# Patient Record
Sex: Female | Born: 1950 | Race: White | Hispanic: No | Marital: Married
Health system: Midwestern US, Community
[De-identification: ages and names within clinical notes are randomized; demographics above are authoritative.]

## PROBLEM LIST (undated history)

## (undated) ENCOUNTER — Ambulatory Visit: Admission: EM

## (undated) DIAGNOSIS — F419 Anxiety disorder, unspecified: Secondary | ICD-10-CM

## (undated) DIAGNOSIS — C801 Malignant (primary) neoplasm, unspecified: Secondary | ICD-10-CM

## (undated) DIAGNOSIS — F32A Depression, unspecified: Secondary | ICD-10-CM

## (undated) DIAGNOSIS — J82 Pulmonary eosinophilia, not elsewhere classified: Secondary | ICD-10-CM

## (undated) DIAGNOSIS — F329 Major depressive disorder, single episode, unspecified: Secondary | ICD-10-CM

## (undated) DIAGNOSIS — M1990 Unspecified osteoarthritis, unspecified site: Secondary | ICD-10-CM

## (undated) DIAGNOSIS — J45909 Unspecified asthma, uncomplicated: Secondary | ICD-10-CM

## (undated) DIAGNOSIS — I719 Aortic aneurysm of unspecified site, without rupture: Secondary | ICD-10-CM

## (undated) DIAGNOSIS — J8281 Chronic eosinophilic pneumonia: Secondary | ICD-10-CM

## (undated) DIAGNOSIS — F3342 Major depressive disorder, recurrent, in full remission: Secondary | ICD-10-CM

## (undated) DIAGNOSIS — E538 Deficiency of other specified B group vitamins: Secondary | ICD-10-CM

## (undated) DIAGNOSIS — F411 Generalized anxiety disorder: Secondary | ICD-10-CM

## (undated) DIAGNOSIS — M545 Low back pain, unspecified: Secondary | ICD-10-CM

## (undated) DIAGNOSIS — Z780 Asymptomatic menopausal state: Secondary | ICD-10-CM

## (undated) HISTORY — PX: ENDOMETRIAL ABLATION: SHX621

## (undated) HISTORY — PX: TONSILLECTOMY: SUR1361

---

## 2014-01-06 ENCOUNTER — Encounter

## 2015-05-08 ENCOUNTER — Encounter: Admit: 2015-05-09 | Discharge: 2015-05-09 | Payer: MEDICARE | Primary: Specialist

## 2015-05-08 ENCOUNTER — Encounter: Admit: 2015-05-08 | Discharge: 2015-05-09 | Payer: MEDICARE | Primary: Specialist

## 2015-05-09 ENCOUNTER — Encounter: Primary: Specialist

## 2015-05-10 ENCOUNTER — Encounter: Admit: 2015-05-10 | Discharge: 2015-05-10 | Payer: MEDICARE | Primary: Specialist

## 2015-05-10 ENCOUNTER — Encounter: Primary: Specialist

## 2015-05-11 ENCOUNTER — Encounter: Admit: 2015-05-11 | Discharge: 2015-05-11 | Payer: MEDICARE | Primary: Specialist

## 2015-05-19 ENCOUNTER — Encounter: Admit: 2015-05-19 | Discharge: 2015-05-19 | Payer: MEDICARE | Primary: Specialist

## 2015-05-26 ENCOUNTER — Encounter: Primary: Specialist

## 2015-05-27 ENCOUNTER — Encounter: Admit: 2015-05-27 | Discharge: 2015-05-27 | Payer: MEDICARE | Primary: Specialist

## 2015-05-31 ENCOUNTER — Encounter: Admit: 2015-05-31 | Discharge: 2015-05-31 | Payer: MEDICARE | Primary: Specialist

## 2015-06-04 ENCOUNTER — Encounter: Primary: Specialist

## 2015-06-07 ENCOUNTER — Encounter: Primary: Specialist

## 2015-06-08 ENCOUNTER — Encounter: Admit: 2015-06-08 | Discharge: 2015-06-08 | Payer: MEDICARE | Primary: Specialist

## 2015-06-12 ENCOUNTER — Encounter: Admit: 2015-06-12 | Discharge: 2015-06-13 | Payer: MEDICARE | Primary: Specialist

## 2015-06-12 DIAGNOSIS — I7102 Dissection of abdominal aorta: Secondary | ICD-10-CM

## 2015-06-13 ENCOUNTER — Inpatient Hospital Stay: Admit: 2015-06-13 | Payer: MEDICARE | Primary: Specialist

## 2015-06-13 LAB — URINALYSIS W/ RFLX MICROSCOPIC
Bilirubin: NEGATIVE
Blood: NEGATIVE
Glucose: NEGATIVE mg/dL
Ketone: NEGATIVE mg/dL
Leukocyte Esterase: NEGATIVE
Nitrites: NEGATIVE
Protein: NEGATIVE mg/dL
Specific gravity: 1.009 (ref 1.001–1.023)
Urobilinogen: 1 EU/dL (ref 0.2–1.0)
pH (UA): 7 (ref 5.0–9.0)

## 2015-06-14 ENCOUNTER — Encounter: Admit: 2015-06-14 | Discharge: 2015-06-14 | Payer: MEDICARE | Primary: Specialist

## 2015-06-14 ENCOUNTER — Encounter: Attending: Specialist | Primary: Specialist

## 2015-06-14 LAB — CULTURE, URINE
Culture result:: >100000
Culture: >100000

## 2015-06-22 ENCOUNTER — Encounter: Admit: 2015-06-22 | Discharge: 2015-06-22 | Payer: MEDICARE | Primary: Specialist

## 2015-06-29 ENCOUNTER — Encounter: Admit: 2015-06-29 | Discharge: 2015-06-29 | Payer: MEDICARE | Primary: Specialist

## 2015-07-06 ENCOUNTER — Encounter: Admit: 2015-07-06 | Discharge: 2015-07-06 | Payer: MEDICARE | Primary: Specialist

## 2015-07-15 ENCOUNTER — Ambulatory Visit
Admit: 2015-07-15 | Discharge: 2015-07-15 | Payer: PRIVATE HEALTH INSURANCE | Attending: Specialist | Primary: Specialist

## 2015-07-15 DIAGNOSIS — I71019 Dissection of thoracic aorta, unspecified: Secondary | ICD-10-CM

## 2015-07-15 LAB — AMB POC COMPLETE CBC,AUTOMATED ENTER
ABS. GRANS (POC): 4.4 10*3/uL (ref 1.4–6.5)
ABS. LYMPHS (POC): 1.4 10*3/uL (ref 1.2–3.4)
ABS. MONOS (POC): 0.3 10*3/uL (ref 0.1–0.6)
GRANULOCYTES (POC): 71.8 % (ref 42.2–75.2)
HCT (POC): 37.9 % (ref 35–60)
HGB (POC): 11 g/dL (ref 11–18)
LYMPHOCYTES (POC): 23.2 % (ref 20.5–51.1)
MCH (POC): 24.7 pg — AB (ref 27–31)
MCHC (POC): 29 g/dL — AB (ref 33–37)
MCV (POC): 85.3 fL (ref 80–99.9)
MONOCYTES (POC): 5 % (ref 1.7–9.3)
MPV (POC): 8 fL (ref 7.8–11)
PLATELET (POC): 305 10*3/uL (ref 150–450)
RBC (POC): 4.44 10*6/uL (ref 4–6)
RDW (POC): 16.4 % — AB (ref 11.6–13.7)
WBC (POC): 6.1 10*3/uL (ref 4.5–10.5)

## 2015-07-15 LAB — AMB POC URINALYSIS DIP STICK AUTO W/O MICRO
Blood (UA POC): NEGATIVE
Creatinine: 300 mg/dL
Glucose (UA POC): NEGATIVE mg/dL
Leukocyte esterase (UA POC): NEGATIVE
Nitrites (UA POC): NEGATIVE
Protein (UA POC): 30
Protein Total Urine: NEGATIVE
Protein-Creat Ratio: ABNORMAL mg/g
Specific gravity (UA POC): 1.02 (ref 1.001–1.035)
Urobilinogen (UA POC): 0.2 (ref 0.2–1)
pH (UA POC): 6 (ref 4.6–8.0)

## 2015-07-15 MED ORDER — FLUOXETINE 40 MG CAP
40 mg | ORAL_CAPSULE | Freq: Every day | ORAL | 3 refills | Status: DC
Start: 2015-07-15 — End: 2016-06-21

## 2015-07-15 MED ORDER — MONTELUKAST 10 MG TAB
10 mg | ORAL_TABLET | Freq: Every day | ORAL | 3 refills | Status: DC
Start: 2015-07-15 — End: 2016-07-06

## 2015-07-15 MED ORDER — CYANOCOBALAMIN (VIT B-12) 1,000 MCG/ML INJECTION KIT
1000 mcg/mL | INTRAMUSCULAR | 1 refills | Status: DC
Start: 2015-07-15 — End: 2016-01-18

## 2015-07-15 MED ORDER — METOPROLOL TARTRATE 25 MG TAB
25 mg | ORAL_TABLET | Freq: Two times a day (BID) | ORAL | 3 refills | Status: DC
Start: 2015-07-15 — End: 2015-09-20

## 2015-07-15 MED ORDER — DOXYCYCLINE 100 MG CAP
100 mg | ORAL_CAPSULE | Freq: Two times a day (BID) | ORAL | 0 refills | Status: DC
Start: 2015-07-15 — End: 2015-07-21

## 2015-07-15 MED ORDER — METHYLPREDNISOLONE 4 MG TABS IN A DOSE PACK
4 mg | ORAL | 0 refills | Status: DC
Start: 2015-07-15 — End: 2015-07-21

## 2015-07-15 NOTE — Progress Notes (Signed)
[facility]  Suzanne Pham, M.D.  Suzanne Pham, M.D.  Suzanne Pham, Suzanne Pham  Ph No:  [phone]  Fax:  [phone]      Chief Complaint   Patient presents with   ??? New Patient       History of Present Illness:  [name] is a 65 y.o. 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, renal involvement, stable hypertension, tolerating beta-blocker well, patient has constant pain, left anterolateral part of the ribs, previous history of rib fractures during chiropractor manipulation few years ago, patient on MS Contin for pain, reviewed the records from [facility], repeat CTA done twice showed stable completed dissection extending to the left common iliacs,, rib x-rays unremarkable, no evidence of previous fractures, patient would like to stop the pain medication,    Patient has no symptoms of chest pain, shortness of breath, strong family history of heart disease, requesting to get cardiac workup,    History of GAD/depression, stable, tolerating fluoxetine well, using Xanax on as needed basis,     history of asthma, patient has symptoms of cough, wheezing, shortness of breath, for the past few weeks, denies any fever, chills, productive sputum,    GERD/chronic constipation, patient on Colace, Zantac, denies any abdominal pain at this time.    Patient has documented B12 deficiency, on B12 supplementation,    Allergies   Allergen Reactions   ??? Aspirin Other (comments)     "Flu-like symptoms"     Past Medical History   Diagnosis Date   ??? Aorta disorder (HCC)      TYPE B   ??? Arthritis    ??? Asthma    ??? Chronic pain      Back   ??? Heart disease    ??? Osteoporosis    ??? Rib fractures      2009   ??? Vitamin B 12 deficiency    ??? Vitamin D deficiency      Past Surgical History   Procedure Laterality Date    ??? Hx bunionectomy     ??? Hx tonsil and adenoidectomy       Family History   Problem Relation Age of Onset   ??? Cancer Mother    ??? Cancer Father      Social History     Social History   ??? Marital status: MARRIED     Spouse name: N/A   ??? Number of children: N/A   ??? Years of education: N/A     Occupational History   ??? Not on file.     Social History Main Topics   ??? Smoking status: Never Smoker   ??? Smokeless tobacco: Not on file   ??? Alcohol use No   ??? Drug use: No   ??? Sexual activity: Not on file     Other Topics Concern   ??? Not on file     Social History Narrative   ??? No narrative on file     Current Outpatient Prescriptions   Medication Sig Dispense Refill   ??? methylPREDNISolone (MEDROL, PAK,) 4 mg tablet As directed 1 Dose Pack 0   ??? doxycycline (MONODOX) 100 mg capsule Take 1 Cap by mouth two (2) times a day for 10 days. 20 Cap 0   ??? metoprolol tartrate (LOPRESSOR) 25 mg tablet  Take 1 Tab by mouth two (2) times a day. Hold if BP < 100 or HR <55 180 Tab 3   ??? FLUoxetine (PROZAC) 40 mg capsule Take 1 Cap by mouth daily. 90 Cap 3   ??? montelukast (SINGULAIR) 10 mg tablet Take 1 Tab by mouth daily. 90 Tab 3   ??? cyanocobalamin, vitamin B-12, 1,000 mcg/mL kit 1 mL by Injection route every month. 30 mL 1   ??? raNITIdine (ZANTAC) 150 mg tablet Take 150 mg by mouth two (2) times a day.     ??? ALPRAZolam (XANAX) 0.5 mg tablet Take 1 mg by mouth every four (4) hours as needed for Anxiety or Sleep. pt may take 2 tabs = 1 mg at hs prn sleep or anxiety  Indications: ANXIETY     ??? morphine IR (MS IR) 15 mg tablet Take 15 mg by mouth two (2) times a day. 15 mg q 6 am   15 mg q 2 pm   and  30 mg q hs     ??? senna-docusate (PERICOLACE) 8.6-50 mg per tablet Take 2 Tabs by mouth two (2) times a day.     ??? fluticasone (FLONASE) 50 mcg/actuation nasal spray 2 Sprays by Both Nostrils route daily.     ??? budesonide-formoterol (SYMBICORT) 160-4.5 mcg/actuation HFA inhaler Take 2 Puffs by inhalation two (2) times a day.      ??? albuterol sulfate 90 mcg/actuation aepb Take 2 Puffs by inhalation every six (6) hours as needed (wheezing).           REVIEW OF SYSTEMS:  GENERAL: negative for - chills, fatigue, fever, , malaise, night sweats,  weight gain or weight loss.    EYES: negative for  - blurred vision, double vision, photophobia, pain, discharge and redness.    ENT AND MOUTH: negative for - epistaxis,   nasal discharge, nasal polyps, oral lesions, sinus pain,  sore throat, tinnitus, vertigo, visual changes or vocal changes.    CARDIOVASCULAR: negative for - chest pain, dyspnea on exertion, edema, irregular heartbeat, loss of consciousness    RESPIRATORY: Positive symptoms as above,    GASTROINTESTINAL: negative for - abdominal pain, appetite loss, blood in stools, change in bowel habits, , constipation, diarrhea, gas/bloating, heartburn, hematemesis,     GENITOURINARY: No change in urinary stream, dysuria,  genital discharge, genital ulcers, hematuria, incontinence, irregular/heavy menses, nocturia, pelvic pain, , urinary frequency/urgency or vulvar/vaginal symptoms. No flank pain.    NEUROLOGICAL: negative for - behavioral changes,   confusion, dizziness, gait disturbance, headaches, impaired coordination/balance, memory loss, numbness/tingling, seizures, speech problems, tremors, .  PSYCHIATRIC: negative for - anxiety, behavioral disorder, concentration difficulties, decreased libido, depression,  hallucinations, hostility, irritability, memory difficulties, mood swings, obsessive thoughts,     MUSCULOSKELETAL: Rib pain as above    INTEGUMENTARY (BREASTS): negative ,      ENDOCRINE: negative polydipsia, polyuria, polydypsia, cold-heat intolerance,weight loss,.    HEM/LYMPH:  bruising, bleeding, lymph node enlargement,     ALLERGY/IMMUNOLOGIC: negative,    SKIN: Negative .    PHYSICAL EXAM  General appearance - alert, well appearing, and in no distress    Mental status - alert, oriented to person, place, and time     Eyes - pupils equal and reactive, extraocular eye movements intact    Ears - bilateral TM's and external ear canals normal    Nose - normal and patent, no erythema, discharge or polyps    Neck - supple, no significant adenopathy, carotids upstroke normal bilaterally,  no bruits, thyroid exam: thyroid is normal in size without nodules or tenderness    Throat / Mouth - no erythema, no tonsillar exudate, normal dental hygiene, no ulcers    Chest -bilateral scattered wheezing, mildly tachypneic, tenderness anterolateral left ribs,    Heart - normal rate and regular rhythm, S1 and S2 normal, no gallops noted, no murmur    Abdomen - soft, nontender, nondistended, no masses or organomegaly    Back exam - full range of motion, no tenderness, palpable spasm or pain.    Neurological - alert, oriented, normal speech, no focal findings or movement disorder noted    Musculoskeletal -swelling and tenderness/first metacarpal carpal joint bilateral hand,    Extremities - peripheral pulses normal, no pedal edema, no clubbing or cyanosis    Skin - normal coloration and turgor, no rashes, no suspicious skin lesions noted      There is no immunization history on file for this patient.       LABS:   Results for orders placed or performed in visit on 37/10/62   METABOLIC PANEL, COMPREHENSIVE   Result Value Ref Range    Glucose 101 (H) 65 - 99 mg/dL    BUN 9 8 - 27 mg/dL    Creatinine 0.64 0.57 - 1.00 mg/dL    GFR est non-AA 96 >59 mL/min/1.73    GFR est AA 111 >59 mL/min/1.73    BUN/Creatinine ratio 14 11 - 26    Sodium 138 134 - 144 mmol/L    Potassium 4.0 3.5 - 5.2 mmol/L    Chloride 100 96 - 106 mmol/L    CO2 23 18 - 29 mmol/L    Calcium 9.3 8.7 - 10.3 mg/dL    Protein, total 6.3 6.0 - 8.5 g/dL    Albumin 3.8 3.6 - 4.8 g/dL    GLOBULIN, TOTAL 2.5 1.5 - 4.5 g/dL    A-G Ratio 1.5 1.1 - 2.5    Bilirubin, total 0.2 0.0 - 1.2 mg/dL    Alk. phosphatase 104 39 - 117 IU/L    AST 12 0 - 40 IU/L    ALT 11 0 - 32 IU/L   LIPID PANEL    Result Value Ref Range    Cholesterol, total 172 100 - 199 mg/dL    Triglyceride 166 (H) 0 - 149 mg/dL    HDL Cholesterol 32 (L) >39 mg/dL    VLDL, calculated 33 5 - 40 mg/dL    LDL, calculated 107 (H) 0 - 99 mg/dL   T4   Result Value Ref Range    T4, Total 8.9 4.5 - 12.0 ug/dL   TSH 3RD GENERATION   Result Value Ref Range    TSH 2.140 0.450 - 4.500 uIU/mL   AMB POC COMPLETE CBC,AUTOMATED ENTER   Result Value Ref Range    WBC (POC) 6.1 4.5 - 10.5 10^3/ul    LYMPHOCYTES (POC) 23.2 20.5 - 51.1 %    MONOCYTES (POC) 5.0 1.7 - 9.3 %    GRANULOCYTES (POC) 71.8 42.2 - 75.2 %    ABS. LYMPHS (POC) 1.4 1.2 - 3.4 10^3/ul    ABS. MONOS (POC) 0.3 0.1 - 0.6 10^3/ul    ABS. GRANS (POC) 4.4 1.4 - 6.5 10^3/ul    RBC (POC) 4.44 4 - 6 10^6/ul    HGB (POC) 11.0 11 - 18 g/dL    HCT (POC) 37.9 35 - 60 %    MCV (POC) 85.3 80 - 99.9 fL    MCH (  POC) 24.7 (A) 27 - 31 pg    MCHC (POC) 29.0 (A) 33 - 37 g/dL    RDW (POC) 16.4 (A) 11.6 - 13.7 %    PLATELET (POC) 305 150 - 450 10^3/ul    MPV (POC) 8.0 7.8 - 11 fL   AMB POC URINALYSIS DIP STICK AUTO W/O MICRO (CIM)   Result Value Ref Range    Color (UA POC) Yellow     Clarity (UA POC) Clear     Glucose (UA POC) Negative Negative mg/dL    Bilirubin (UA POC) Small Negative    Ketones (UA POC) Trace Negative    Specific gravity (UA POC) 1.020 1.001 - 1.035    Blood (UA POC) Negative Negative    pH (UA POC) 6.0 4.6 - 8.0    Protein (UA POC) 30  Negative    Urobilinogen (UA POC) 0.2 mg/dL 0.2 - 1    Nitrites (UA POC) Negative Negative    Leukocyte esterase (UA POC) Negative Negative    Protein Total Urine Negative     Creatinine 300 mg/dL    Protein-Creat Ratio abnormal mg/g         IMPRESSION    ICD-10-CM ICD-9-CM    1. Aortic dissection distal to left subclavian (HCC) I71.01 441.00 REFERRAL TO VASCULAR CENTER      AMB POC COMPLETE CBC,AUTOMATED ENTER      AMB POC URINALYSIS DIP STICK AUTO W/O MICRO (CIM)      COLLECTION VENOUS BLOOD,VENIPUNCTURE      METABOLIC PANEL, COMPREHENSIVE      LIPID PANEL       T4      TSH 3RD GENERATION   2. BMI 25.0-25.9,adult Z68.25 V85.21    3. Asthma, moderate J45.998 493.90    4. Major depressive disorder with single episode, in partial remission (HCC) F32.4 296.25 AMB POC COMPLETE CBC,AUTOMATED ENTER      AMB POC URINALYSIS DIP STICK AUTO W/O MICRO (CIM)      COLLECTION VENOUS BLOOD,VENIPUNCTURE      METABOLIC PANEL, COMPREHENSIVE      LIPID PANEL      T4      TSH 3RD GENERATION   5. Osteoporosis M81.0 733.00 AMB POC COMPLETE CBC,AUTOMATED ENTER      AMB POC URINALYSIS DIP STICK AUTO W/O MICRO (CIM)      COLLECTION VENOUS BLOOD,VENIPUNCTURE      METABOLIC PANEL, COMPREHENSIVE      LIPID PANEL      T4      TSH 3RD GENERATION   6. History of rib fracture Z87.81 V15.51    7. Vitamin B 12 deficiency E53.8 266.2    8. Chronic pain syndrome G89.4 338.4 AMB POC COMPLETE CBC,AUTOMATED ENTER      AMB POC URINALYSIS DIP STICK AUTO W/O MICRO (CIM)      COLLECTION VENOUS BLOOD,VENIPUNCTURE      METABOLIC PANEL, COMPREHENSIVE      LIPID PANEL      T4      TSH 3RD GENERATION   9. Arthralgia, unspecified joint M25.50 719.40 AMB POC COMPLETE CBC,AUTOMATED ENTER      AMB POC URINALYSIS DIP STICK AUTO W/O MICRO (CIM)      COLLECTION VENOUS BLOOD,VENIPUNCTURE      METABOLIC PANEL, COMPREHENSIVE      LIPID PANEL      T4      TSH 3RD GENERATION   10. Asthma exacerbation J45.901 493.92 methylPREDNISolone (MEDROL, PAK,) 4 mg tablet  doxycycline (MONODOX) 100 mg capsule      AMB POC COMPLETE CBC,AUTOMATED ENTER      AMB POC URINALYSIS DIP STICK AUTO W/O MICRO (CIM)      COLLECTION VENOUS BLOOD,VENIPUNCTURE      METABOLIC PANEL, COMPREHENSIVE      LIPID PANEL      T4      TSH 3RD GENERATION         PLAN  ?? 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  ??   ?? Continue fluoxetine/Xanax as directed.  ??   ?? Nonspecific rib pain, no evidence of fracture,  ??   ?? Osteopenia, no issue with compression fracture, will recheck bone density,  ??   ?? 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   ?? Continue Zantac/Colace as directed.     ??   ??       [id number] Theodoro Kos, MD

## 2015-07-16 LAB — LIPID PANEL
Cholesterol, total: 172 mg/dL (ref 100–199)
HDL Cholesterol: 32 mg/dL — ABNORMAL LOW (ref >39)
LDL, calculated: 107 mg/dL — ABNORMAL HIGH (ref 0–99)
Triglyceride: 166 mg/dL — ABNORMAL HIGH (ref 0–149)
VLDL, calculated: 33 mg/dL (ref 5–40)

## 2015-07-16 LAB — METABOLIC PANEL, COMPREHENSIVE
A-G Ratio: 1.5 (ref 1.1–2.5)
ALT (SGPT): 11 IU/L (ref 0–32)
AST (SGOT): 12 IU/L (ref 0–40)
Albumin: 3.8 g/dL (ref 3.6–4.8)
Alk. phosphatase: 104 IU/L (ref 39–117)
BUN/Creatinine ratio: 14 (ref 11–26)
BUN: 9 mg/dL (ref 8–27)
Bilirubin, total: 0.2 mg/dL (ref 0.0–1.2)
CO2: 23 mmol/L (ref 18–29)
Calcium: 9.3 mg/dL (ref 8.7–10.3)
Chloride: 100 mmol/L (ref 96–106)
Creatinine: 0.64 mg/dL (ref 0.57–1.00)
GFR est AA: 111 mL/min/{1.73_m2} (ref >59)
GFR est non-AA: 96 mL/min/{1.73_m2} (ref >59)
GLOBULIN, TOTAL: 2.5 g/dL (ref 1.5–4.5)
Glucose: 101 mg/dL — ABNORMAL HIGH (ref 65–99)
Potassium: 4 mmol/L (ref 3.5–5.2)
Protein, total: 6.3 g/dL (ref 6.0–8.5)
Sodium: 138 mmol/L (ref 134–144)

## 2015-07-16 LAB — T4 (THYROXINE): T4, Total: 8.9 ug/dL (ref 4.5–12.0)

## 2015-07-16 LAB — TSH 3RD GENERATION: TSH: 2.14 u[IU]/mL (ref 0.450–4.500)

## 2015-07-21 ENCOUNTER — Ambulatory Visit
Admit: 2015-07-21 | Discharge: 2015-07-21 | Payer: PRIVATE HEALTH INSURANCE | Attending: Specialist | Primary: Specialist

## 2015-07-21 DIAGNOSIS — J209 Acute bronchitis, unspecified: Secondary | ICD-10-CM

## 2015-07-21 MED ORDER — CEFTRIAXONE 1 GRAM SOLUTION FOR INJECTION
1 gram | Freq: Once | INTRAMUSCULAR | Status: AC
Start: 2015-07-21 — End: 2015-07-21
  Administered 2015-07-21: 17:00:00 via INTRAMUSCULAR

## 2015-07-21 MED ORDER — METHYLPREDNISOLONE 80 MG/ML SUSP FOR INJECTION
80 mg/mL | Freq: Once | INTRAMUSCULAR | Status: AC
Start: 2015-07-21 — End: 2015-07-21
  Administered 2015-07-21: 17:00:00 via INTRAMUSCULAR

## 2015-07-21 NOTE — Progress Notes (Signed)
[facility]  Campbell Riches, M.D.  Sudhirkumar C. Posey Pronto, M.D.  [place], [place]  Ph No:  [phone]  Fax:  [phone]      Chief Complaint   Patient presents with   ??? Cold Symptoms   ??? Croup       History of Present Illness:  Suzanne Pham is a 65 y.o. female that presents today for worsening of cough, wheezing, shortness of breath, patient on p.o. doxycycline, Symbicort, rescue inhalers, denies any fever, chills, positive for productive sputum, greenish in color,    History of Stanford B dissection, stable symptoms, patient has an appointment to see a physician at [facility], denies any abdominal pain, no chest pain, no shortness of breath, blood pressure is in the upper range of normal,    Stable GAD/depressive symptoms,    Allergies   Allergen Reactions   ??? Aspirin Other (comments)     "Flu-like symptoms"     Past Medical History   Diagnosis Date   ??? Aorta disorder (HCC)      TYPE B   ??? Arthritis    ??? Asthma    ??? Chronic pain      Back   ??? Heart disease    ??? Osteoporosis    ??? Rib fractures      2009   ??? Vitamin B 12 deficiency    ??? Vitamin D deficiency      Past Surgical History   Procedure Laterality Date   ??? Hx bunionectomy     ??? Hx tonsil and adenoidectomy       Family History   Problem Relation Age of Onset   ??? Cancer Mother    ??? Cancer Father      Social History     Social History   ??? Marital status: MARRIED     Spouse name: N/A   ??? Number of children: N/A   ??? Years of education: N/A     Occupational History   ??? Not on file.     Social History Main Topics   ??? Smoking status: Never Smoker   ??? Smokeless tobacco: Not on file   ??? Alcohol use No   ??? Drug use: No   ??? Sexual activity: Not on file     Other Topics Concern   ??? Not on file     Social History Narrative     Current Outpatient Prescriptions   Medication Sig Dispense Refill   ??? metoprolol tartrate (LOPRESSOR) 25 mg tablet Take 1 Tab by mouth two (2)  times a day. Hold if BP < 100 or HR <55 180 Tab 3   ??? FLUoxetine (PROZAC) 40 mg capsule Take 1 Cap by mouth daily. 90 Cap 3   ??? montelukast (SINGULAIR) 10 mg tablet Take 1 Tab by mouth daily. 90 Tab 3   ??? cyanocobalamin, vitamin B-12, 1,000 mcg/mL kit 1 mL by Injection route every month. 30 mL 1   ??? raNITIdine (ZANTAC) 150 mg tablet Take 150 mg by mouth two (2) times a day.     ??? ALPRAZolam (XANAX) 0.5 mg tablet Take 1 mg by mouth every four (4) hours as needed for Anxiety or Sleep. pt may take 2 tabs = 1 mg at hs prn sleep or anxiety  Indications: ANXIETY     ??? morphine IR (MS IR) 15 mg tablet Take 15 mg by mouth two (2) times a day. 15 mg q  6 am   15 mg q 2 pm   and  30 mg q hs     ??? senna-docusate (PERICOLACE) 8.6-50 mg per tablet Take 2 Tabs by mouth two (2) times a day.     ??? fluticasone (FLONASE) 50 mcg/actuation nasal spray 2 Sprays by Both Nostrils route daily.     ??? budesonide-formoterol (SYMBICORT) 160-4.5 mcg/actuation HFA inhaler Take 2 Puffs by inhalation two (2) times a day.     ??? albuterol sulfate 90 mcg/actuation aepb Take 2 Puffs by inhalation every six (6) hours as needed (wheezing).           REVIEW OF SYSTEMS:  GENERAL: negative for - chills, fatigue, fever, , malaise, night sweats,  weight gain or weight loss.    EYES: negative for  - blurred vision, double vision, photophobia, pain, discharge and redness.    ENT AND MOUTH: negative for - epistaxis,   nasal discharge, nasal polyps, oral lesions, sinus pain,  sore throat, tinnitus, vertigo, visual changes or vocal changes.    CARDIOVASCULAR: negative for - chest pain, dyspnea on exertion, edema, irregular heartbeat, loss of consciousness    RESPIRATORY: Positive symptoms as above  GASTROINTESTINAL: negative for - abdominal pain, appetite loss, blood in stools, change in bowel habits, , constipation, diarrhea, gas/bloating, heartburn, hematemesis,     GENITOURINARY: No change in urinary stream, dysuria,  genital discharge,  genital ulcers, hematuria, incontinence, irregular/heavy menses, nocturia, pelvic pain, , urinary frequency/urgency or vulvar/vaginal symptoms. No flank pain.    NEUROLOGICAL: negative for - behavioral changes,   confusion, dizziness, gait disturbance, headaches, impaired coordination/balance, memory loss, numbness/tingling, seizures, speech problems, tremors, .  PSYCHIATRIC: negative for - anxiety, behavioral disorder, concentration difficulties, decreased libido, depression,  hallucinations, hostility, irritability, memory difficulties, mood swings, obsessive thoughts,     MUSCULOSKELETAL: negative for - , joint pain, joint stiffness, joint swelling, muscle pain, muscle weakness.    INTEGUMENTARY (BREASTS): negative ,      ENDOCRINE: negative polydipsia, polyuria, polydypsia, cold-heat intolerance,weight loss,.    HEM/LYMPH:  bruising, bleeding, lymph node enlargement,     ALLERGY/IMMUNOLOGIC: negative,    SKIN: Negative .    PHYSICAL EXAM  General appearance - alert, well appearing, and in no distress    Mental status - alert, oriented to person, place, and time    Eyes - pupils equal and reactive, extraocular eye movements intact    Ears - bilateral TM's and external ear canals normal    Nose - normal and patent, no erythema, discharge or polyps    Neck - supple, no significant adenopathy, carotids upstroke normal bilaterally, no bruits, thyroid exam: thyroid is normal in size without nodules or tenderness    Throat / Mouth - no erythema, no tonsillar exudate, normal dental hygiene, no ulcers    Chest -bilateral scattered wheezing, patient appears tachypneic,    Heart - normal rate and regular rhythm, S1 and S2 normal, no gallops noted, no murmur    Abdomen - soft, nontender, nondistended, no masses or organomegaly    Back exam - full range of motion, no tenderness, palpable spasm or pain.    Neurological - alert, oriented, normal speech, no focal findings or movement disorder noted     Musculoskeletal - no joint tenderness, deformity or swelling    Extremities - peripheral pulses normal, no pedal edema, no clubbing or cyanosis    Skin - normal coloration and turgor, no rashes, no suspicious skin lesions noted      There  is no immunization history on file for this patient.       LABS:   Results for orders placed or performed in visit on 62/83/15   METABOLIC PANEL, COMPREHENSIVE   Result Value Ref Range    Glucose 101 (H) 65 - 99 mg/dL    BUN 9 8 - 27 mg/dL    Creatinine 0.64 0.57 - 1.00 mg/dL    GFR est non-AA 96 >59 mL/min/1.73    GFR est AA 111 >59 mL/min/1.73    BUN/Creatinine ratio 14 11 - 26    Sodium 138 134 - 144 mmol/L    Potassium 4.0 3.5 - 5.2 mmol/L    Chloride 100 96 - 106 mmol/L    CO2 23 18 - 29 mmol/L    Calcium 9.3 8.7 - 10.3 mg/dL    Protein, total 6.3 6.0 - 8.5 g/dL    Albumin 3.8 3.6 - 4.8 g/dL    GLOBULIN, TOTAL 2.5 1.5 - 4.5 g/dL    A-G Ratio 1.5 1.1 - 2.5    Bilirubin, total 0.2 0.0 - 1.2 mg/dL    Alk. phosphatase 104 39 - 117 IU/L    AST 12 0 - 40 IU/L    ALT 11 0 - 32 IU/L   LIPID PANEL   Result Value Ref Range    Cholesterol, total 172 100 - 199 mg/dL    Triglyceride 166 (H) 0 - 149 mg/dL    HDL Cholesterol 32 (L) >39 mg/dL    VLDL, calculated 33 5 - 40 mg/dL    LDL, calculated 107 (H) 0 - 99 mg/dL   T4   Result Value Ref Range    T4, Total 8.9 4.5 - 12.0 ug/dL   TSH 3RD GENERATION   Result Value Ref Range    TSH 2.140 0.450 - 4.500 uIU/mL   AMB POC COMPLETE CBC,AUTOMATED ENTER   Result Value Ref Range    WBC (POC) 6.1 4.5 - 10.5 10^3/ul    LYMPHOCYTES (POC) 23.2 20.5 - 51.1 %    MONOCYTES (POC) 5.0 1.7 - 9.3 %    GRANULOCYTES (POC) 71.8 42.2 - 75.2 %    ABS. LYMPHS (POC) 1.4 1.2 - 3.4 10^3/ul    ABS. MONOS (POC) 0.3 0.1 - 0.6 10^3/ul    ABS. GRANS (POC) 4.4 1.4 - 6.5 10^3/ul    RBC (POC) 4.44 4 - 6 10^6/ul    HGB (POC) 11.0 11 - 18 g/dL    HCT (POC) 37.9 35 - 60 %    MCV (POC) 85.3 80 - 99.9 fL    MCH (POC) 24.7 (A) 27 - 31 pg    MCHC (POC) 29.0 (A) 33 - 37 g/dL     RDW (POC) 16.4 (A) 11.6 - 13.7 %    PLATELET (POC) 305 150 - 450 10^3/ul    MPV (POC) 8.0 7.8 - 11 fL   AMB POC URINALYSIS DIP STICK AUTO W/O MICRO (CIM)   Result Value Ref Range    Color (UA POC) Yellow     Clarity (UA POC) Clear     Glucose (UA POC) Negative Negative mg/dL    Bilirubin (UA POC) Small Negative    Ketones (UA POC) Trace Negative    Specific gravity (UA POC) 1.020 1.001 - 1.035    Blood (UA POC) Negative Negative    pH (UA POC) 6.0 4.6 - 8.0    Protein (UA POC) 30  Negative    Urobilinogen (UA POC) 0.2 mg/dL 0.2 -  1    Nitrites (UA POC) Negative Negative    Leukocyte esterase (UA POC) Negative Negative    Protein Total Urine Negative     Creatinine 300 mg/dL    Protein-Creat Ratio abnormal mg/g         IMPRESSION    ICD-10-CM ICD-9-CM    1. Acute bronchitis, unspecified organism J20.9 466.0 XR CHEST PA LAT      cefTRIAXone (ROCEPHIN) injection 1 g      methylPREDNISolone acetate (DEPO-MEDROL) 80 mg/mL injection 80 mg      INHAL RX, AIRWAY OBST/DX SPUTUM INDUCT   2. Body mass index (BMI) 24.0-24.9, adult Z68.24 V85.1    3. SOB (shortness of breath) R06.02 786.05 XR CHEST PA LAT      cefTRIAXone (ROCEPHIN) injection 1 g      methylPREDNISolone acetate (DEPO-MEDROL) 80 mg/mL injection 80 mg      INHAL RX, AIRWAY OBST/DX SPUTUM INDUCT   4. Asthma exacerbation J45.901 493.92 XR CHEST PA LAT      cefTRIAXone (ROCEPHIN) injection 1 g      methylPREDNISolone acetate (DEPO-MEDROL) 80 mg/mL injection 80 mg      INHAL RX, AIRWAY OBST/DX SPUTUM INDUCT         PLAN  ?? Received pulmonary inhalation treatment, negative chest x-ray, given 1 g Rocephin IM 1 dose, Depo-Medrol 80 mg IM 1 dose, continue Symbicort, rescue nebulizer as directed.  ??   ?? 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   ?? Continue fluoxetine as directed.      [email or web address] Theodoro Kos, MD

## 2015-07-22 ENCOUNTER — Ambulatory Visit
Admit: 2015-07-22 | Discharge: 2015-07-22 | Payer: PRIVATE HEALTH INSURANCE | Attending: Specialist | Primary: Specialist

## 2015-07-22 DIAGNOSIS — J45901 Unspecified asthma with (acute) exacerbation: Secondary | ICD-10-CM

## 2015-07-22 MED ORDER — CEFIXIME 400 MG TAB
400 mg | ORAL_TABLET | Freq: Every day | ORAL | 0 refills | Status: DC
Start: 2015-07-22 — End: 2015-07-23

## 2015-07-22 MED ORDER — CEFTRIAXONE 1 GRAM SOLUTION FOR INJECTION
1 gram | Freq: Once | INTRAMUSCULAR | Status: AC
Start: 2015-07-22 — End: 2015-07-22
  Administered 2015-07-22: 16:00:00 via INTRAMUSCULAR

## 2015-07-22 NOTE — Progress Notes (Signed)
[facility]  Campbell Riches, M.D.  Sudhirkumar C. Posey Pronto, M.D.  [address]  [place], [place]  Ph No:  [phone]  Fax:  [phone]      Chief Complaint   Patient presents with   ??? Follow-up       History of Present Illness:  [name] is a 65 y.o. female that presents today for  follow-up, continued wheezing, cough, improved symptoms, good response to IM Rocephin, Depo-Medrol, tolerating Symbicort well, no fever chills, denies any nasal discharge, postnasal drip,    Allergies   Allergen Reactions   ??? Aspirin Other (comments)     "Flu-like symptoms"     Past Medical History   Diagnosis Date   ??? Aorta disorder (HCC)      TYPE B   ??? Arthritis    ??? Asthma    ??? Chronic pain      Back   ??? Heart disease    ??? Osteoporosis    ??? Rib fractures      2009   ??? Vitamin B 12 deficiency    ??? Vitamin D deficiency      Past Surgical History   Procedure Laterality Date   ??? Hx bunionectomy     ??? Hx tonsil and adenoidectomy       Family History   Problem Relation Age of Onset   ??? Cancer Mother    ??? Cancer Father      Social History     Social History   ??? Marital status: MARRIED     Spouse name: N/A   ??? Number of children: N/A   ??? Years of education: N/A     Occupational History   ??? Not on file.     Social History Main Topics   ??? Smoking status: Never Smoker   ??? Smokeless tobacco: Not on file   ??? Alcohol use No   ??? Drug use: No   ??? Sexual activity: Not on file     Other Topics Concern   ??? Not on file     Social History Narrative     Current Outpatient Prescriptions   Medication Sig Dispense Refill   ??? cefixime (SUPRAX) 400 mg tablet Take 1 Tab by mouth daily. 10 Tab 0   ??? metoprolol tartrate (LOPRESSOR) 25 mg tablet Take 1 Tab by mouth two (2) times a day. Hold if BP < 100 or HR <55 180 Tab 3   ??? FLUoxetine (PROZAC) 40 mg capsule Take 1 Cap by mouth daily. 90 Cap 3   ??? montelukast (SINGULAIR) 10 mg tablet Take 1 Tab by mouth daily. 90 Tab 3    ??? cyanocobalamin, vitamin B-12, 1,000 mcg/mL kit 1 mL by Injection route every month. 30 mL 1   ??? raNITIdine (ZANTAC) 150 mg tablet Take 150 mg by mouth two (2) times a day.     ??? ALPRAZolam (XANAX) 0.5 mg tablet Take 1 mg by mouth every four (4) hours as needed for Anxiety or Sleep. pt may take 2 tabs = 1 mg at hs prn sleep or anxiety  Indications: ANXIETY     ??? morphine IR (MS IR) 15 mg tablet Take 15 mg by mouth two (2) times a day. 15 mg q 6 am   15 mg q 2 pm   and  30 mg q hs     ??? senna-docusate (PERICOLACE) 8.6-50 mg per tablet Take 2 Tabs by mouth two (  2) times a day.     ??? fluticasone (FLONASE) 50 mcg/actuation nasal spray 2 Sprays by Both Nostrils route daily.     ??? budesonide-formoterol (SYMBICORT) 160-4.5 mcg/actuation HFA inhaler Take 2 Puffs by inhalation two (2) times a day.     ??? albuterol sulfate 90 mcg/actuation aepb Take 2 Puffs by inhalation every six (6) hours as needed (wheezing).           REVIEW OF SYSTEMS:  GENERAL: negative for - chills, fatigue, fever, , malaise, night sweats,  weight gain or weight loss.    EYES: negative for  - blurred vision, double vision, photophobia, pain, discharge and redness.    ENT AND MOUTH: negative for - epistaxis,   nasal discharge, nasal polyps, oral lesions, sinus pain,  sore throat, tinnitus, vertigo, visual changes or vocal changes.    CARDIOVASCULAR: negative for - chest pain, dyspnea on exertion, edema, irregular heartbeat, loss of consciousness    RESPIRATORY: Positive symptoms as above    GASTROINTESTINAL: negative for - abdominal pain, appetite loss, blood in stools, change in bowel habits, , constipation, diarrhea, gas/bloating, heartburn, hematemesis,     GENITOURINARY: No change in urinary stream, dysuria,  genital discharge, genital ulcers, hematuria, incontinence, irregular/heavy menses, nocturia, pelvic pain, , urinary frequency/urgency or vulvar/vaginal symptoms. No flank pain.     NEUROLOGICAL: negative for - behavioral changes,   confusion, dizziness, gait disturbance, headaches, impaired coordination/balance, memory loss, numbness/tingling, seizures, speech problems, tremors, .      PHYSICAL EXAM  General appearance - alert, well appearing, and in no distress    Mental status - alert, oriented to person, place, and time    Ears - bilateral TM's and external ear canals normal    Nose - normal and patent, no erythema, discharge or polyps    Neck - supple, no significant adenopathy, carotids upstroke normal bilaterally, no bruits, thyroid exam: thyroid is normal in size without nodules or tenderness    Throat / Mouth - no erythema, no tonsillar exudate, normal dental hygiene, no ulcers    Chest ; bilateral scattered wheezing, mild tachypnea,    Heart - normal rate and regular rhythm, S1 and S2 normal, no gallops noted, no murmur    Abdomen - soft, nontender, nondistended, no masses or organomegaly    Neurological - alert, oriented, normal speech, no focal findings or movement disorder noted    Musculoskeletal - no joint tenderness, deformity or swelling    Extremities - peripheral pulses normal, no pedal edema, no clubbing or cyanosis    Skin - normal coloration and turgor, no rashes, no suspicious skin lesions noted      There is no immunization history on file for this patient.       LABS:   Results for orders placed or performed in visit on 40/98/11   METABOLIC PANEL, COMPREHENSIVE   Result Value Ref Range    Glucose 101 (H) 65 - 99 mg/dL    BUN 9 8 - 27 mg/dL    Creatinine 0.64 0.57 - 1.00 mg/dL    GFR est non-AA 96 >59 mL/min/1.73    GFR est AA 111 >59 mL/min/1.73    BUN/Creatinine ratio 14 11 - 26    Sodium 138 134 - 144 mmol/L    Potassium 4.0 3.5 - 5.2 mmol/L    Chloride 100 96 - 106 mmol/L    CO2 23 18 - 29 mmol/L    Calcium 9.3 8.7 - 10.3 mg/dL    Protein, total  6.3 6.0 - 8.5 g/dL    Albumin 3.8 3.6 - 4.8 g/dL    GLOBULIN, TOTAL 2.5 1.5 - 4.5 g/dL    A-G Ratio 1.5 1.1 - 2.5     Bilirubin, total 0.2 0.0 - 1.2 mg/dL    Alk. phosphatase 104 39 - 117 IU/L    AST 12 0 - 40 IU/L    ALT 11 0 - 32 IU/L   LIPID PANEL   Result Value Ref Range    Cholesterol, total 172 100 - 199 mg/dL    Triglyceride 166 (H) 0 - 149 mg/dL    HDL Cholesterol 32 (L) >39 mg/dL    VLDL, calculated 33 5 - 40 mg/dL    LDL, calculated 107 (H) 0 - 99 mg/dL   T4   Result Value Ref Range    T4, Total 8.9 4.5 - 12.0 ug/dL   TSH 3RD GENERATION   Result Value Ref Range    TSH 2.140 0.450 - 4.500 uIU/mL   AMB POC COMPLETE CBC,AUTOMATED ENTER   Result Value Ref Range    WBC (POC) 6.1 4.5 - 10.5 10^3/ul    LYMPHOCYTES (POC) 23.2 20.5 - 51.1 %    MONOCYTES (POC) 5.0 1.7 - 9.3 %    GRANULOCYTES (POC) 71.8 42.2 - 75.2 %    ABS. LYMPHS (POC) 1.4 1.2 - 3.4 10^3/ul    ABS. MONOS (POC) 0.3 0.1 - 0.6 10^3/ul    ABS. GRANS (POC) 4.4 1.4 - 6.5 10^3/ul    RBC (POC) 4.44 4 - 6 10^6/ul    HGB (POC) 11.0 11 - 18 g/dL    HCT (POC) 37.9 35 - 60 %    MCV (POC) 85.3 80 - 99.9 fL    MCH (POC) 24.7 (A) 27 - 31 pg    MCHC (POC) 29.0 (A) 33 - 37 g/dL    RDW (POC) 16.4 (A) 11.6 - 13.7 %    PLATELET (POC) 305 150 - 450 10^3/ul    MPV (POC) 8.0 7.8 - 11 fL   AMB POC URINALYSIS DIP STICK AUTO W/O MICRO (CIM)   Result Value Ref Range    Color (UA POC) Yellow     Clarity (UA POC) Clear     Glucose (UA POC) Negative Negative mg/dL    Bilirubin (UA POC) Small Negative    Ketones (UA POC) Trace Negative    Specific gravity (UA POC) 1.020 1.001 - 1.035    Blood (UA POC) Negative Negative    pH (UA POC) 6.0 4.6 - 8.0    Protein (UA POC) 30  Negative    Urobilinogen (UA POC) 0.2 mg/dL 0.2 - 1    Nitrites (UA POC) Negative Negative    Leukocyte esterase (UA POC) Negative Negative    Protein Total Urine Negative     Creatinine 300 mg/dL    Protein-Creat Ratio abnormal mg/g         IMPRESSION    ICD-10-CM ICD-9-CM    1. Asthma exacerbation J45.901 493.92 cefTRIAXone (ROCEPHIN) injection 1 g      cefixime (SUPRAX) 400 mg tablet    2. Body mass index (BMI) 24.0-24.9, adult Z68.24 V85.1    3. Acute bronchitis, unspecified organism J20.9 466.0 cefTRIAXone (ROCEPHIN) injection 1 g      cefixime (SUPRAX) 400 mg tablet         PLAN  ?? Given IM Rocephin, start Suprax as directed, continue Symbicort, rescue nebulizer as directed.  ??   ?? Advised the  patient to call me if any worsening symptoms.      NTIRWERX Theodoro Kos, MD

## 2015-07-23 MED ORDER — CEFUROXIME AXETIL 500 MG TAB
500 mg | ORAL_TABLET | Freq: Two times a day (BID) | ORAL | 0 refills | Status: DC
Start: 2015-07-23 — End: 2015-11-18

## 2015-07-23 NOTE — Telephone Encounter (Signed)
Orders Placed This Encounter   ??? cefUROXime (CEFTIN) 500 mg tablet     Sig: Take 1 Tab by mouth two (2) times a day.     Dispense:  20 Tab     Refill:  0     Per dr

## 2015-07-27 NOTE — Telephone Encounter (Signed)
From: Cyril Mourning  Sent: 07/26/2015 4:46 PM EST  Subject:  Questionnaire Submission    Patient  Questionnaire Submission  --------------------------------    Questionnaire:  After Visit Follow-Up (Optional)    Question:  Did you understand your instructions on your After Visit Summary (AVS). To view your AVS from your recent visit, click here.   Answer:  Yes    Question:  If no, please type your question here.  Answer:      Question:  Do you have any questions about your treatment plan?  Answer:  No    Question:  If yes, please type your question here.  Answer:      Question:  If there were medications prescribed during your visit, do you have questions?  Answer:  No    Question:  If yes, please type your question here.  Answer:      Question:  Did you schedule your follow-up office visit or diagnostic testing?  Answer:  Yes    Question:  Did we meet your expectations during this visit?  Answer:  Yes    Question:  If not, can a member of your care team call you to discuss?  Answer:      -----  Message -----    From: Uvaldo Bristle, MD    Sent: 07/16/2015 7:50 AM    To: Cyril Mourning  Subject:  MyChart After Visit Follow-Up Message.     Hello  Ms. Noreene Filbert,    Thank  you for your recent visit to [facility].  Please  take a moment to complete a brief survey regarding your visit.      Sincerely,    Ssa  [facility]

## 2015-07-30 ENCOUNTER — Ambulatory Visit
Admit: 2015-07-30 | Discharge: 2015-07-30 | Payer: PRIVATE HEALTH INSURANCE | Attending: Specialist | Primary: Specialist

## 2015-07-30 DIAGNOSIS — J209 Acute bronchitis, unspecified: Secondary | ICD-10-CM

## 2015-07-30 NOTE — Progress Notes (Signed)
[facility]  Campbell Riches, M.D.  Sudhirkumar C. Posey Pronto, M.D.  [address]  [place], [place]  Ph No:  [phone]  Fax:  [phone]      Chief Complaint   Patient presents with   ??? Follow-up     2 weeks with Lab Results       History of Present Illness:  Suzanne Pham is a 65 y.o. female that presents today for follow-up, improved cough, wheezing, shortness of breath, completed p.o. 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 Aspirin Other (comments)     "Flu-like symptoms"   ??? Cymbalta [Duloxetine] Other (comments)     pain   ??? Elavil Other (comments)     pain   ??? Forteo [Teriparatide] Other (comments)   ??? Lyrica [Pregabalin] Other (comments)     Hallucinations and falling   ??? Prolia [Denosumab] Rash   ??? Topamax [Topiramate] Other (comments)     Slurred speach   ??? Ultram [Tramadol] Drowsiness     Past Medical History   Diagnosis Date   ??? Aorta disorder (HCC)      TYPE B   ??? Arthritis    ??? Asthma    ??? Chronic pain      Back   ??? Heart disease    ??? Osteoporosis    ??? Rib fractures      2009   ??? Vitamin B 12 deficiency    ??? Vitamin D deficiency      Past Surgical History   Procedure Laterality Date   ??? Hx bunionectomy     ??? Hx tonsil and adenoidectomy       Family History   Problem Relation Age of Onset   ??? Cancer Mother    ??? Cancer Father      Social History     Social History   ??? Marital status: MARRIED     Spouse name: N/A   ??? Number of children: N/A   ??? Years of education: N/A     Occupational History   ??? Not on file.     Social History Main Topics   ??? Smoking status: Never Smoker   ??? Smokeless tobacco: Not on file   ??? Alcohol use No   ??? Drug use: No   ??? Sexual activity: Not on file     Other Topics Concern   ??? Not on file     Social History Narrative     Current Outpatient Prescriptions    Medication Sig Dispense Refill   ??? cefUROXime (CEFTIN) 500 mg tablet Take 1 Tab by mouth two (2) times a day. 20 Tab 0   ??? metoprolol tartrate (LOPRESSOR) 25 mg tablet Take 1 Tab by mouth two (2) times a day. Hold if BP < 100 or HR <55 180 Tab 3   ??? FLUoxetine (PROZAC) 40 mg capsule Take 1 Cap by mouth daily. 90 Cap 3   ??? montelukast (SINGULAIR) 10 mg tablet Take 1 Tab by mouth daily. 90 Tab 3   ??? cyanocobalamin, vitamin B-12, 1,000 mcg/mL kit 1 mL by Injection route every month. 30 mL 1   ??? raNITIdine (ZANTAC) 150 mg tablet Take 150 mg by mouth two (2) times a day.     ??? ALPRAZolam Duanne Moron)  0.5 mg tablet Take 1 mg by mouth every four (4) hours as needed for Anxiety or Sleep. pt may take 2 tabs = 1 mg at hs prn sleep or anxiety  Indications: ANXIETY     ??? senna-docusate (PERICOLACE) 8.6-50 mg per tablet Take 2 Tabs by mouth two (2) times a day.     ??? fluticasone (FLONASE) 50 mcg/actuation nasal spray 2 Sprays by Both Nostrils route daily.     ??? budesonide-formoterol (SYMBICORT) 160-4.5 mcg/actuation HFA inhaler Take 2 Puffs by inhalation two (2) times a day.     ??? albuterol sulfate 90 mcg/actuation aepb Take 2 Puffs by inhalation every six (6) hours as needed (wheezing).           REVIEW OF SYSTEMS:  GENERAL: negative for - chills, fatigue, fever, , malaise, night sweats,  weight gain or weight loss.    EYES: negative for  - blurred vision, double vision, photophobia, pain, discharge and redness.    ENT AND MOUTH: negative for - epistaxis,   nasal discharge, nasal polyps, oral lesions, sinus pain,  sore throat, tinnitus, vertigo, visual changes or vocal changes.    CARDIOVASCULAR: negative for - chest pain, dyspnea on exertion, edema, irregular heartbeat, loss of consciousness    RESPIRATORY:  negative for - cough, hemoptysis,, pleuritic pain, shortness of breath, sputum changes, , tachypnea or wheezing.    GASTROINTESTINAL: negative for - abdominal pain, appetite loss, blood in  stools, change in bowel habits, , constipation, diarrhea, gas/bloating, heartburn, hematemesis,     GENITOURINARY: No change in urinary stream, dysuria,  genital discharge, genital ulcers, hematuria, incontinence, irregular/heavy menses, nocturia, pelvic pain, , urinary frequency/urgency or vulvar/vaginal symptoms. No flank pain.        PHYSICAL EXAM  General appearance - alert, well appearing, and in no distress    Mental status - alert, oriented to person, place, and time    Ears - bilateral TM's and external ear canals normal    Nose - normal and patent, no erythema, discharge or polyps    Neck - supple, no significant adenopathy, carotids upstroke normal bilaterally, no bruits, thyroid exam: thyroid is normal in size without nodules or tenderness    Throat / Mouth - no erythema, no tonsillar exudate, normal dental hygiene, no ulcers    Chest - clear to auscultation, no wheezes, rales or rhonchi, symmetric air entry    Heart - normal rate and regular rhythm, S1 and S2 normal, no gallops noted, no murmur    Abdomen - soft, nontender, nondistended, no masses or organomegaly    Neurological - alert, oriented, normal speech, no focal findings or movement disorder noted    Musculoskeletal - no joint tenderness, deformity or swelling    Extremities - peripheral pulses normal, no pedal edema, no clubbing or cyanosis    Skin - normal coloration and turgor, no rashes, no suspicious skin lesions noted      There is no immunization history on file for this patient.       LABS:   Results for orders placed or performed in visit on 94/85/46   METABOLIC PANEL, COMPREHENSIVE   Result Value Ref Range    Glucose 101 (H) 65 - 99 mg/dL    BUN 9 8 - 27 mg/dL    Creatinine 0.64 0.57 - 1.00 mg/dL    GFR est non-AA 96 >59 mL/min/1.73    GFR est AA 111 >59 mL/min/1.73    BUN/Creatinine ratio 14 11 - 26  Sodium 138 134 - 144 mmol/L    Potassium 4.0 3.5 - 5.2 mmol/L    Chloride 100 96 - 106 mmol/L    CO2 23 18 - 29 mmol/L     Calcium 9.3 8.7 - 10.3 mg/dL    Protein, total 6.3 6.0 - 8.5 g/dL    Albumin 3.8 3.6 - 4.8 g/dL    GLOBULIN, TOTAL 2.5 1.5 - 4.5 g/dL    A-G Ratio 1.5 1.1 - 2.5    Bilirubin, total 0.2 0.0 - 1.2 mg/dL    Alk. phosphatase 104 39 - 117 IU/L    AST 12 0 - 40 IU/L    ALT 11 0 - 32 IU/L   LIPID PANEL   Result Value Ref Range    Cholesterol, total 172 100 - 199 mg/dL    Triglyceride 166 (H) 0 - 149 mg/dL    HDL Cholesterol 32 (L) >39 mg/dL    VLDL, calculated 33 5 - 40 mg/dL    LDL, calculated 107 (H) 0 - 99 mg/dL   T4   Result Value Ref Range    T4, Total 8.9 4.5 - 12.0 ug/dL   TSH 3RD GENERATION   Result Value Ref Range    TSH 2.140 0.450 - 4.500 uIU/mL   AMB POC COMPLETE CBC,AUTOMATED ENTER   Result Value Ref Range    WBC (POC) 6.1 4.5 - 10.5 10^3/ul    LYMPHOCYTES (POC) 23.2 20.5 - 51.1 %    MONOCYTES (POC) 5.0 1.7 - 9.3 %    GRANULOCYTES (POC) 71.8 42.2 - 75.2 %    ABS. LYMPHS (POC) 1.4 1.2 - 3.4 10^3/ul    ABS. MONOS (POC) 0.3 0.1 - 0.6 10^3/ul    ABS. GRANS (POC) 4.4 1.4 - 6.5 10^3/ul    RBC (POC) 4.44 4 - 6 10^6/ul    HGB (POC) 11.0 11 - 18 g/dL    HCT (POC) 37.9 35 - 60 %    MCV (POC) 85.3 80 - 99.9 fL    MCH (POC) 24.7 (A) 27 - 31 pg    MCHC (POC) 29.0 (A) 33 - 37 g/dL    RDW (POC) 16.4 (A) 11.6 - 13.7 %    PLATELET (POC) 305 150 - 450 10^3/ul    MPV (POC) 8.0 7.8 - 11 fL   AMB POC URINALYSIS DIP STICK AUTO W/O MICRO (CIM)   Result Value Ref Range    Color (UA POC) Yellow     Clarity (UA POC) Clear     Glucose (UA POC) Negative Negative mg/dL    Bilirubin (UA POC) Small Negative    Ketones (UA POC) Trace Negative    Specific gravity (UA POC) 1.020 1.001 - 1.035    Blood (UA POC) Negative Negative    pH (UA POC) 6.0 4.6 - 8.0    Protein (UA POC) 30  Negative    Urobilinogen (UA POC) 0.2 mg/dL 0.2 - 1    Nitrites (UA POC) Negative Negative    Leukocyte esterase (UA POC) Negative Negative    Protein Total Urine Negative     Creatinine 300 mg/dL    Protein-Creat Ratio abnormal mg/g         IMPRESSION     ICD-10-CM ICD-9-CM    1. Acute bronchitis, unspecified organism J20.9 466.0    2. Aortic dissection distal to left subclavian (HCC) I71.01 441.00          PLAN  ?? Resolved acute bronchitis, continue rescue inhalers as directed,  stable asthma symptoms, continue maintenance inhaler as directed.  ??   ?? 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      [id number] Theodoro Kos, MD

## 2015-09-20 MED ORDER — METOPROLOL TARTRATE 25 MG TAB
25 mg | ORAL_TABLET | Freq: Three times a day (TID) | ORAL | 1 refills | Status: DC
Start: 2015-09-20 — End: 2015-11-16

## 2015-09-20 MED ORDER — ALPRAZOLAM 1 MG TAB
1 mg | ORAL_TABLET | Freq: Three times a day (TID) | ORAL | 0 refills | Status: DC | PRN
Start: 2015-09-20 — End: 2015-11-16

## 2015-09-20 MED ORDER — ATORVASTATIN 10 MG TAB
10 mg | ORAL_TABLET | Freq: Every day | ORAL | 1 refills | Status: DC
Start: 2015-09-20 — End: 2015-11-16

## 2015-09-28 NOTE — Telephone Encounter (Signed)
09/28/15 1:50 pm - Received incomplete & illegible faxes from Albesa SeenVickie Crabtree @ [facility] for DOS ?09/08/15 and ?09/10/15 (maybe more?) with Volney AmericanMelissa Merrill Menacho, NP of Dr. Terance HartJohn Hughes's office.  LM on VM of Albesa SeenVickie Crabtree [phone] to please call & fax again.

## 2015-09-30 ENCOUNTER — Encounter: Attending: Specialist | Primary: Specialist

## 2015-11-16 ENCOUNTER — Ambulatory Visit
Admit: 2015-11-16 | Discharge: 2015-11-16 | Payer: PRIVATE HEALTH INSURANCE | Attending: Specialist | Primary: Specialist

## 2015-11-16 ENCOUNTER — Inpatient Hospital Stay
Admit: 2015-11-16 | Discharge: 2015-11-16 | Disposition: A | Discharge status: Home / Self Care | Payer: MEDICARE | Attending: Emergency Medicine

## 2015-11-16 ENCOUNTER — Emergency Department: Admit: 2015-11-16 | Payer: MEDICARE | Primary: Specialist

## 2015-11-16 DIAGNOSIS — R072 Precordial pain: Secondary | ICD-10-CM

## 2015-11-16 DIAGNOSIS — R0789 Other chest pain: Secondary | ICD-10-CM

## 2015-11-16 LAB — POC TROPONIN
Troponin-I (POC): 0.01 ng/ml (ref 0.0–0.08)
Troponin-I (POC): 0 ng/ml (ref 0.0–0.08)

## 2015-11-16 LAB — CBC WITH AUTOMATED DIFF
ABS. BASOPHILS: 0 10*3/uL (ref 0.0–0.2)
ABS. EOSINOPHILS: 1.1 10*3/uL — ABNORMAL HIGH (ref 0.0–0.8)
ABS. IMM. GRANS.: 0 10*3/uL (ref 0.0–0.5)
ABS. LYMPHOCYTES: 2.5 10*3/uL (ref 0.5–4.6)
ABS. MONOCYTES: 0.4 10*3/uL (ref 0.1–1.3)
ABS. NEUTROPHILS: 3.3 10*3/uL (ref 1.7–8.2)
BASOPHILS: 0 % (ref 0.0–2.0)
EOSINOPHILS: 15 % — ABNORMAL HIGH (ref 0.5–7.8)
HCT: 37.7 % (ref 35.8–46.3)
HGB: 12.5 g/dL (ref 11.7–15.4)
IMMATURE GRANULOCYTES: 0.5 % (ref 0.0–5.0)
LYMPHOCYTES: 34 % (ref 13–44)
MCH: 28.9 PG (ref 26.1–32.9)
MCHC: 33.2 g/dL (ref 31.4–35.0)
MCV: 87.3 FL (ref 79.6–97.8)
MONOCYTES: 6 % (ref 4.0–12.0)
MPV: 10.5 FL — ABNORMAL LOW (ref 10.8–14.1)
NEUTROPHILS: 45 % (ref 43–78)
PLATELET: 250 10*3/uL (ref 150–450)
RBC: 4.32 M/uL (ref 4.05–5.25)
RDW: 13.9 % (ref 11.9–14.6)
WBC: 7.4 10*3/uL (ref 4.3–11.1)

## 2015-11-16 LAB — METABOLIC PANEL, COMPREHENSIVE
A-G Ratio: 1.1 — ABNORMAL LOW (ref 1.2–3.5)
ALT (SGPT): 39 U/L (ref 12–65)
AST (SGOT): 28 U/L (ref 15–37)
Albumin: 3.6 g/dL (ref 3.2–4.6)
Alk. phosphatase: 94 U/L (ref 50–136)
Anion gap: 8 mmol/L (ref 7–16)
BUN: 18 MG/DL (ref 8–23)
Bilirubin, total: 0.3 MG/DL (ref 0.2–1.1)
CO2: 28 mmol/L (ref 21–32)
Calcium: 9.3 MG/DL (ref 8.3–10.4)
Chloride: 109 mmol/L — ABNORMAL HIGH (ref 98–107)
Creatinine: 0.67 MG/DL (ref 0.6–1.0)
GFR est AA: >60 mL/min/{1.73_m2} (ref >60)
GFR est non-AA: >60 mL/min/{1.73_m2} (ref >60)
Globulin: 3.4 g/dL (ref 2.3–3.5)
Glucose: 94 mg/dL (ref 65–100)
Potassium: 4.1 mmol/L (ref 3.5–5.1)
Protein, total: 7 g/dL (ref 6.3–8.2)
Sodium: 145 mmol/L (ref 136–145)

## 2015-11-16 LAB — PHOSPHORUS: Phosphorus: 3.1 MG/DL (ref 2.3–3.7)

## 2015-11-16 LAB — EKG, 12 LEAD, INITIAL
Atrial Rate: 61 {beats}/min
Calculated P Axis: 50 degrees
Calculated R Axis: 35 degrees
Calculated T Axis: 40 degrees
P-R Interval: 180 ms
Q-T Interval: 432 ms
QRS Duration: 90 ms
QTC Calculation (Bezet): 434 ms
Ventricular Rate: 61 {beats}/min

## 2015-11-16 LAB — BNP: BNP: 69 pg/mL

## 2015-11-16 LAB — MAGNESIUM: Magnesium: 2.2 mg/dL (ref 1.8–2.4)

## 2015-11-16 MED ORDER — ALPRAZOLAM 1 MG TAB
1 mg | ORAL_TABLET | Freq: Three times a day (TID) | ORAL | 5 refills | Status: DC | PRN
Start: 2015-11-16 — End: 2016-07-11

## 2015-11-16 MED ORDER — SODIUM CHLORIDE 0.9% BOLUS IV
0.9 % | Freq: Once | INTRAVENOUS | Status: AC
Start: 2015-11-16 — End: 2015-11-16
  Administered 2015-11-16: 17:00:00 via INTRAVENOUS

## 2015-11-16 MED ORDER — METOPROLOL TARTRATE 25 MG TAB
25 mg | ORAL_TABLET | Freq: Three times a day (TID) | ORAL | 4 refills | Status: DC
Start: 2015-11-16 — End: 2017-02-13

## 2015-11-16 MED ORDER — SODIUM CHLORIDE 0.9% BOLUS IV
0.9 % | Freq: Once | INTRAVENOUS | Status: AC
Start: 2015-11-16 — End: 2015-11-16
  Administered 2015-11-16: 16:00:00 via INTRAVENOUS

## 2015-11-16 MED ORDER — SALINE PERIPHERAL FLUSH PRN
Freq: Once | INTRAMUSCULAR | Status: AC
Start: 2015-11-16 — End: 2015-11-16
  Administered 2015-11-16: 17:00:00

## 2015-11-16 MED ORDER — ATORVASTATIN 10 MG TAB
10 mg | ORAL_TABLET | Freq: Every day | ORAL | 4 refills | Status: DC
Start: 2015-11-16 — End: 2016-07-11

## 2015-11-16 MED ORDER — IOPAMIDOL 76 % IV SOLN
370 mg iodine /mL (76 %) | Freq: Once | INTRAVENOUS | Status: AC
Start: 2015-11-16 — End: 2015-11-16
  Administered 2015-11-16: 17:00:00 via INTRAVENOUS

## 2015-11-16 NOTE — Progress Notes (Signed)
[facility]  Suzanne Pham, M.D.  Suzanne Pham, M.D.  [place], [place]  Ph No:  [phone]  Fax:  [phone]      Chief Complaint   Patient presents with   ??? Other     chest pressure    ??? Fatigue   ??? Cough     x's 2wks       History of Present Illness:  Suzanne Pham is a 65 y.o. 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        Allergies   Allergen Reactions   ??? Aspirin Other (comments)     "Flu-like symptoms"   ??? Cymbalta [Duloxetine] Other (comments)     pain   ??? Elavil Other (comments)     pain   ??? Forteo [Teriparatide] Other (comments)   ??? Lyrica [Pregabalin] Other (comments)     Hallucinations and falling   ??? Prolia [Denosumab] Rash   ??? Topamax [Topiramate] Other (comments)     Slurred speach   ??? Ultram [Tramadol] Drowsiness     Past Medical History:   Diagnosis Date   ??? Aorta disorder (HCC)     TYPE B   ??? Arthritis    ??? Asthma    ??? Chronic pain     Back   ??? Heart disease    ??? Osteoporosis    ??? Rib fractures     2009   ??? Vitamin B 12 deficiency    ??? Vitamin D deficiency      Past Surgical History:   Procedure Laterality Date   ??? HX BUNIONECTOMY     ??? HX TONSIL AND ADENOIDECTOMY       Family History   Problem Relation Age of Onset   ??? Cancer Mother    ??? Cancer Father      Social History     Social History    ??? Marital status: MARRIED     Spouse name: N/A   ??? Number of children: N/A   ??? Years of education: N/A     Occupational History   ??? Not on file.     Social History Main Topics   ??? Smoking status: Never Smoker   ??? Smokeless tobacco: Not on file   ??? Alcohol use No   ??? Drug use: No   ??? Sexual activity: Not on file     Other Topics Concern   ??? Not on file     Social History Narrative     Current Outpatient Prescriptions   Medication Sig Dispense Refill   ??? ALPRAZolam (XANAX) 1 mg tablet Take 1 Tab by mouth three (3) times daily as needed for Anxiety or Sleep. Max Daily Amount: 3 mg. Indications: anxiety 60 Tab 5   ??? atorvastatin (LIPITOR) 10 mg tablet Take 1 Tab by mouth daily. 90 Tab 4   ??? metoprolol tartrate (LOPRESSOR)  25 mg tablet Take 1 Tab by mouth three (3) times daily. Hold if BP < 100 or HR <55 270 Tab 4   ??? cefUROXime (CEFTIN) 500 mg tablet Take 1 Tab by mouth two (2) times a day. 20 Tab 0   ??? FLUoxetine (PROZAC) 40 mg capsule Take 1 Cap by mouth daily. 90 Cap 3   ??? montelukast (SINGULAIR) 10 mg tablet Take 1 Tab by mouth daily. 90 Tab 3   ??? cyanocobalamin, vitamin B-12, 1,000 mcg/mL kit 1 mL by Injection route every month. 30 mL 1   ??? raNITIdine (ZANTAC) 150 mg tablet Take 150 mg by mouth two (2) times a day.     ??? fluticasone (FLONASE) 50 mcg/actuation nasal spray 2 Sprays by Both Nostrils route daily.     ??? budesonide-formoterol (SYMBICORT) 160-4.5 mcg/actuation HFA inhaler Take 2 Puffs by inhalation two (2) times a day.     ??? albuterol sulfate 90 mcg/actuation aepb Take 2 Puffs by inhalation every six (6) hours as needed (wheezing).       Facility-Administered Medications Ordered in Other Visits   Medication Dose Route Frequency Provider Last Rate Last Dose   ??? sodium chloride 0.9 % bolus infusion 1,000 mL  1,000 mL IntraVENous ONCE Ky Barban, MD             REVIEW OF SYSTEMS:  GENERAL: negative for - chills, fatigue, fever, , malaise, night sweats,  weight gain or weight loss.     EYES: negative for  - blurred vision, double vision, photophobia, pain, discharge and redness.    ENT AND MOUTH: negative for - epistaxis,   nasal discharge, nasal polyps, oral lesions, sinus pain,  sore throat, tinnitus, vertigo, visual changes or vocal changes.    CARDIOVASCULAR: Positive symptoms as above    RESPIRATORY: Positive symptoms as above  GASTROINTESTINAL: negative for - abdominal pain, appetite loss, blood in stools, change in bowel habits, , constipation, diarrhea, gas/bloating, heartburn, hematemesis,     GENITOURINARY: No change in urinary stream, dysuria,  genital discharge, genital ulcers, hematuria, incontinence, irregular/heavy menses, nocturia, pelvic pain, , urinary frequency/urgency or vulvar/vaginal symptoms. No flank pain.    NEUROLOGICAL: negative for - behavioral changes,   confusion, dizziness, gait disturbance, headaches, impaired coordination/balance, memory loss, numbness/tingling, seizures, speech problems, tremors, .  PSYCHIATRIC: negative for - anxiety, behavioral disorder, concentration difficulties, decreased libido, depression,  hallucinations, hostility, irritability, memory difficulties, mood swings, obsessive thoughts,     MUSCULOSKELETAL: negative for - , joint pain, joint stiffness, joint swelling, muscle pain, muscle weakness.    INTEGUMENTARY (BREASTS): negative ,      ENDOCRINE: negative polydipsia, polyuria, polydypsia, cold-heat intolerance,weight loss,.    HEM/LYMPH:  bruising, bleeding, lymph node enlargement,     ALLERGY/IMMUNOLOGIC: negative,    SKIN: Negative .    PHYSICAL EXAM  General appearance - alert, well appearing, and in no distress    Mental status - alert, oriented to person, place, and time    Eyes - pupils equal and reactive, extraocular eye movements intact    Ears - bilateral TM's and external ear canals normal    Nose - normal and patent, no erythema, discharge or polyps    Neck - supple, no significant adenopathy, carotids upstroke normal  bilaterally, no bruits, thyroid exam: thyroid is normal in size without nodules or tenderness    Throat / Mouth - no erythema, no tonsillar exudate, normal dental hygiene, no ulcers    Chest - clear to auscultation, no  wheezes, rales or rhonchi, symmetric air entry, nonspecific tenderness noted on the ribs left anteriorly,    Heart - normal rate and regular rhythm, S1 and S2 normal, no gallops noted, no murmur    Abdomen - soft, nontender, nondistended, no masses or organomegaly    Back exam - full range of motion, no tenderness, palpable spasm or pain.    Neurological - alert, oriented, normal speech, no focal findings or movement disorder noted    Musculoskeletal - no joint tenderness, deformity or swelling    Extremities - peripheral pulses normal, no pedal edema, no clubbing or cyanosis    Skin - normal coloration and turgor, no rashes, no suspicious skin lesions noted      There is no immunization history on file for this patient.       LABS:   Results for orders placed or performed in visit on 46/96/29   METABOLIC PANEL, COMPREHENSIVE   Result Value Ref Range    Glucose 101 (H) 65 - 99 mg/dL    BUN 9 8 - 27 mg/dL    Creatinine 0.64 0.57 - 1.00 mg/dL    GFR est non-AA 96 >59 mL/min/1.73    GFR est AA 111 >59 mL/min/1.73    BUN/Creatinine ratio 14 11 - 26    Sodium 138 134 - 144 mmol/L    Potassium 4.0 3.5 - 5.2 mmol/L    Chloride 100 96 - 106 mmol/L    CO2 23 18 - 29 mmol/L    Calcium 9.3 8.7 - 10.3 mg/dL    Protein, total 6.3 6.0 - 8.5 g/dL    Albumin 3.8 3.6 - 4.8 g/dL    GLOBULIN, TOTAL 2.5 1.5 - 4.5 g/dL    A-G Ratio 1.5 1.1 - 2.5    Bilirubin, total 0.2 0.0 - 1.2 mg/dL    Alk. phosphatase 104 39 - 117 IU/L    AST (SGOT) 12 0 - 40 IU/L    ALT (SGPT) 11 0 - 32 IU/L   LIPID PANEL   Result Value Ref Range    Cholesterol, total 172 100 - 199 mg/dL    Triglyceride 166 (H) 0 - 149 mg/dL    HDL Cholesterol 32 (L) >39 mg/dL    VLDL, calculated 33 5 - 40 mg/dL    LDL, calculated 107 (H) 0 - 99 mg/dL   T4    Result Value Ref Range    T4, Total 8.9 4.5 - 12.0 ug/dL   TSH 3RD GENERATION   Result Value Ref Range    TSH 2.140 0.450 - 4.500 uIU/mL   AMB POC COMPLETE CBC,AUTOMATED ENTER   Result Value Ref Range    WBC (POC) 6.1 4.5 - 10.5 10^3/ul    LYMPHOCYTES (POC) 23.2 20.5 - 51.1 %    MONOCYTES (POC) 5.0 1.7 - 9.3 %    GRANULOCYTES (POC) 71.8 42.2 - 75.2 %    ABS. LYMPHS (POC) 1.4 1.2 - 3.4 10^3/ul    ABS. MONOS (POC) 0.3 0.1 - 0.6 10^3/ul    ABS. GRANS (POC) 4.4 1.4 - 6.5 10^3/ul    RBC (POC) 4.44 4 - 6 10^6/ul    HGB (POC) 11.0 11 - 18 g/dL    HCT (POC) 37.9 35 - 60 %    MCV (POC) 85.3 80 - 99.9 fL    MCH (POC) 24.7 (A) 27 - 31 pg    MCHC (POC) 29.0 (A) 33 - 37 g/dL    RDW (POC) 16.4 (A) 11.6 -  13.7 %    PLATELET (POC) 305 150 - 450 10^3/ul    MPV (POC) 8.0 7.8 - 11 fL   AMB POC URINALYSIS DIP STICK AUTO W/O MICRO (CIM)   Result Value Ref Range    Color (UA POC) Yellow     Clarity (UA POC) Clear     Glucose (UA POC) Negative Negative mg/dL    Bilirubin (UA POC) Small Negative    Ketones (UA POC) Trace Negative    Specific gravity (UA POC) 1.020 1.001 - 1.035    Blood (UA POC) Negative Negative    pH (UA POC) 6.0 4.6 - 8.0    Protein (UA POC) 30  Negative    Urobilinogen (UA POC) 0.2 mg/dL 0.2 - 1    Nitrites (UA POC) Negative Negative    Leukocyte esterase (UA POC) Negative Negative    Protein Total Urine Negative     Creatinine 300 mg/dL    Protein-Creat Ratio abnormal mg/g         IMPRESSION    ICD-10-CM ICD-9-CM    1. Precordial pain R07.2 786.51 AMB POC EKG ROUTINE W/ 12 LEADS, INTER & REP      INHAL RX, AIRWAY OBST/DX SPUTUM INDUCT      XR CHEST PA LAT   2. BMI 27.0-27.9,adult Z68.27 V85.23    3. Aortic dissection distal to left subclavian (HCC) I71.01 441.00 AMB POC EKG ROUTINE W/ 12 LEADS, INTER & REP      INHAL RX, AIRWAY OBST/DX SPUTUM INDUCT      XR CHEST PA LAT   4. Chronic pain syndrome G89.4 338.4 AMB POC EKG ROUTINE W/ 12 LEADS, INTER & REP      INHAL RX, AIRWAY OBST/DX SPUTUM INDUCT      XR CHEST PA LAT    5. History of asthma Z87.09 V12.69          PLAN  ?? Ischemic chest pain, no response to sublingual nitro, strong family history of heart disease, history of aortic dissection Stanford B in the past, EKG unremarkable, discussed with ER physician, sent to ER via EMS, for further workup,  ??   ?? Continue beta-blocker as directed.  ??   ?? History of asthma, continue Symbicort, rescue inhalers as directed.      [id number] Theodoro Kos, MD

## 2015-11-16 NOTE — ED Provider Notes (Signed)
HPI Comments: 65 year old female presents from her primary care doctor's office today with reports of increasing fatigue.  Patient's medical history significant for a dissection of her thoracoabdominal aorta    Patient is here today with several weeks of persisting and worsening fatigue and sleepiness.  She does report having had multiple stressors, traveling to help care for family members with other medical problems as well.    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  There her last CAT scan, an echo were stable    Patient does report more than 2-3 weeks of heaviness in her left shoulder and arm    Patient is a 65 y.o. female presenting with lethargy. The history is provided by the patient.   Lethargy   This is a recurrent problem. The current episode started more than 1 week ago. The problem occurs constantly. The problem has been gradually worsening. Pertinent negatives include no chest pain, no abdominal pain, no headaches and no shortness of breath. The symptoms are aggravated by exertion. The symptoms are relieved by rest. She has tried nothing for the symptoms.        Past Medical History:   Diagnosis Date   ??? Aorta disorder (HCC)     TYPE B   ??? Arthritis    ??? Asthma    ??? Chronic pain     Back   ??? Heart disease    ??? Osteoporosis    ??? Rib fractures     2009   ??? Vitamin B 12 deficiency    ??? Vitamin D deficiency        Past Surgical History:   Procedure Laterality Date   ??? HX BUNIONECTOMY     ??? HX TONSIL AND ADENOIDECTOMY           Family History:   Problem Relation Age of Onset   ??? Cancer Mother    ??? Cancer Father        Social History     Social History   ??? Marital status: MARRIED     Spouse name: N/A   ??? Number of children: N/A   ??? Years of education: N/A     Occupational History   ??? Not on file.     Social History Main Topics   ??? Smoking status: Never Smoker   ??? Smokeless tobacco: Not on file   ??? Alcohol use No   ??? Drug use: No   ??? Sexual activity: Not on file     Other Topics Concern    ??? Not on file     Social History Narrative         ALLERGIES: Aspirin; Cymbalta [duloxetine]; Elavil; Forteo [teriparatide]; Lyrica [pregabalin]; Prolia [denosumab]; Topamax [topiramate]; and Ultram [tramadol]    Review of Systems   Constitutional: Positive for fatigue. Negative for chills, diaphoresis and fever.   HENT: Negative for congestion, ear pain, nosebleeds, postnasal drip, rhinorrhea, sore throat and trouble swallowing.    Eyes: Negative for photophobia and discharge.   Respiratory: Negative for cough, shortness of breath and wheezing.    Cardiovascular: Negative for chest pain and palpitations.        Dull left shoulder pain   Gastrointestinal: Negative for abdominal pain, constipation, diarrhea and vomiting.   Endocrine: Negative for cold intolerance and heat intolerance.   Genitourinary: Negative for dysuria and flank pain.   Musculoskeletal: Positive for arthralgias. Negative for myalgias and neck pain.   Skin: Negative for pallor, rash and wound.  Allergic/Immunologic: Negative for environmental allergies and food allergies.   Neurological: Negative for syncope and headaches.   Hematological: Negative for adenopathy. Does not bruise/bleed easily.   Psychiatric/Behavioral: Positive for dysphoric mood. Negative for suicidal ideas. The patient is not nervous/anxious.    All other systems reviewed and are negative.      Vitals:    11/16/15 1125   BP: 123/62   Pulse: 86   Resp: 18   Temp: 98.4 ??F (36.9 ??C)   SpO2: 97%   Weight: 72.6 kg (160 lb)   Height: 5\' 4"  (1.626 m)            Physical Exam   Constitutional: She is oriented to person, place, and time. She appears well-developed and well-nourished. She appears distressed.   HENT:   Head: Normocephalic and atraumatic.   Mouth/Throat: Oropharynx is clear and moist.   Eyes: Conjunctivae and EOM are normal. Pupils are equal, round, and reactive to light. Right eye exhibits no discharge. Left eye exhibits no discharge. No scleral icterus.    Neck: Normal range of motion. Neck supple. No thyromegaly present.   Cardiovascular: Normal rate, regular rhythm, normal heart sounds and intact distal pulses.  Exam reveals no gallop and no friction rub.    No murmur heard.  Pulmonary/Chest: Effort normal and breath sounds normal. No respiratory distress. She has no wheezes. She has no rales. She exhibits no tenderness.   Abdominal: Soft. Bowel sounds are normal. She exhibits no distension. There is no hepatosplenomegaly. There is no tenderness. There is no rebound and no guarding. No hernia.   Musculoskeletal: Normal range of motion. She exhibits no edema or tenderness.   Neurological: She is alert and oriented to person, place, and time. No cranial nerve deficit. She exhibits normal muscle tone.   Skin: Skin is warm. No rash noted. She is not diaphoretic. No erythema.   Psychiatric: She has a normal mood and affect. Her behavior is normal. Judgment and thought content normal.   Nursing note and vitals reviewed.       MDM  Number of Diagnoses or Management Options  Atypical chest pain: new and requires workup  Chronic thoracic aortic dissection (HCC): established and improving  Malaise and fatigue: new and requires workup  Diagnosis management comments: EKG reviewed  Sinus rhythm, normal intervals, no axis, no ectopy, no acute ischemic changes.    Differential diagnosis  Anemia, electrolyte imbalance, renal failure  Consider cardiac etiology including heart failure, chronic ischemia.  Acute ischemia is less likely given the patient's symptoms today.  Also, consider progression of her aortic dissection    [facility]:  Impression:  1. Normal appearance the ascending aorta. Dissection starting in the mid  descending aorta extending through the abdominal aorta and the left common  iliac artery with the true lumen overall is smaller size than the false  lumen and there are calcifications along the intimal flap suggestive some   component of chronicity. The branch vessels off the aortic arch and the  mesenteric vessels and renal arteries are all patent. No notable  atherosclerotic calcifications.????The maximal size of the the descending  aorta is 3.0 cm.       Amount and/or Complexity of Data Reviewed  Clinical lab tests: ordered and reviewed  Tests in the radiology section of CPT??: ordered and reviewed  Tests in the medicine section of CPT??: ordered and reviewed  Review and summarize past medical records: yes    Risk of Complications, Morbidity,  and/or Mortality  Presenting problems: high  Diagnostic procedures: high  Management options: moderate  General comments: Elements of this note have been dictated via voice recognition software.  Text and phrases may be limited by the accuracy of the software.  The chart has been reviewed, but errors may still be present.      Patient Progress  Patient progress: improved    ED Course       Procedures    4:30 PM  Case reviewed with cardiology with 2 negative troponins and normal EKG and no ongoing pain.  Patient is referred for outpatient workup.    The patient's complaints of malaise, weakness and chest discomfort over the past month or more were reviewed with cardiology.  The symptomatology seems more chronic then what Dr. Mercer Pod was made aware of in his office earlier today.  Given the patient's family history and ongoing medical issues, I think is appropriate for cardiology to evaluate this patient in the outpatient setting to determine what further workup may be indicated.  At this point, I feel the patient is stable for discharge and I have called the cardiology referral line to help expedite evaluation in their office.  I will also given the patient specific instructions for reasons to follow up with [place] should any symptoms change or new symptoms occur.

## 2015-11-16 NOTE — ED Notes (Signed)
I have reviewed discharge instructions with the patient.  The patient verbalized understanding. Pt ambulated to waiting room w/o incident.

## 2015-11-16 NOTE — ED Triage Notes (Signed)
Pt arrives per EMS for complaints of chest pain over 4 weeks. Went to primary MD today and sent here for further evaluation.

## 2015-11-18 ENCOUNTER — Ambulatory Visit
Admit: 2015-11-18 | Discharge: 2015-11-18 | Payer: PRIVATE HEALTH INSURANCE | Attending: Specialist | Primary: Specialist

## 2015-11-18 DIAGNOSIS — I71019 Dissection of thoracic aorta, unspecified: Secondary | ICD-10-CM

## 2015-11-18 NOTE — Progress Notes (Signed)
[facility]  Campbell Riches, M.D.  Sudhirkumar C. Posey Pronto, M.D.  [place], [place]  Ph No:  [phone]  Fax:  [phone]      Chief Complaint   Patient presents with   ??? Follow-up     ER visit on Tuesday       History of Present Illness:  Ms. Calixto is a 65 y.o. female that presents today for follow-up,    Patient still has precordial chest pain nonspecific left arm numbness, patient has an appointment to see Dr. Octavio Manns for further evaluation for ischemic heart disease, multiple risk factors, including strong family history of heart disease, hypertension, hyperlipidemia, history of dissection, CT of the chest unremarkable, no new progression of dissection,    Appears to be anxious, excessive fatigue, patient still on fluoxetine/Xanax,    Documented B12 deficiency, insurance not approving for B12 injections,    Allergies   Allergen Reactions   ??? Aspirin Other (comments)     "Flu-like symptoms"   ??? Cymbalta [Duloxetine] Other (comments)     pain   ??? Elavil Other (comments)     pain   ??? Forteo [Teriparatide] Other (comments)   ??? Lyrica [Pregabalin] Other (comments)     Hallucinations and falling   ??? Prolia [Denosumab] Rash   ??? Topamax [Topiramate] Other (comments)     Slurred speach   ??? Ultram [Tramadol] Drowsiness     Past Medical History:   Diagnosis Date   ??? Aorta disorder (HCC)     TYPE B   ??? Arthritis    ??? Asthma    ??? Chronic pain     Back   ??? Heart disease    ??? Osteoporosis    ??? Rib fractures     2009   ??? Vitamin B 12 deficiency    ??? Vitamin D deficiency      Past Surgical History:   Procedure Laterality Date   ??? HX BUNIONECTOMY     ??? HX TONSIL AND ADENOIDECTOMY       Family History   Problem Relation Age of Onset   ??? Cancer Mother    ??? Cancer Father      Social History     Social History   ??? Marital status: MARRIED     Spouse name: N/A   ??? Number of children: N/A   ??? Years of education: N/A     Occupational History   ??? Not on file.      Social History Main Topics   ??? Smoking status: Never Smoker   ??? Smokeless tobacco: Not on file   ??? Alcohol use No   ??? Drug use: No   ??? Sexual activity: Not on file     Other Topics Concern   ??? Not on file     Social History Narrative     Current Outpatient Prescriptions   Medication Sig Dispense Refill   ??? ALPRAZolam (XANAX) 1 mg tablet Take 1 Tab by mouth three (3) times daily as needed for Anxiety or Sleep. Max Daily Amount: 3 mg. Indications: anxiety 60 Tab 5   ??? atorvastatin (LIPITOR) 10 mg tablet Take 1 Tab by mouth daily. 90 Tab 4   ??? metoprolol tartrate (LOPRESSOR) 25 mg tablet Take 1 Tab by mouth three (3) times daily. Hold if BP < 100 or HR <55 270 Tab 4   ??? FLUoxetine (PROZAC) 40 mg capsule Take 1 Cap by  mouth daily. 90 Cap 3   ??? montelukast (SINGULAIR) 10 mg tablet Take 1 Tab by mouth daily. 90 Tab 3   ??? cyanocobalamin, vitamin B-12, 1,000 mcg/mL kit 1 mL by Injection route every month. 30 mL 1   ??? raNITIdine (ZANTAC) 150 mg tablet Take 150 mg by mouth two (2) times a day.     ??? fluticasone (FLONASE) 50 mcg/actuation nasal spray 2 Sprays by Both Nostrils route daily.     ??? budesonide-formoterol (SYMBICORT) 160-4.5 mcg/actuation HFA inhaler Take 2 Puffs by inhalation two (2) times a day.     ??? albuterol sulfate 90 mcg/actuation aepb Take 2 Puffs by inhalation every six (6) hours as needed (wheezing).           REVIEW OF SYSTEMS:  GENERAL: negative for - chills, fever, , malaise, night sweats,  weight gain or weight loss.    EYES: negative for  - blurred vision, double vision, photophobia, pain, discharge and redness.    ENT AND MOUTH: negative for - epistaxis,   nasal discharge, nasal polyps, oral lesions, sinus pain,  sore throat, tinnitus, vertigo, visual changes or vocal changes.    CARDIOVASCULAR: Positive symptoms as above  RESPIRATORY:  negative for - cough, hemoptysis,, pleuritic pain, shortness of breath, sputum changes, , tachypnea or wheezing.     GASTROINTESTINAL: negative for - abdominal pain, appetite loss, blood in stools, change in bowel habits, , constipation, diarrhea, gas/bloating, heartburn, hematemesis,     GENITOURINARY: No change in urinary stream, dysuria,  genital discharge, genital ulcers, hematuria, incontinence, irregular/heavy menses, nocturia, pelvic pain, , urinary frequency/urgency or vulvar/vaginal symptoms. No flank pain.        PHYSICAL EXAM  General appearance - alert, well appearing, and in no distress    Mental status - alert, oriented to person, place, and time    Ears - bilateral TM's and external ear canals normal    Nose - normal and patent, no erythema, discharge or polyps    Neck - supple, no significant adenopathy, carotids upstroke normal bilaterally, no bruits, thyroid exam: thyroid is normal in size without nodules or tenderness    Throat / Mouth - no erythema, no tonsillar exudate, normal dental hygiene, no ulcers    Chest - clear to auscultation, no wheezes, rales or rhonchi, symmetric air entry, nonspecific left rib tenderness anteriorly,    Heart - normal rate and regular rhythm, S1 and S2 normal, no gallops noted, no murmur    Abdomen - soft, nontender, nondistended, no masses or organomegaly    Neurological - alert, oriented, normal speech, no focal findings or movement disorder noted    Musculoskeletal - no joint tenderness, deformity or swelling    Extremities - peripheral pulses normal, no pedal edema, no clubbing or cyanosis    Skin - normal coloration and turgor, no rashes, no suspicious skin lesions noted      There is no immunization history on file for this patient.       LABS:   Results for orders placed or performed during the hospital encounter of 11/16/15   CBC WITH AUTOMATED DIFF   Result Value Ref Range    WBC 7.4 4.3 - 11.1 K/uL    RBC 4.32 4.05 - 5.25 M/uL    HGB 12.5 11.7 - 15.4 g/dL    HCT 37.7 35.8 - 46.3 %    MCV 87.3 79.6 - 97.8 FL    MCH 28.9 26.1 - 32.9 PG    MCHC 33.2 31.4 -  35.0 g/dL     RDW 13.9 11.9 - 14.6 %    PLATELET 250 150 - 450 K/uL    MPV 10.5 (L) 10.8 - 14.1 FL    DF AUTOMATED      NEUTROPHILS 45 43 - 78 %    LYMPHOCYTES 34 13 - 44 %    MONOCYTES 6 4.0 - 12.0 %    EOSINOPHILS 15 (H) 0.5 - 7.8 %    BASOPHILS 0 0.0 - 2.0 %    IMMATURE GRANULOCYTES 0.5 0.0 - 5.0 %    ABS. NEUTROPHILS 3.3 1.7 - 8.2 K/UL    ABS. LYMPHOCYTES 2.5 0.5 - 4.6 K/UL    ABS. MONOCYTES 0.4 0.1 - 1.3 K/UL    ABS. EOSINOPHILS 1.1 (H) 0.0 - 0.8 K/UL    ABS. BASOPHILS 0.0 0.0 - 0.2 K/UL    ABS. IMM. GRANS. 0.0 0.0 - 0.5 K/UL   METABOLIC PANEL, COMPREHENSIVE   Result Value Ref Range    Sodium 145 136 - 145 mmol/L    Potassium 4.1 3.5 - 5.1 mmol/L    Chloride 109 (H) 98 - 107 mmol/L    CO2 28 21 - 32 mmol/L    Anion gap 8 7 - 16 mmol/L    Glucose 94 65 - 100 mg/dL    BUN 18 8 - 23 MG/DL    Creatinine 0.67 0.6 - 1.0 MG/DL    GFR est AA >60 >60 ml/min/1.62m    GFR est non-AA >60 >60 ml/min/1.723m   Calcium 9.3 8.3 - 10.4 MG/DL    Bilirubin, total 0.3 0.2 - 1.1 MG/DL    ALT (SGPT) 39 12 - 65 U/L    AST (SGOT) 28 15 - 37 U/L    Alk. phosphatase 94 50 - 136 U/L    Protein, total 7.0 6.3 - 8.2 g/dL    Albumin 3.6 3.2 - 4.6 g/dL    Globulin 3.4 2.3 - 3.5 g/dL    A-G Ratio 1.1 (L) 1.2 - 3.5     BNP   Result Value Ref Range    BNP 69 pg/mL   MAGNESIUM   Result Value Ref Range    Magnesium 2.2 1.8 - 2.4 mg/dL   PHOSPHORUS   Result Value Ref Range    Phosphorus 3.1 2.3 - 3.7 MG/DL   POC TROPONIN-I   Result Value Ref Range    Troponin-I (POC) 0.01 0.0 - 0.08 ng/ml   POC TROPONIN-I   Result Value Ref Range    Troponin-I (POC) 0 0.0 - 0.08 ng/ml   EKG, 12 LEAD, INITIAL   Result Value Ref Range    Ventricular Rate 61 BPM    Atrial Rate 61 BPM    P-R Interval 180 ms    QRS Duration 90 ms    Q-T Interval 432 ms    QTC Calculation (Bezet) 434 ms    Calculated P Axis 50 degrees    Calculated R Axis 35 degrees    Calculated T Axis 40 degrees    Diagnosis       !! AGE AND GENDER SPECIFIC ECG ANALYSIS !!  Normal sinus rhythm  Normal ECG   No previous ECGs available  Confirmed by CEBE  MD ([facility]), JOBalinda [phone] 11/16/2015 1:13:53 PM           IMPRESSION    ICD-10-CM ICD-9-CM    1. 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 BMI 27.0-27.9,adult Z68.27 V85.23  3. Benign essential HTN I10 401.1    4. Precordial pain R07.2 786.51    5. Vitamin B 12 deficiency E53.8 266.2 VITAMIN B12         PLAN  ?? Check B12 level, follow-up cardiology for further evaluation for ischemic heart disease, heart catheterization,  ??   ?? Stable hypertension, reviewed the labs/records from the ER visit,  ??   ?? Continue fluoxetine/Xanax as directed.      BWLSLHTD Theodoro Kos, MD

## 2015-11-19 LAB — VITAMIN B12: Vitamin B12: 335 pg/mL (ref 211–946)

## 2015-11-23 ENCOUNTER — Ambulatory Visit
Admit: 2015-11-23 | Discharge: 2015-11-23 | Payer: PRIVATE HEALTH INSURANCE | Attending: Cardiovascular Disease | Primary: Specialist

## 2015-11-23 DIAGNOSIS — I71019 Dissection of thoracic aorta, unspecified: Secondary | ICD-10-CM

## 2015-11-23 NOTE — Progress Notes (Signed)
Suzanne Pham, [address]  [place] [address]  PHONE: [phone]  Suzanne Pham  09-Dec-1950    SUBJECTIVE:   Suzanne Pham is a 65 y.o. female seen for a consultation visit regarding the following:     Chief Complaint   Patient presents with   ??? Chest Pain     Dr. Ok Anis pain/hx of aorta dissection   ??? Referral / Consult            HPI:  Consultation is requested by Suzanne Brunswick, MD for evaluation of Chest Pain (Dr. Ok Anis pain/hx of aorta dissection) and Referral / Consult   .    HPI 65 year old female seen for chest pain.  She has almost a constant discomfort in her chest and upper back.  She is also has a soreness along the left costal margin.  She broke some ribs said residual tenderness there.  About 7 months ago she had severe chest and back pain and diaphoresis.  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  She is been evaluated at [facility] including a follow-up CT a couple of months ago.  She has a stable dissection below the left subclavian down to the left common iliac.  There is a double lumen present.  The overall diameter appears somewhat apparently is stable.  The there is no significant dilatation of the aortic arch.  There is no mention of coronary calcification.  Apparently the proximal portion of the coronary arteries were visualized and felt to be normal.  She complains of being extremely fatigued.  Recent echo showed normal LV function no significant valve abnormalities.  She has had problems with depression.  She does have a family history of numerous members with premature coronary disease.  There is no history of any aortic aneurysms or Marfan syndrome.  There are no significant lipid abnormalities or diabetes.  She is a non-smoker.        Past Medical History, Past Surgical History, Family history, Social History, and Medications were all reviewed with the patient today and updated as necessary.     Prior to Admission medications     Medication Sig Start Date End Date Taking? Authorizing Provider   fluticasone-vilanterol (BREO ELLIPTA) 200-25 mcg/dose inhaler Take 1 Puff by inhalation daily.   Yes Historical Provider   IBUPROFEN (MOTRIN PO) Take 600 mg by mouth daily.   Yes Historical Provider   ALPRAZolam Duanne Moron) 1 mg tablet Take 1 Tab by mouth three (3) times daily as needed for Anxiety or Sleep. Max Daily Amount: 3 mg. Indications: anxiety 11/16/15  Yes Siddesha Theodoro Kos, MD   atorvastatin (LIPITOR) 10 mg tablet Take 1 Tab by mouth daily. 11/16/15  Yes [id number] Theodoro Kos, MD   metoprolol tartrate (LOPRESSOR) 25 mg tablet Take 1 Tab by mouth three (3) times daily. Hold if BP < 100 or HR <55  Patient taking differently: Take 25 mg by mouth two (2) times a day. Hold if BP < 100 or HR <55. Three times a day if needed 11/16/15  Yes Siddesha Theodoro Kos, MD   FLUoxetine (PROZAC) 40 mg capsule Take 1 Cap by mouth daily. 07/15/15  Yes Siddesha Theodoro Kos, MD   montelukast (SINGULAIR) 10 mg tablet Take 1 Tab by mouth daily. 07/15/15  Yes Siddesha Theodoro Kos, MD   cyanocobalamin, vitamin B-12, 1,000 mcg/mL kit 1 mL by Injection route every month. 07/15/15  Yes Siddesha Theodoro Kos, MD   raNITIdine (ZANTAC) 150 mg tablet Take 150 mg by mouth  two (2) times a day.   Yes Historical Provider   fluticasone (FLONASE) 50 mcg/actuation nasal spray 2 Sprays by Both Nostrils route daily. 05/08/15  Yes Historical Provider   albuterol sulfate 90 mcg/actuation aepb Take 2 Puffs by inhalation every six (6) hours as needed (wheezing). 05/08/15  Yes Historical Provider     Allergies   Allergen Reactions   ??? Aspirin Other (comments)     "Flu-like symptoms"   ??? Cymbalta [Duloxetine] Other (comments)     pain   ??? Elavil Other (comments)     pain   ??? Forteo [Teriparatide] Other (comments)   ??? Lyrica [Pregabalin] Other (comments)     Hallucinations and falling   ??? Prolia [Denosumab] Rash   ??? Topamax [Topiramate] Other (comments)     Slurred speach    ??? Ultram [Tramadol] Drowsiness     Past Medical History:   Diagnosis Date   ??? Aorta disorder (HCC)     TYPE B   ??? Arthritis    ??? Asthma    ??? Chronic pain     Back   ??? Heart disease    ??? Osteoporosis    ??? Rib fractures     2009   ??? Vitamin B 12 deficiency    ??? Vitamin D deficiency      Past Surgical History:   Procedure Laterality Date   ??? HX BUNIONECTOMY     ??? HX TONSIL AND ADENOIDECTOMY       Family History   Problem Relation Age of Onset   ??? Cancer Mother    ??? Cancer Father      Social History   Substance Use Topics   ??? Smoking status: Never Smoker   ??? Smokeless tobacco: Not on file   ??? Alcohol use No       ROS:    Review of Systems   Constitution: Positive for malaise/fatigue. Negative for decreased appetite, fever, weakness, weight gain and weight loss.   HENT: Negative for hearing loss and nosebleeds.    Eyes: Negative for visual disturbance.   Cardiovascular: Positive for chest pain. Negative for claudication, dyspnea on exertion, irregular heartbeat, leg swelling, near-syncope, orthopnea, palpitations, paroxysmal nocturnal dyspnea and syncope.   Respiratory: Negative for cough, shortness of breath, snoring, sputum production and wheezing.    Endocrine: Negative for cold intolerance, heat intolerance, polydipsia, polyphagia and polyuria.   Hematologic/Lymphatic: Negative for adenopathy and bleeding problem. Does not bruise/bleed easily.   Skin: Negative for color change and rash.   Musculoskeletal: Positive for back pain. Negative for arthritis, joint pain, muscle weakness, myalgias and stiffness.   Gastrointestinal: Negative for abdominal pain, anorexia, change in bowel habit, heartburn, hematemesis, hematochezia, melena, nausea and vomiting.   Genitourinary: Negative for bladder incontinence, dysuria and nocturia.   Neurological: Negative for disturbances in coordination, focal weakness, loss of balance, numbness and seizures.   Psychiatric/Behavioral: Positive for depression. Negative for memory loss.  The patient does not have insomnia and is not nervous/anxious.    Allergic/Immunologic: Negative for environmental allergies and hives.          PHYSICAL EXAM:     Visit Vitals   ??? BP 108/60   ??? Pulse 64   ??? Ht 5' 4"  (1.626 m)   ??? Wt 165 lb (74.8 kg)   ??? BMI 28.32 kg/m2        Physical Exam   Constitutional: She is oriented to person, place, and time. She appears well-developed and well-nourished.  HENT:   Head: Normocephalic.   Nose: Nose normal.   Mouth/Throat: Oropharynx is clear and moist.   Eyes: EOM are normal. Pupils are equal, round, and reactive to light.   Neck: Normal range of motion. Neck supple. No JVD present. No thyromegaly present.   Cardiovascular: Normal rate, regular rhythm, normal heart sounds and intact distal pulses.  Exam reveals no gallop.    No murmur heard.  BP left arm 130/76  Right 134/76   Pulmonary/Chest: Breath sounds normal.   Abdominal: Soft. She exhibits no mass. There is no tenderness.   Musculoskeletal: Normal range of motion. She exhibits no edema or tenderness.   Neurological: She is alert and oriented to person, place, and time.   Skin: Skin is warm and dry.   Psychiatric:   Flat affect       Medical problems and test results were reviewed with the patient today.           Lab Results   Component Value Date/Time    BUN 18 11/16/2015 11:28 AM     Lab Results   Component Value Date/Time    Creatinine 0.67 11/16/2015 11:28 AM     Lab Results   Component Value Date/Time    Potassium 4.1 11/16/2015 11:28 AM       Lab Results   Component Value Date/Time    Cholesterol, total 172 07/15/2015 10:27 AM    HDL Cholesterol 32 07/15/2015 10:27 AM    LDL, calculated 107 07/15/2015 10:27 AM    VLDL, calculated 33 07/15/2015 10:27 AM    Triglyceride 166 07/15/2015 10:27 AM       ASSESSMENT and PLAN    Suzanne Pham was seen today for chest pain and referral / consult.    Diagnoses and all orders for this visit:    Aortic dissection distal to left subclavian [facility])  -     Stress Test, Nuclear; Future   -     Cardiac Stress Test, Complete; Future    Chest pain, unspecified  -     Stress Test, Nuclear; Future  -     Cardiac Stress Test, Complete; Future    Essential hypertension, benign          IMPRESSION:      a lengthy discussion about her problems.  She is tender along the left costal margin.  I think some of the chest pain is related to the previous rib fractures.  There may be some residual discomfort from her aortic dissection.  She does have some coronary risk factors.  EKGs did not show any ischemic changes.  We will get a nuclear stress test with Lexiscan.  she is to continue her beta-blockers prior to the test.  I am reluctant  to hold her beta-blockers and do an exercise stress test.  Cardiac catheterization probably  canbe done from the arm.   There maybe  some additional risk of going from the groin with her dissection.  The profound fatigue may be related to her depression.  She has preserved LV function.      Follow-up Disposition:  Return in about 4 weeks (around 12/21/2015).          Thank you for allowing me to participate in this patient's care.  Please call or contact me if there are any questions or concerns regarding the above.      Nechama Guard, MD  11/23/15  1:16 PM

## 2015-12-10 ENCOUNTER — Encounter

## 2015-12-15 ENCOUNTER — Institutional Professional Consult (permissible substitution): Admit: 2015-12-15 | Discharge: 2015-12-15 | Payer: PRIVATE HEALTH INSURANCE | Primary: Specialist

## 2015-12-15 ENCOUNTER — Institutional Professional Consult (permissible substitution): Primary: Specialist

## 2015-12-15 DIAGNOSIS — R079 Chest pain, unspecified: Secondary | ICD-10-CM

## 2015-12-15 MED ORDER — REGADENOSON 0.4 MG/5 ML IV SYRINGE
0.4 mg/5 mL | Freq: Once | INTRAVENOUS | Status: AC
Start: 2015-12-15 — End: 2015-12-15
  Administered 2015-12-15: 13:00:00 via INTRAVENOUS

## 2015-12-15 NOTE — Progress Notes (Signed)
[facility]  [address]  [address]  [place] [id number]  [phone]       NUCLEAR PERFUSION STUDY        Date: 12/15/2015      Name: Suzanne Pham  DOB: 04/06/1951  MRN: [id number]  Age: 65 y.o.  Sex: female    Ht Readings from Last 1 Encounters:   11/23/15 5\' 4"  (1.626 m)     Wt Readings from Last 1 Encounters:   11/23/15 165 lb (74.8 kg)       Referring Physician: Georganna SkeansLawrence W Freeman     Family Physician: Uvaldo BristleSiddesha M Arashinagundi, MD    Ordering Physician:  Georganna SkeansLawrence W. Freeman, M.D.    Test Proctor:  Sharlyn BolognaKarol Stahl, RN                 TEST INDICATIONS: The patient is here for evaluation of possible coronary artery disease.     TEST INDICATIONS: chest pain and fatigue.    CAD RISK FACTORS: hypertension, hypercholesterolemia/hyperlipidemia, asthma, positive family history and Age: Woman > 55, arthritis, Hx: left rib fractures    OTHER CARDIAC HISTORY:  Stable Type B aortic dissection below left subclavian to left common iliac    INFORMED CONSENT:  The nature of the procedure and its benefits were discussed with the patient.  Risks include, but are not limited to:  Reaction to radiopharmaceutical agent, reaction to the chemical stress agent (if used),  inability to complete the study, arrhythmias, MI and death.  The patient had no further questions and agreed to proceed.     Stress test performed is a one day study with stress and rest performed on the same day.    SPECT IMAGING DOSAGE:  The patient was injected with 10.9 mCi of Cardiolite [id number] intravenously at rest with delayed imaging, then the patient received a bolus of  0.4mg  of Lexiscan (regadenoson) followed within 15 seconds by  30.2 mCi of Cardiolite [id number] intraveneously with delayed imaging.    INJECTION SITE: Right hand .    BETA BLOCKER ASSESSMENT:   Patient's last dose of a beta blocker was last night.    INDICATIONS FOR PHARMACOLOGICAL TEST IF NEEDED: inability to exercise adequately due to conditioning/medical problems.     TEST PROTOCOL: Lexiscan.    TEST TOLERANCE:The patient received a bolus of Lexiscan 0.4mg  IV.    SYMPTOMS: The patient experienced mild dyspnea, fatigued feeling and abdominal discomfort after Lexiscan.  Aminophylline 50 mg IV given with relief stated.    TEST TERMINATION: The test was stopped due to projected end-point reached.     STRESS TEST VITALS:     Resting HR =  53 / minute   Resting B/P =  106/60    Peak HR  =  83 / minute   Peak B/P  =  94/50    BASELINE EKG: nonspecific ST and T waves changes and sinus bradycardia    STRESS EKG: normal EKG, normal sinus rhythm      SPECT IMAGE INTERPRETATION    Image Quality: excellent.    Tomographic views in multiple axis show normal perfusion in all wall segments.    Artifact Suspected: none.    GATED SPECT IMAGE INTERPRETATION:     Left Ventricular Size: normal.  Transient Ischemic Dilatation: not present.  Gated cine images demonstrates reveals normal myocardial thickening and wall motion.    Ejection Fraction: 71%    CONCLUSION:   1. Stress EKG: Normal.   2. SPECT Perfusion Imaging: Normal  Perfusion.  3. LV Systolic Function is  normal.   4. Risk Assessment:  Low Risk Scan.    Comments: Routine follow up recommended.     Seabron Spates, MD  12/16/2015  8:34 AM

## 2015-12-22 MED ORDER — FLUTICASONE 200 MCG-VILANTEROL 25 MCG/DOSE BREATH ACTIVATED INHALER
200-25 mcg/dose | Freq: Every day | RESPIRATORY_TRACT | 2 refills | Status: DC
Start: 2015-12-22 — End: 2016-01-04

## 2015-12-27 ENCOUNTER — Encounter: Attending: Cardiovascular Disease | Primary: Specialist

## 2016-01-04 ENCOUNTER — Ambulatory Visit
Admit: 2016-01-04 | Discharge: 2016-01-04 | Payer: PRIVATE HEALTH INSURANCE | Attending: Cardiovascular Disease | Primary: Specialist

## 2016-01-04 DIAGNOSIS — R079 Chest pain, unspecified: Secondary | ICD-10-CM

## 2016-01-04 NOTE — Progress Notes (Signed)
[address], [address]  [place] [address]  PHONE: [phone]  Suzanne Pham  January 15, 1951    SUBJECTIVE:   Suzanne Pham is a 65 y.o. female seen for a follow up visit regarding the following:     Chief Complaint   Patient presents with   ??? Hypertension     follow up       HPI:    HPI patient seen back for follow-up visit.  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  She is followed by the vascular surgeons at [facility].  Been on antihypertensives.  She does not have any known coronary disease.  There is nothing visible on her CTs.  She comes back following nuclear stress test.  It was done with Lexiscan.  It was negative for ischemia.  She still is profoundly fatigued.  She was also placed on Lipitor a few months ago when she was up at [facility].  Noticed progressive generalized myalgias and achiness     Past Medical History, Past Surgical History, Family history, Social History, and Medications were all reviewed with the patient today and updated as necessary.     Prior to Admission medications    Medication Sig Start Date End Date Taking? Authorizing Provider   fluticasone-vilanterol (BREO ELLIPTA) 200-25 mcg/dose inhaler Take 1 Puff by inhalation daily.   Yes Historical Provider   IBUPROFEN (MOTRIN PO) Take 600 mg by mouth daily.   Yes Historical Provider   ALPRAZolam Duanne Moron) 1 mg tablet Take 1 Tab by mouth three (3) times daily as needed for Anxiety or Sleep. Max Daily Amount: 3 mg. Indications: anxiety 11/16/15  Yes Siddesha Theodoro Kos, MD   atorvastatin (LIPITOR) 10 mg tablet Take 1 Tab by mouth daily. 11/16/15  Yes LOVFIEPP Theodoro Kos, MD   metoprolol tartrate (LOPRESSOR) 25 mg tablet Take 1 Tab by mouth three (3) times daily. Hold if BP < 100 or HR <55  Patient taking differently: Take 25 mg by mouth two (2) times a day. Hold if BP < 100 or HR <55. Three times a day if needed 11/16/15  Yes Siddesha Theodoro Kos, MD   FLUoxetine (PROZAC) 40 mg capsule Take 1 Cap by mouth daily. 07/15/15  Yes  Siddesha Theodoro Kos, MD   montelukast (SINGULAIR) 10 mg tablet Take 1 Tab by mouth daily. 07/15/15  Yes Siddesha Theodoro Kos, MD   fluticasone (FLONASE) 50 mcg/actuation nasal spray 2 Sprays by Both Nostrils route daily. 05/08/15  Yes Historical Provider   albuterol sulfate 90 mcg/actuation aepb Take 2 Puffs by inhalation every six (6) hours as needed (wheezing). 05/08/15  Yes Historical Provider   cyanocobalamin, vitamin B-12, 1,000 mcg/mL kit 1 mL by Injection route every month. 07/15/15   IRJJOACZ Theodoro Kos, MD   raNITIdine (ZANTAC) 150 mg tablet Take 150 mg by mouth two (2) times a day.    Historical Provider     Allergies   Allergen Reactions   ??? Aspirin Other (comments)     "Flu-like symptoms"   ??? Cymbalta [Duloxetine] Other (comments)     pain   ??? Elavil Other (comments)     pain   ??? Forteo [Teriparatide] Other (comments)   ??? Lyrica [Pregabalin] Other (comments)     Hallucinations and falling   ??? Prolia [Denosumab] Rash   ??? Topamax [Topiramate] Other (comments)     Slurred speach   ??? Ultram [Tramadol] Drowsiness     Past Medical History:   Diagnosis Date   ??? 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 Arthritis    ??? Asthma    ??? Chronic pain     Back   ??? Heart disease    ??? Osteoporosis    ??? Rib fractures     2009   ??? Vitamin B 12 deficiency    ??? Vitamin D deficiency      Past Surgical History:   Procedure Laterality Date   ??? HX BUNIONECTOMY     ??? HX TONSIL AND ADENOIDECTOMY       Family History   Problem Relation Age of Onset   ??? Cancer Mother    ??? Cancer Father       Social History   Substance Use Topics   ??? Smoking status: Never Smoker   ??? Smokeless tobacco: Never Used   ??? Alcohol use No       ROS:    Review of Systems   Constitution: Positive for weakness and malaise/fatigue.   Cardiovascular: Positive for chest pain.   Respiratory: Negative.    Musculoskeletal: Positive for myalgias.   Gastrointestinal: Negative.    Neurological: Negative for focal weakness.    Psychiatric/Behavioral: Positive for depression.           PHYSICAL EXAM:    Visit Vitals   ??? BP 128/62   ??? Ht 5' 4"  (1.626 m)   ??? Wt 159 lb (72.1 kg)   ??? BMI 27.29 kg/m2          Wt Readings from Last 3 Encounters:   01/04/16 159 lb (72.1 kg)   11/23/15 165 lb (74.8 kg)   11/18/15 160 lb (72.6 kg)     BP Readings from Last 3 Encounters:   01/04/16 128/62   11/23/15 108/60   11/18/15 106/72         Physical Exam   Constitutional: She is oriented to person, place, and time. She appears well-developed and well-nourished.   Neck: No JVD present.   Cardiovascular: Normal rate, regular rhythm and normal heart sounds.    2+ radial pulses bilat.   Pulmonary/Chest: Breath sounds normal.   Abdominal: Soft. There is no tenderness.   Musculoskeletal: She exhibits no edema.   Neurological: She is alert and oriented to person, place, and time.   Skin: Skin is warm and dry.   Psychiatric:   Flat affect       Medical problems and test results were reviewed with the patient today.       Lab Results   Component Value Date/Time    BUN 18 11/16/2015 11:28 AM     Lab Results   Component Value Date/Time    Creatinine 0.67 11/16/2015 11:28 AM     Lab Results   Component Value Date/Time    Potassium 4.1 11/16/2015 11:28 AM         Lab Results   Component Value Date/Time    Cholesterol, total 172 07/15/2015 10:27 AM    HDL Cholesterol 32 07/15/2015 10:27 AM    LDL, calculated 107 07/15/2015 10:27 AM    VLDL, calculated 33 07/15/2015 10:27 AM    Triglyceride 166 07/15/2015 10:27 AM         ASSESSMENT and PLAN    Suzanne Pham was seen today for hypertension.    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  I do not think there is any indication for cardiac catheterization at this point.  Generalized myalgias may be related to the Lipitor.  I explained the rationale for being on it.   Lipids were not really that elevated.  I suggest she just leave it off for a month and see if the symptoms resolve.  I will see her back in 6 months.  She has scheduled follow-up up at [facility] for aorta.        Follow-up Disposition:  Return in about 6 months (around 07/05/2016).          Nechama Guard, MD  01/04/2016  9:19 AM

## 2016-01-05 ENCOUNTER — Emergency Department: Admit: 2016-01-05 | Payer: MEDICARE | Primary: Specialist

## 2016-01-05 ENCOUNTER — Inpatient Hospital Stay
Admit: 2016-01-05 | Discharge: 2016-01-05 | Disposition: A | Discharge status: Home / Self Care | Payer: MEDICARE | Attending: Emergency Medicine

## 2016-01-05 DIAGNOSIS — J189 Pneumonia, unspecified organism: Secondary | ICD-10-CM

## 2016-01-05 LAB — EKG, 12 LEAD, INITIAL
Atrial Rate: 120 {beats}/min
Calculated R Axis: 46 degrees
Calculated T Axis: 87 degrees
Q-T Interval: 374 ms
QRS Duration: 86 ms
QTC Calculation (Bezet): 409 ms
Ventricular Rate: 72 {beats}/min

## 2016-01-05 LAB — CBC WITH AUTOMATED DIFF
ABS. BASOPHILS: 0 10*3/uL (ref 0.0–0.2)
ABS. EOSINOPHILS: 0.9 10*3/uL — ABNORMAL HIGH (ref 0.0–0.8)
ABS. IMM. GRANS.: 0 10*3/uL (ref 0.0–0.5)
ABS. LYMPHOCYTES: 2.1 10*3/uL (ref 0.5–4.6)
ABS. MONOCYTES: 0.4 10*3/uL (ref 0.1–1.3)
ABS. NEUTROPHILS: 4.1 10*3/uL (ref 1.7–8.2)
BASOPHILS: 0 % (ref 0.0–2.0)
EOSINOPHILS: 12 % — ABNORMAL HIGH (ref 0.5–7.8)
HCT: 38.4 % (ref 35.8–46.3)
HGB: 12.9 g/dL (ref 11.7–15.4)
IMMATURE GRANULOCYTES: 0.4 % (ref 0.0–5.0)
LYMPHOCYTES: 28 % (ref 13–44)
MCH: 28.7 PG (ref 26.1–32.9)
MCHC: 33.6 g/dL (ref 31.4–35.0)
MCV: 85.5 FL (ref 79.6–97.8)
MONOCYTES: 6 % (ref 4.0–12.0)
MPV: 10.1 FL — ABNORMAL LOW (ref 10.8–14.1)
NEUTROPHILS: 54 % (ref 43–78)
PLATELET: 308 10*3/uL (ref 150–450)
RBC: 4.49 M/uL (ref 4.05–5.25)
RDW: 12.9 % (ref 11.9–14.6)
WBC: 7.6 10*3/uL (ref 4.3–11.1)

## 2016-01-05 LAB — METABOLIC PANEL, COMPREHENSIVE
A-G Ratio: 0.9 — ABNORMAL LOW (ref 1.2–3.5)
ALT (SGPT): 19 U/L (ref 12–65)
AST (SGOT): 12 U/L — ABNORMAL LOW (ref 15–37)
Albumin: 3.7 g/dL (ref 3.2–4.6)
Alk. phosphatase: 112 U/L (ref 50–136)
Anion gap: 11 mmol/L (ref 7–16)
BUN: 13 MG/DL (ref 8–23)
Bilirubin, total: 0.3 MG/DL (ref 0.2–1.1)
CO2: 26 mmol/L (ref 21–32)
Calcium: 9.4 MG/DL (ref 8.3–10.4)
Chloride: 106 mmol/L (ref 98–107)
Creatinine: 0.73 MG/DL (ref 0.6–1.0)
GFR est AA: >60 mL/min/{1.73_m2} (ref >60)
GFR est non-AA: >60 mL/min/{1.73_m2} (ref >60)
Globulin: 4.3 g/dL — ABNORMAL HIGH (ref 2.3–3.5)
Glucose: 129 mg/dL — ABNORMAL HIGH (ref 65–100)
Potassium: 3.9 mmol/L (ref 3.5–5.1)
Protein, total: 8 g/dL (ref 6.3–8.2)
Sodium: 143 mmol/L (ref 136–145)

## 2016-01-05 LAB — POC TROPONIN
Troponin-I (POC): 0.01 ng/ml (ref 0.0–0.08)
Troponin-I (POC): 0.01 ng/ml (ref 0.0–0.08)

## 2016-01-05 LAB — POC LACTIC ACID: Lactic Acid (POC): 0.6 mmol/L (ref 0.5–1.9)

## 2016-01-05 LAB — EKG 12-LEAD
Atrial Rate: 120 {beats}/min
Q-T Interval: 374 ms
QRS Duration: 86 ms
QTc Calculation (Bazett): 409 ms
R Axis: 46 degrees
T Axis: 87 degrees
Ventricular Rate: 72 {beats}/min

## 2016-01-05 MED ORDER — ONDANSETRON 8 MG TAB, RAPID DISSOLVE
8 mg | ORAL_TABLET | Freq: Three times a day (TID) | ORAL | 0 refills | Status: DC | PRN
Start: 2016-01-05 — End: 2016-01-18

## 2016-01-05 MED ORDER — SALINE PERIPHERAL FLUSH PRN
Freq: Once | INTRAMUSCULAR | Status: AC
Start: 2016-01-05 — End: 2016-01-05
  Administered 2016-01-05: 16:00:00

## 2016-01-05 MED ORDER — SODIUM CHLORIDE 0.9 % IV PIGGY BACK
500 mg | INTRAVENOUS | Status: DC
Start: 2016-01-05 — End: 2016-01-05
  Administered 2016-01-05: 16:00:00 via INTRAVENOUS

## 2016-01-05 MED ORDER — LEVOFLOXACIN 750 MG TAB
750 mg | ORAL_TABLET | Freq: Every day | ORAL | 0 refills | Status: DC
Start: 2016-01-05 — End: 2016-01-18

## 2016-01-05 MED ORDER — HYDROCODONE-ACETAMINOPHEN 7.5 MG-325 MG TAB
ORAL_TABLET | Freq: Four times a day (QID) | ORAL | 0 refills | Status: DC | PRN
Start: 2016-01-05 — End: 2016-01-18

## 2016-01-05 MED ORDER — LABETALOL 5 MG/ML IV SOLN
5 mg/mL | INTRAVENOUS | Status: AC
Start: 2016-01-05 — End: 2016-01-05
  Administered 2016-01-05: 14:00:00 via INTRAVENOUS

## 2016-01-05 MED ORDER — ONDANSETRON (PF) 4 MG/2 ML INJECTION
4 mg/2 mL | INTRAMUSCULAR | Status: AC
Start: 2016-01-05 — End: 2016-01-05
  Administered 2016-01-05: 14:00:00 via INTRAVENOUS

## 2016-01-05 MED ORDER — IOPAMIDOL 76 % IV SOLN
370 mg iodine /mL (76 %) | Freq: Once | INTRAVENOUS | Status: AC
Start: 2016-01-05 — End: 2016-01-05
  Administered 2016-01-05: 16:00:00 via INTRAVENOUS

## 2016-01-05 MED ORDER — ADV ADDAPTOR
2 gram | Status: DC
Start: 2016-01-05 — End: 2016-01-05
  Administered 2016-01-05: 15:00:00 via INTRAVENOUS

## 2016-01-05 MED ORDER — HYDROMORPHONE (PF) 1 MG/ML IJ SOLN
1 mg/mL | INTRAMUSCULAR | Status: AC
Start: 2016-01-05 — End: 2016-01-05
  Administered 2016-01-05: 14:00:00 via INTRAVENOUS

## 2016-01-05 MED ORDER — SODIUM CHLORIDE 0.9% BOLUS IV
0.9 % | Freq: Once | INTRAVENOUS | Status: AC
Start: 2016-01-05 — End: 2016-01-05
  Administered 2016-01-05: 16:00:00 via INTRAVENOUS

## 2016-01-05 MED FILL — ONDANSETRON (PF) 4 MG/2 ML INJECTION: 4 mg/2 mL | INTRAMUSCULAR | Qty: 2

## 2016-01-05 MED FILL — HYDROMORPHONE (PF) 1 MG/ML IJ SOLN: 1 mg/mL | INTRAMUSCULAR | Qty: 1

## 2016-01-05 MED FILL — LABETALOL 5 MG/ML IV SOLN: 5 mg/mL | INTRAVENOUS | Qty: 20

## 2016-01-05 MED FILL — AZITHROMYCIN 500 MG IV SOLUTION: 500 mg | INTRAVENOUS | Qty: 5

## 2016-01-05 MED FILL — CEFTRIAXONE 2 GRAM SOLUTION FOR INJECTION: 2 gram | INTRAMUSCULAR | Qty: 2

## 2016-01-05 NOTE — ED Notes (Signed)
I have reviewed discharge instructions with the patient.  The patient verbalized understanding. Pt given 3 prescriptions. No further questions

## 2016-01-05 NOTE — ED Triage Notes (Signed)
Patient complains of left chest pressure since last night. States it feels like her chest is going to explode. States she has hx of descending aortic dissection last October. Saw Dr. Neale BurlyFreeman yesterday for follow up on nuclear stress test that was done a couple weeks ago.

## 2016-01-05 NOTE — ED Notes (Signed)
Patient reports chest pain and rib pain, reports history of injury to her ribs with intermittent pain and pressure at location of injury.

## 2016-01-05 NOTE — ED Provider Notes (Addendum)
HPI Comments: Patient had a normal stress test in the last couple of weeks.  Has a history of descending aortic dissection.  Presents with acute on chronic left-sided chest pain that is worse than usual since sometime last night.  She complains of some back pain and a feeling as if a warm fluid is washing down her spine.  She denies any ripping or tearing pain but did not have this with her previous dissection.    Patient is a 65 y.o. female presenting with chest pain. The history is provided by the patient.   Chest Pain (Angina)    This is a new (acute on chronic) problem. Episode onset: sometime this morning after midnight. The problem has been gradually worsening. The problem occurs constantly. Associated with: at rest and nothing. The pain is present in the left side. The pain is severe. The quality of the pain is described as sharp ("like my chest is going to explode"). Radiates to: no definite radiation but patient does have pain in her mid and lower back and feels as if a  warm flow of water is pouring down her spine. Exacerbated by: denies aggravating or alleviating factors. Associated symptoms include back pain. Pertinent negatives include no abdominal pain, no cough, no diaphoresis, no fever, no headaches, no nausea, no numbness, no shortness of breath, no vomiting and no weakness. She has tried nothing for the symptoms. Risk factors include hypertension (descending thoracic aortic dissection).        Past Medical History:   Diagnosis Date   ??? Aorta disorder ([facility])     TYPE B   ??? Arthritis    ??? Asthma    ??? Chronic pain     Back   ??? Heart disease    ??? Osteoporosis    ??? Rib fractures     2009   ??? Vitamin B 12 deficiency    ??? Vitamin D deficiency        Past Surgical History:   Procedure Laterality Date   ??? HX BUNIONECTOMY     ??? HX TONSIL AND ADENOIDECTOMY           Family History:   Problem Relation Age of Onset   ??? Cancer Mother    ??? Cancer Father        Social History     Social History    ??? Marital status: MARRIED     Spouse name: N/A   ??? Number of children: N/A   ??? Years of education: N/A     Occupational History   ??? Not on file.     Social History Main Topics   ??? Smoking status: Never Smoker   ??? Smokeless tobacco: Never Used   ??? Alcohol use No   ??? Drug use: No   ??? Sexual activity: Not on file     Other Topics Concern   ??? Not on file     Social History Narrative         ALLERGIES: Aspirin; Cymbalta [duloxetine]; Elavil; Forteo [teriparatide]; Lyrica [pregabalin]; Prolia [denosumab]; Topamax [topiramate]; and Ultram [tramadol]    Review of Systems   Constitutional: Negative for chills, diaphoresis and fever.   HENT: Negative for congestion, rhinorrhea and sore throat.    Eyes: Negative for photophobia and redness.   Respiratory: Negative for cough and shortness of breath.    Cardiovascular: Positive for chest pain. Negative for leg swelling.   Gastrointestinal: Negative for abdominal pain, diarrhea, nausea and vomiting.   Endocrine:  Negative for polydipsia and polyuria.   Genitourinary: Negative for dysuria and frequency.   Musculoskeletal: Positive for back pain. Negative for myalgias.   Neurological: Negative for weakness, numbness and headaches.       Vitals:    01/05/16 0913   BP: 154/71   Pulse: 74   Resp: 16   Temp: 97.6 ??F (36.4 ??C)   SpO2: 99%   Weight: 71.2 kg (157 lb)   Height: 5' 4"  (1.626 m)            Physical Exam   Constitutional: She is oriented to person, place, and time. She appears well-developed and well-nourished.   Eyes: Conjunctivae are normal. Pupils are equal, round, and reactive to light.   Neck: Normal range of motion. Neck supple.   Cardiovascular: Normal rate, regular rhythm, normal heart sounds and intact distal pulses.    No murmur heard.  Pulses:       Carotid pulses are 2+ on the right side, and 2+ on the left side.       Radial pulses are 2+ on the right side, and 2+ on the left side.        Femoral pulses are 2+ on the right side, and 2+ on the left side.        Dorsalis pedis pulses are 2+ on the right side, and 2+ on the left side.        Posterior tibial pulses are 2+ on the right side, and 2+ on the left side.   C medical decision-making notes for blood pressure and left arm and right arm comparison of systolic pressure.   Pulmonary/Chest: Breath sounds normal. No respiratory distress.   Abdominal: Soft. She exhibits no distension. There is no tenderness. There is no rebound and no guarding.   Musculoskeletal: Normal range of motion. She exhibits no edema or tenderness.   Neurological: She is alert and oriented to person, place, and time. She has normal strength. No sensory deficit.   Skin: Skin is warm and dry.        MDM  Number of Diagnoses or Management Options  Diagnosis management comments: Systolic blood pressure 638 left arm and 136 right arm.    Rule out MI and CT angiogram to evaluate for worsening dissection.  Chest x-ray initially as well.  No CVA tenderness to suggest pyelonephritis and no fever but we'll check urine.      1:35 PM  Oxygen saturation 92% on room air.  CT shows no extension or change in her aortic dissection.  Does show some bilateral infiltrates.  Discussed with the patient and she wishes to go home on oral antibiotic.       Amount and/or Complexity of Data Reviewed  Clinical lab tests: ordered and reviewed (Results for orders placed or performed during the hospital encounter of 01/05/16  -CBC WITH AUTOMATED DIFF       Result                                            Value                         Ref Range                       WBC  7.6                           4.3 - 11.1 K/uL                 RBC                                               4.49                          4.05 - 5.25 M/uL                HGB                                               12.9                          11.7 - 15.4 g/dL                HCT                                               38.4                           35.8 - 46.3 %                   MCV                                               85.5                          79.6 - 97.8 FL                  MCH                                               28.7                          26.1 - 32.9 PG                  MCHC                                              33.6                          31.4 - 35.0 g/dL  RDW                                               12.9                          11.9 - 14.6 %                   PLATELET                                          308                           150 - 450 K/uL                  MPV                                               10.1 (L)                      10.8 - 14.1 FL                  DF                                                AUTOMATED                                                     NEUTROPHILS                                       54                            43 - 78 %                       LYMPHOCYTES                                       28                            13 - 44 %                       MONOCYTES  6                             4.0 - 12.0 %                    EOSINOPHILS                                       12 (H)                        0.5 - 7.8 %                     BASOPHILS                                         0                             0.0 - 2.0 %                     IMMATURE GRANULOCYTES                             0.4                           0.0 - 5.0 %                     ABS. NEUTROPHILS                                  4.1                           1.7 - 8.2 K/UL                  ABS. LYMPHOCYTES                                  2.1                           0.5 - 4.6 K/UL                  ABS. MONOCYTES                                    0.4                           0.1 - 1.3 K/UL                  ABS. EOSINOPHILS  0.9 (H)                       0.0 - 0.8 K/UL                   ABS. BASOPHILS                                    0.0                           0.0 - 0.2 K/UL                  ABS. IMM. GRANS.                                  0.0                           0.0 - 0.5 K/UL             -METABOLIC PANEL, COMPREHENSIVE       Result                                            Value                         Ref Range                       Sodium                                            143                           136 - 145 mmol/L                Potassium                                         3.9                           3.5 - 5.1 mmol/L                Chloride                                          106                           98 - 107 mmol/L                 CO2  26                            21 - 32 mmol/L                  Anion gap                                         11                            7 - 16 mmol/L                   Glucose                                           129 (H)                       65 - 100 mg/dL                  BUN                                               13                            8 - 23 MG/DL                    Creatinine                                        0.73                          0.6 - 1.0 MG/DL                 GFR est AA                                        >60                           >60 ml/min/1.54m               GFR est non-AA                                    >60                           >60 ml/min/1.747m              Calcium  9.4                           8.3 - 10.4 MG/DL                Bilirubin, total                                  0.3                           0.2 - 1.1 MG/DL                 ALT (SGPT)                                        19                            12 - 65 U/L                     AST (SGOT)                                        12 (L)                        15 - 37 U/L                      Alk. phosphatase                                  112                           50 - 136 U/L                    Protein, total                                    8.0                           6.3 - 8.2 g/dL                  Albumin                                           3.7                           3.2 - 4.6 g/dL                  Globulin  4.3 (H)                       2.3 - 3.5 g/dL                  A-G Ratio                                         0.9 (L)                       1.2 - 3.5                  -POC TROPONIN-I       Result                                            Value                         Ref Range                       Troponin-I (POC)                                  0.01                          0.0 - 0.08 ng/ml           -POC LACTIC ACID       Result                                            Value                         Ref Range                       Lactic Acid (POC)                                 0.6                           0.5 - 1.9 mmol/L           -POC TROPONIN-I       Result                                            Value                         Ref Range                       Troponin-I (POC)  0.01                          0.0 - 0.08 ng/ml           -EKG, 12 LEAD, INITIAL       Result                                            Value                         Ref Range                       Ventricular Rate                                  72                            BPM                             Atrial Rate                                       120                           BPM                             QRS Duration                                      86                            ms                              Q-T Interval                                      374                           ms                              QTC Calculation (Bezet)                           409                            ms  Calculated R Axis                                 46                            degrees                         Calculated T Axis                                 87                            degrees                         Diagnosis                                                                                                   !!! Poor data quality, interpretation may be adversely affected   Sinus rhythm   Non-specific ST-t wave changes   Abnormal ECG   When compared with ECG of 16-Nov-2015 11:26,   Poor data quality in current ECG precludes serial comparison   Confirmed by [facility]  MD ([facility]), MATTHEW G ([id number]) on 01/05/2016 1:26:36 PM     )  Tests in the radiology section of CPT??: ordered and reviewed (Cta Chest Abd Pelv W Wo Cont    Result Date: 01/05/2016  HISTORY: Descending thoracic aortic dissection. Left chest pressure. CT angiography through the chest, abdomen, and pelvis was performed following noncontrast CT imaging. Multiplanar reformatted images and 3-D, 360 degrees rotation images were reviewed with [place]. Radiation dose reduction techniques were used for the study. Our CT scanners use one or  all of the following: Automated exposure control, adjustment of the mA and or kV according to patient size, and use of iterative reconstruction.. COMPARISON: 11/16/2015. The ascending thoracic aorta is spared of the dissection and measures 3.7 cm in AP diameter. The descending thoracic aorta measures 2.7 cm just above the starting point of the [place] type be thoracic aortic dissection. The dissection is unchanged and (in the proximal descending thoracic aorta extending into the left common iliac artery where there is a communication with the true lumen. The superior mesenteric, celiac, inferior mesenteric and bilateral main renal arteries all arise from the true lumen and are widely patent. The false lumen  contains some thrombus within it and is unchanged. The true lumen continues into the right common, internal, external iliac, proximal superficial femoral and profunda femoral arteries which are unremarkable. On the left, the true lumen is slightly compressed from the false lumen however is patent at the common iliac artery. Left internal and external iliac arteries as well as the left common femoral, proximal superficial femoral and profunda femoral arteries are all  within normal limits. Main, right left pulmonary arteries and veins are unremarkable. There is opacification seen of blood in the patent splenic, portal and bilateral renal veins as well as the suprarenal vena cava. Nonvascular structures: There has been interval progression of bilateral infiltrates involving both lower and upper lobes. This consolidation progressed and there may be early cavitation in the superior segment of the right lower lobe. Heart size is unremarkable. Again seen is a simple cyst in the lower pole of the right kidney. The liver, spleen, pancreas, gallbladder, adrenal glands are unremarkable. The stomach, small and large intestines are not distended. The pelvis, there are several calcified uterine fibroids which have undergone regression. Bladder is unremarkable. No acute osseous pathology demonstrated.     IMPRESSION: 1. 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 2. Moderate, significant aggression of bilateral pulmonary infiltrates and areas of consolidation. Clinical correlation is suggested.    Xr Chest Port    Result Date: 01/05/2016  Single portable upright chest x-ray January 05, 2016 Reference exam: Nov 16, 2015 INDICATION: Chest pain beginning last night FINDINGS: Ill-defined focal opacity is now seen in the upper right chest, cardiac silhouette and pulmonary vascularity appear normal.     IMPRESSION: What may be an infiltrate in the upper right chest. Follow-up  to assure complete clearing would be recommended.    )      ED Course       Procedures

## 2016-01-10 LAB — CULTURE, BLOOD: Culture result:: NO GROWTH

## 2016-01-14 LAB — CULTURE, BLOOD: Culture result:: NO GROWTH

## 2016-01-18 ENCOUNTER — Ambulatory Visit
Admit: 2016-01-18 | Discharge: 2016-01-18 | Payer: PRIVATE HEALTH INSURANCE | Attending: Specialist | Primary: Specialist

## 2016-01-18 DIAGNOSIS — J45909 Unspecified asthma, uncomplicated: Secondary | ICD-10-CM

## 2016-01-18 MED ORDER — CYANOCOBALAMIN (VIT B-12) 1,000 MCG/ML INJECTION KIT
1000 mcg/mL | INTRAMUSCULAR | 2 refills | Status: DC
Start: 2016-01-18 — End: 2017-02-13

## 2016-01-18 NOTE — Progress Notes (Signed)
[facility]  Suzanne Pham, M.D.  Suzanne Pham, M.D.  [address]  [place], [place]  Ph No:  [phone]  Fax:  [phone]      Chief Complaint   Patient presents with   ??? Follow-up       History of Present Illness:  Suzanne Pham is a 65 y.o. female that presents today for follow-up,    Patient with history of allergic/asthma, directed from a allergy/immunology, patient taking only Singulair, saline nasal spray, rescue MDI inhalers, patient has mild elevation of IgG levels, 239, nasal smears unremarkable, predominantly polys, mild improvement in the cough and wheezing, no reflux symptoms,    Abdominal aortic dissection, stable symptoms, blood pressure well controlled, tolerating Lopressor, Lipitor,    Stable GAD/depression, on Zoloft, Xanax, no active complaints.    GERD stable, on Zantac, no dysphasia no abdominal pain,    Allergies   Allergen Reactions   ??? Aspirin Other (comments)     "Flu-like symptoms"   ??? Cymbalta [Duloxetine] Other (comments)     pain   ??? Elavil Other (comments)     pain   ??? Forteo [Teriparatide] Other (comments)   ??? Lyrica [Pregabalin] Other (comments)     Hallucinations and falling   ??? Prolia [Denosumab] Rash   ??? Topamax [Topiramate] Other (comments)     Slurred speach   ??? Ultram [Tramadol] Drowsiness     Past Medical History:   Diagnosis Date   ??? Aorta disorder (HCC)     TYPE B   ??? Arthritis    ??? Asthma    ??? Chronic pain     Back   ??? Heart disease    ??? Osteoporosis    ??? Rib fractures     2009   ??? Vitamin B 12 deficiency    ??? Vitamin D deficiency      Past Surgical History:   Procedure Laterality Date   ??? HX BUNIONECTOMY     ??? HX TONSIL AND ADENOIDECTOMY       Family History   Problem Relation Age of Onset   ??? Cancer Mother    ??? Cancer Father      Social History     Social History   ??? Marital status: MARRIED     Spouse name: N/A   ??? Number of children: N/A   ??? Years of education: N/A     Occupational History    ??? Not on file.     Social History Main Topics   ??? Smoking status: Never Smoker   ??? Smokeless tobacco: Never Used   ??? Alcohol use No   ??? Drug use: No   ??? Sexual activity: Not on file     Other Topics Concern   ??? Not on file     Social History Narrative     Current Outpatient Prescriptions   Medication Sig Dispense Refill   ??? fluticasone-salmeterol (AIRDUO RESPICLICK) 536-64 mcg/actuation aepb Take 1 Puff by inhalation two (2) times a day.     ??? cyanocobalamin, vitamin B-12, 1,000 mcg/mL kit 1 mL by Injection route every month. 10 mL 2   ??? IBUPROFEN (MOTRIN PO) Take 600 mg by mouth daily.     ??? ALPRAZolam (XANAX) 1 mg tablet Take 1 Tab by mouth three (3) times daily as needed for Anxiety or Sleep. Max Daily Amount: 3 mg. Indications: anxiety 60 Tab 5   ??? atorvastatin (LIPITOR) 10 mg tablet  Take 1 Tab by mouth daily. 90 Tab 4   ??? metoprolol tartrate (LOPRESSOR) 25 mg tablet Take 1 Tab by mouth three (3) times daily. Hold if BP < 100 or HR <55 (Patient taking differently: Take 25 mg by mouth two (2) times a day. Hold if BP < 100 or HR <55. Three times a day if needed) 270 Tab 4   ??? FLUoxetine (PROZAC) 40 mg capsule Take 1 Cap by mouth daily. 90 Cap 3   ??? montelukast (SINGULAIR) 10 mg tablet Take 1 Tab by mouth daily. 90 Tab 3   ??? raNITIdine (ZANTAC) 150 mg tablet Take 150 mg by mouth two (2) times a day.     ??? albuterol sulfate 90 mcg/actuation aepb Take 2 Puffs by inhalation every six (6) hours as needed (wheezing).           REVIEW OF SYSTEMS:  GENERAL: negative for - chills, fatigue, fever, , malaise, night sweats,  weight gain or weight loss.    EYES: negative for  - blurred vision, double vision, photophobia, pain, discharge and redness.    ENT AND MOUTH: negative for - epistaxis,   nasal discharge, nasal polyps, oral lesions, sinus pain,  sore throat, tinnitus, vertigo, visual changes or vocal changes.    CARDIOVASCULAR: negative for - chest pain, dyspnea on exertion, edema,  irregular heartbeat, loss of consciousness    RESPIRATORY:  negative for - cough, hemoptysis,, pleuritic pain, shortness of breath, sputum changes, , tachypnea or wheezing.    GASTROINTESTINAL: negative for - abdominal pain, appetite loss, blood in stools, change in bowel habits, , constipation, diarrhea, gas/bloating, heartburn, hematemesis,     GENITOURINARY: No change in urinary stream, dysuria,  genital discharge, genital ulcers, hematuria, incontinence, irregular/heavy menses, nocturia, pelvic pain, , urinary frequency/urgency or vulvar/vaginal symptoms. No flank pain.    NEUROLOGICAL: negative for - behavioral changes,   confusion, dizziness, gait disturbance, headaches, impaired coordination/balance, memory loss, numbness/tingling, seizures, speech problems, tremors, .  PSYCHIATRIC: negative for - anxiety, behavioral disorder, concentration difficulties, decreased libido, depression,  hallucinations, hostility, irritability, memory difficulties, mood swings, obsessive thoughts,     MUSCULOSKELETAL: negative for - , joint pain, joint stiffness, joint swelling, muscle pain, muscle weakness.    INTEGUMENTARY (BREASTS): negative ,      ENDOCRINE: negative polydipsia, polyuria, polydypsia, cold-heat intolerance,weight loss,.    HEM/LYMPH:  bruising, bleeding, lymph node enlargement,     ALLERGY/IMMUNOLOGIC: negative,    SKIN: Negative .    PHYSICAL EXAM  General appearance - alert, well appearing, and in no distress    Mental status - alert, oriented to person, place, and time    Eyes - pupils equal and reactive, extraocular eye movements intact    Ears - bilateral TM's and external ear canals normal    Nose - normal and patent, no erythema, discharge or polyps    Neck - supple, no significant adenopathy, carotids upstroke normal bilaterally, no bruits, thyroid exam: thyroid is normal in size without nodules or tenderness    Throat / Mouth - no erythema, no tonsillar exudate, normal dental hygiene, no ulcers     Chest - clear to auscultation, no wheezes, rales or rhonchi, symmetric air entry    Heart - normal rate and regular rhythm, S1 and S2 normal, no gallops noted, no murmur    Abdomen - soft, nontender, nondistended, no masses or organomegaly    Back exam - full range of motion, no tenderness, palpable spasm or pain.  Neurological - alert, oriented, normal speech, no focal findings or movement disorder noted    Musculoskeletal - no joint tenderness, deformity or swelling    Extremities - peripheral pulses normal, no pedal edema, no clubbing or cyanosis    Skin - normal coloration and turgor, no rashes, no suspicious skin lesions noted      There is no immunization history on file for this patient.       LABS:   Results for orders placed or performed during the hospital encounter of 01/05/16   CULTURE, BLOOD   Result Value Ref Range    Special Requests: LEFT ANTECUBITAL      Culture result: NO GROWTH 5 DAYS     CULTURE, BLOOD   Result Value Ref Range    Special Requests: LEFT ANTECUBITAL      Culture result: NO GROWTH 5 DAYS     CBC WITH AUTOMATED DIFF   Result Value Ref Range    WBC 7.6 4.3 - 11.1 K/uL    RBC 4.49 4.05 - 5.25 M/uL    HGB 12.9 11.7 - 15.4 g/dL    HCT 38.4 35.8 - 46.3 %    MCV 85.5 79.6 - 97.8 FL    MCH 28.7 26.1 - 32.9 PG    MCHC 33.6 31.4 - 35.0 g/dL    RDW 12.9 11.9 - 14.6 %    PLATELET 308 150 - 450 K/uL    MPV 10.1 (L) 10.8 - 14.1 FL    DF AUTOMATED      NEUTROPHILS 54 43 - 78 %    LYMPHOCYTES 28 13 - 44 %    MONOCYTES 6 4.0 - 12.0 %    EOSINOPHILS 12 (H) 0.5 - 7.8 %    BASOPHILS 0 0.0 - 2.0 %    IMMATURE GRANULOCYTES 0.4 0.0 - 5.0 %    ABS. NEUTROPHILS 4.1 1.7 - 8.2 K/UL    ABS. LYMPHOCYTES 2.1 0.5 - 4.6 K/UL    ABS. MONOCYTES 0.4 0.1 - 1.3 K/UL    ABS. EOSINOPHILS 0.9 (H) 0.0 - 0.8 K/UL    ABS. BASOPHILS 0.0 0.0 - 0.2 K/UL    ABS. IMM. GRANS. 0.0 0.0 - 0.5 K/UL   METABOLIC PANEL, COMPREHENSIVE   Result Value Ref Range    Sodium 143 136 - 145 mmol/L    Potassium 3.9 3.5 - 5.1 mmol/L     Chloride 106 98 - 107 mmol/L    CO2 26 21 - 32 mmol/L    Anion gap 11 7 - 16 mmol/L    Glucose 129 (H) 65 - 100 mg/dL    BUN 13 8 - 23 MG/DL    Creatinine 0.73 0.6 - 1.0 MG/DL    GFR est AA >60 >60 ml/min/1.57m    GFR est non-AA >60 >60 ml/min/1.755m   Calcium 9.4 8.3 - 10.4 MG/DL    Bilirubin, total 0.3 0.2 - 1.1 MG/DL    ALT (SGPT) 19 12 - 65 U/L    AST (SGOT) 12 (L) 15 - 37 U/L    Alk. phosphatase 112 50 - 136 U/L    Protein, total 8.0 6.3 - 8.2 g/dL    Albumin 3.7 3.2 - 4.6 g/dL    Globulin 4.3 (H) 2.3 - 3.5 g/dL    A-G Ratio 0.9 (L) 1.2 - 3.5     POC TROPONIN-I   Result Value Ref Range    Troponin-I (POC) 0.01 0.0 - 0.08 ng/ml   POC LACTIC ACID  Result Value Ref Range    Lactic Acid (POC) 0.6 0.5 - 1.9 mmol/L   POC TROPONIN-I   Result Value Ref Range    Troponin-I (POC) 0.01 0.0 - 0.08 ng/ml   EKG, 12 LEAD, INITIAL   Result Value Ref Range    Ventricular Rate 72 BPM    Atrial Rate 120 BPM    QRS Duration 86 ms    Q-T Interval 374 ms    QTC Calculation (Bezet) 409 ms    Calculated R Axis 46 degrees    Calculated T Axis 87 degrees    Diagnosis       !!! Poor data quality, interpretation may be adversely affected  Sinus rhythm  Non-specific ST-t wave changes  Abnormal ECG  When compared with ECG of 16-Nov-2015 11:26,  Poor data quality in current ECG precludes serial comparison  Confirmed by [facility]  MD ([facility]), MATTHEW G ([id number]) on 01/05/2016 1:26:36 PM           IMPRESSION    ICD-10-CM ICD-9-CM    1. Asthma, moderate J45.998 493.90    2. BMI 26.0-26.9,adult Z68.26 V85.22    3. Essential hypertension, benign I10 401.1    4. Vitamin B 12 deficiency E53.8 266.2 cyanocobalamin, vitamin B-12, 1,000 mcg/mL kit   5. 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 Seasonal allergic rhinitis due to pollen J30.1 477.0    7. Obstructive sleep apnea G47.33 327.23          PLAN  ?? Allergic/asthma, mild elevation of IgE, continue maintenance  inhalers/rescue MDI/singular, reviewed the record from allergy/immunology,  ??   ?? Stable hypertension, continue beta-blocker/Lipitor as directed,   ??   ?? Continue B12 supplementation as directed.  ??   ?? Stable GAD/depressive symptoms continue Zoloft, Xanax as needed basis, advised the patient to call me if any worsening symptoms.      [id number] Suzanne Kos, MD

## 2016-02-15 ENCOUNTER — Ambulatory Visit
Admit: 2016-02-15 | Discharge: 2016-02-15 | Payer: PRIVATE HEALTH INSURANCE | Attending: Specialist | Primary: Specialist

## 2016-02-15 ENCOUNTER — Ambulatory Visit: Attending: Specialist | Primary: Specialist

## 2016-02-15 DIAGNOSIS — J45909 Unspecified asthma, uncomplicated: Secondary | ICD-10-CM

## 2016-02-15 DIAGNOSIS — J45998 Other asthma: Secondary | ICD-10-CM

## 2016-02-15 NOTE — Progress Notes (Signed)
Patient continues to have cough, [place] [facility] [place]  [facility] [place] showed left upper infiltrate, worsening, with mediastinal/tracheal lymph nodes, patient continues to have worsening of cough, shortness of breath, discussed the CT findings, advised the patient to go to emergency room in [place], for further evaluation of pneumonia/possible lung mass,

## 2016-02-15 NOTE — Progress Notes (Signed)
[facility]  Campbell Riches, M.D.  Sudhirkumar C. Posey Pronto, M.D.  [place], [place]  Ph No:  [phone]  Fax:  [phone]      Chief Complaint   Patient presents with   ??? Follow-up     wants to make sure pneumonia is clear       History of Present Illness:  Ms. Freimuth is a 65 y.o. female that presents today for follow-up,    Patient has mild to moderate improvement in the symptoms of cough wheezing shortness of breath, patient worried about pneumonia since she was exposed to family members who had pneumonia, strong family history of lung cancer both mother and the father, daughter diagnosed with breast cancer, patient very much concerned, no weight loss,    Seen allergy/immunology, records not available, improved shortness of breath with [facility], no new complaints, except shortness of breath    Allergies   Allergen Reactions   ??? Aspirin Other (comments)     "Flu-like symptoms"   ??? Cymbalta [Duloxetine] Other (comments)     pain   ??? Elavil Other (comments)     pain   ??? Forteo [Teriparatide] Other (comments)   ??? Lyrica [Pregabalin] Other (comments)     Hallucinations and falling   ??? Prolia [Denosumab] Rash   ??? Topamax [Topiramate] Other (comments)     Slurred speach   ??? Ultram [Tramadol] Drowsiness     Past Medical History:   Diagnosis Date   ??? Aorta disorder (HCC)     TYPE B   ??? Arthritis    ??? Asthma    ??? Chronic pain     Back   ??? Heart disease    ??? Osteoporosis    ??? Rib fractures     2009   ??? Vitamin B 12 deficiency    ??? Vitamin D deficiency      Past Surgical History:   Procedure Laterality Date   ??? HX BUNIONECTOMY     ??? HX TONSIL AND ADENOIDECTOMY       Family History   Problem Relation Age of Onset   ??? Cancer Mother    ??? Cancer Father      Social History     Social History   ??? Marital status: MARRIED     Spouse name: N/A   ??? Number of children: N/A   ??? Years of education: N/A     Occupational History   ??? Not on file.      Social History Main Topics   ??? Smoking status: Never Smoker   ??? Smokeless tobacco: Never Used   ??? Alcohol use No   ??? Drug use: No   ??? Sexual activity: Not on file     Other Topics Concern   ??? Not on file     Social History Narrative     Current Outpatient Prescriptions   Medication Sig Dispense Refill   ??? fluticasone-salmeterol (AIRDUO RESPICLICK) 166-06 mcg/actuation aepb Take 1 Puff by inhalation two (2) times a day.     ??? cyanocobalamin, vitamin B-12, 1,000 mcg/mL kit 1 mL by Injection route every month. 10 mL 2   ??? IBUPROFEN (MOTRIN PO) Take 600 mg by mouth daily.     ??? ALPRAZolam (XANAX) 1 mg tablet Take 1 Tab by mouth three (3) times daily as needed for Anxiety or Sleep. Max Daily Amount: 3 mg. Indications: anxiety 60 Tab 5   ???  metoprolol tartrate (LOPRESSOR) 25 mg tablet Take 1 Tab by mouth three (3) times daily. Hold if BP < 100 or HR <55 (Patient taking differently: Take 25 mg by mouth two (2) times a day. Hold if BP < 100 or HR <55. Three times a day if needed) 270 Tab 4   ??? FLUoxetine (PROZAC) 40 mg capsule Take 1 Cap by mouth daily. 90 Cap 3   ??? montelukast (SINGULAIR) 10 mg tablet Take 1 Tab by mouth daily. 90 Tab 3   ??? raNITIdine (ZANTAC) 150 mg tablet Take 150 mg by mouth two (2) times a day.     ??? albuterol sulfate 90 mcg/actuation aepb Take 2 Puffs by inhalation every six (6) hours as needed (wheezing).     ??? atorvastatin (LIPITOR) 10 mg tablet Take 1 Tab by mouth daily. 90 Tab 4         REVIEW OF SYSTEMS:  GENERAL: negative for - chills, fatigue, fever, , malaise, night sweats,  weight gain or weight loss.    EYES: negative for  - blurred vision, double vision, photophobia, pain, discharge and redness.    ENT AND MOUTH: negative for - epistaxis,   nasal discharge, nasal polyps, oral lesions, sinus pain,  sore throat, tinnitus, vertigo, visual changes or vocal changes.    CARDIOVASCULAR: negative for - chest pain, dyspnea on exertion, edema, irregular heartbeat, loss of consciousness     RESPIRATORY: Positive symptoms as above  GASTROINTESTINAL: negative for - abdominal pain, appetite loss, blood in stools, change in bowel habits, , constipation, diarrhea, gas/bloating, heartburn, hematemesis,     GENITOURINARY: No change in urinary stream, dysuria,  genital discharge, genital ulcers, hematuria, incontinence, irregular/heavy menses, nocturia, pelvic pain, , urinary frequency/urgency or vulvar/vaginal symptoms. No flank pain.    NEUROLOGICAL: negative for - behavioral changes,   confusion, dizziness, gait disturbance, headaches, impaired coordination/balance, memory loss, numbness/tingling, seizures, speech problems, tremors, .      PHYSICAL EXAM  General appearance - alert, well appearing, and in no distress    Mental status - alert, oriented to person, place, and time    Ears - bilateral TM's and external ear canals normal    Nose - normal and patent, no erythema, discharge or polyps    Neck - supple, no significant adenopathy, carotids upstroke normal bilaterally, no bruits, thyroid exam: thyroid is normal in size without nodules or tenderness    Throat / Mouth - no erythema, no tonsillar exudate, normal dental hygiene, no ulcers    Chest - clear to auscultation, no wheezes, rales or rhonchi, symmetric air entry    Heart - normal rate and regular rhythm, S1 and S2 normal, no gallops noted, no murmur    Abdomen - soft, nontender, nondistended, no masses or organomegaly    Neurological - alert, oriented, normal speech, no focal findings or movement disorder noted    Musculoskeletal - no joint tenderness, deformity or swelling    Extremities - peripheral pulses normal, no pedal edema, no clubbing or cyanosis    Skin - normal coloration and turgor, no rashes, no suspicious skin lesions noted      There is no immunization history on file for this patient.       LABS:   Results for orders placed or performed during the hospital encounter of 01/05/16   CULTURE, BLOOD   Result Value Ref Range     Special Requests: LEFT ANTECUBITAL      Culture result: NO GROWTH 5 DAYS  CULTURE, BLOOD   Result Value Ref Range    Special Requests: LEFT ANTECUBITAL      Culture result: NO GROWTH 5 DAYS     CBC WITH AUTOMATED DIFF   Result Value Ref Range    WBC 7.6 4.3 - 11.1 K/uL    RBC 4.49 4.05 - 5.25 M/uL    HGB 12.9 11.7 - 15.4 g/dL    HCT 38.4 35.8 - 46.3 %    MCV 85.5 79.6 - 97.8 FL    MCH 28.7 26.1 - 32.9 PG    MCHC 33.6 31.4 - 35.0 g/dL    RDW 12.9 11.9 - 14.6 %    PLATELET 308 150 - 450 K/uL    MPV 10.1 (L) 10.8 - 14.1 FL    DF AUTOMATED      NEUTROPHILS 54 43 - 78 %    LYMPHOCYTES 28 13 - 44 %    MONOCYTES 6 4.0 - 12.0 %    EOSINOPHILS 12 (H) 0.5 - 7.8 %    BASOPHILS 0 0.0 - 2.0 %    IMMATURE GRANULOCYTES 0.4 0.0 - 5.0 %    ABS. NEUTROPHILS 4.1 1.7 - 8.2 K/UL    ABS. LYMPHOCYTES 2.1 0.5 - 4.6 K/UL    ABS. MONOCYTES 0.4 0.1 - 1.3 K/UL    ABS. EOSINOPHILS 0.9 (H) 0.0 - 0.8 K/UL    ABS. BASOPHILS 0.0 0.0 - 0.2 K/UL    ABS. IMM. GRANS. 0.0 0.0 - 0.5 K/UL   METABOLIC PANEL, COMPREHENSIVE   Result Value Ref Range    Sodium 143 136 - 145 mmol/L    Potassium 3.9 3.5 - 5.1 mmol/L    Chloride 106 98 - 107 mmol/L    CO2 26 21 - 32 mmol/L    Anion gap 11 7 - 16 mmol/L    Glucose 129 (H) 65 - 100 mg/dL    BUN 13 8 - 23 MG/DL    Creatinine 0.73 0.6 - 1.0 MG/DL    GFR est AA >60 >60 ml/min/1.60m    GFR est non-AA >60 >60 ml/min/1.734m   Calcium 9.4 8.3 - 10.4 MG/DL    Bilirubin, total 0.3 0.2 - 1.1 MG/DL    ALT (SGPT) 19 12 - 65 U/L    AST (SGOT) 12 (L) 15 - 37 U/L    Alk. phosphatase 112 50 - 136 U/L    Protein, total 8.0 6.3 - 8.2 g/dL    Albumin 3.7 3.2 - 4.6 g/dL    Globulin 4.3 (H) 2.3 - 3.5 g/dL    A-G Ratio 0.9 (L) 1.2 - 3.5     POC TROPONIN-I   Result Value Ref Range    Troponin-I (POC) 0.01 0.0 - 0.08 ng/ml   POC LACTIC ACID   Result Value Ref Range    Lactic Acid (POC) 0.6 0.5 - 1.9 mmol/L   POC TROPONIN-I   Result Value Ref Range    Troponin-I (POC) 0.01 0.0 - 0.08 ng/ml   EKG, 12 LEAD, INITIAL    Result Value Ref Range    Ventricular Rate 72 BPM    Atrial Rate 120 BPM    QRS Duration 86 ms    Q-T Interval 374 ms    QTC Calculation (Bezet) 409 ms    Calculated R Axis 46 degrees    Calculated T Axis 87 degrees    Diagnosis       !!! Poor data quality, interpretation may be adversely affected  Sinus rhythm  Non-specific ST-t wave changes  Abnormal ECG  When compared with ECG of 16-Nov-2015 11:26,  Poor data quality in current ECG precludes serial comparison  Confirmed by [facility]  MD ([facility]), MATTHEW G ([id number]) on 01/05/2016 1:26:36 PM           IMPRESSION    ICD-10-CM ICD-9-CM    1. Asthma, moderate J45.998 493.90 XR CHEST PA LAT   2. BMI 26.0-26.9,adult Z68.26 V85.22    3. 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 Vitamin B 12 deficiency E53.8 266.2    6. Essential hypertension, benign I10 401.1    7. SOB (shortness of breath) R06.02 786.05 XR CHEST PA LAT      CANCELED: CT CHEST W CONT   8. Postmenopausal Z78.0 V49.81 AMB POC DEXA,BONE DENSITY,AXIAL SKELETON   9. Nodule of left lung R91.1 793.11 CT CHEST W CONT      CANCELED: CT CHEST W CONT         PLAN  ?? Left upper midlung density noted, questionable infiltrate versus mass, scheduled for CT of the chest,  ??   ?? Scheduled for bone density,  ??   ?? Continue maintenance/rescue inhalers as directed,  ??   ?? Status post completed dissection distal to the left subclavian, blood pressure well controlled, continue present medication as above.      [id number] Theodoro Kos, MD

## 2016-02-19 ENCOUNTER — Inpatient Hospital Stay: Admit: 2016-02-19 | Payer: MEDICARE | Attending: Specialist | Primary: Specialist

## 2016-02-19 DIAGNOSIS — R911 Solitary pulmonary nodule: Secondary | ICD-10-CM

## 2016-02-19 MED ORDER — SODIUM CHLORIDE 0.9% BOLUS IV
0.9 % | Freq: Once | INTRAVENOUS | Status: AC
Start: 2016-02-19 — End: 2016-02-19
  Administered 2016-02-19: 12:00:00 via INTRAVENOUS

## 2016-02-19 MED ORDER — IOPAMIDOL 76 % IV SOLN
370 mg iodine /mL (76 %) | Freq: Once | INTRAVENOUS | Status: AC
Start: 2016-02-19 — End: 2016-02-19
  Administered 2016-02-19: 12:00:00 via INTRAVENOUS

## 2016-02-19 MED ORDER — SALINE PERIPHERAL FLUSH PRN
Freq: Once | INTRAMUSCULAR | Status: AC
Start: 2016-02-19 — End: 2016-02-19
  Administered 2016-02-19: 12:00:00

## 2016-03-01 ENCOUNTER — Ambulatory Visit
Admit: 2016-03-01 | Discharge: 2016-03-01 | Payer: PRIVATE HEALTH INSURANCE | Attending: Pulmonary Disease | Primary: Specialist

## 2016-03-01 DIAGNOSIS — J45909 Unspecified asthma, uncomplicated: Secondary | ICD-10-CM

## 2016-03-01 LAB — AMB POC SPIROMETRY

## 2016-03-01 NOTE — Progress Notes (Signed)
[address] [place] [address]  [phone]    Patient Name:  Suzanne Pham  Date of Birth:  1950/08/30      Office Visit 03/01/2016    CHIEF COMPLAINT:    Chief Complaint   Patient presents with   ??? Breathing Problem       HISTORY OF PRESENT ILLNESS:    Patient is 65 years old white female who is here today in the referral of Dr. Lyndel Pleasure evaluation of pneumonia abnormal CT scan and, shortness of breath.  Patient has quite complicated  Pulmonary medical history.  She used to live in [place] and around 2004 she was diagnosed with asthma.  She is to see pulmonary doctor in [place].  She was on different inhalers throughout the years.  She was doing fairly well however when she moved here a few years ago she started to have some problems again she also saw a pulmonary doctor over [facility] but she does not recall the name.  She was told initially she does not have asthma then she was told she has asthma.  She has been placed on prednisone every few weeks on and off for cough breathing.  She does not recall she ever had any bronchoscopy.  She does not recall she had CT scan over [facility].  She has not been to that practice for about a year.  She has been having problems with cough on and off for at least a year.  Mostly is nonproductive cough without acute with occasional clear sputum production.  She does have occasional wheezing.  When she takes prednisone she feels it helps with her breathing however not with her cough.  She was on Symbicort in the past and now she is on airduo respiclick.  She started on that very recently.  Recently she was traveling to [facility] to visit her daughter and she ended up in the hospital.  She was told she had pneumonia.  She reports she was told she has pneumonia in May and July and now this month.  Patient also has history of obstructive sleep apnea.  She was diagnosed in 2004 and she is done well initially when she reports she  really could feel the different when she was still not wearing a seatbelt when she was wearing a CPAP.  However over the last few years she does not feel any different either she wears it or not.  She has not seen any sleep doctors recently.  She also reports he does not exercise regularly.  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  She also reports which she does not mention throughout the anterior however at the end when I already discussed the plan will be going to do with her she suddenly starts talking about this pain she has in the left chest after she had collapsed lung related to accident and she has terrible pain and nobody is helping her with her pain.  She is doing well from medication she goes to pain doctors but nobody has been able to help her.  She expressed that she has no trusting doctors because does nobody seems to be helping her.  She expressed that she has lots of going on in her life and she seems quite anxious.    Past Medical History:   Diagnosis Date   ??? 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 Arthritis    ??? Asthma    ??? Chronic pain     Back   ??? Heart disease    ??? Osteoporosis    ??? Rib fractures     2009   ??? Vitamin B 12 deficiency    ??? Vitamin D deficiency          Problem List  Date Reviewed: Mar 04, 2016          Codes Class Noted    Chest pain, unspecified ICD-10-CM: R07.9  ICD-9-CM: 786.50  11/23/2015        Essential hypertension, benign ICD-10-CM: I10  ICD-9-CM: 401.1  11/23/2015        Aortic dissection distal to left subclavian [facility]) ICD-10-CM: I71.01  ICD-9-CM: 441.00  07/15/2015        Major depressive disorder with single episode, in partial remission ([place]) ICD-10-CM: F32.4  ICD-9-CM: 296.25  07/15/2015        Asthma, moderate ICD-10-CM: J45.998  ICD-9-CM: 493.90  07/15/2015        Osteoporosis ICD-10-CM: M81.0  ICD-9-CM: 733.00  07/15/2015        History of rib fracture ICD-10-CM: Z87.81  ICD-9-CM: V15.51  07/15/2015         Vitamin B 12 deficiency ICD-10-CM: E53.8  ICD-9-CM: 266.2  07/15/2015        Chronic pain syndrome ICD-10-CM: G89.4  ICD-9-CM: 338.4  07/15/2015        Arthralgia ICD-10-CM: M25.50  ICD-9-CM: 719.40  07/15/2015                Past Surgical History:   Procedure Laterality Date   ??? HX BUNIONECTOMY     ??? HX TONSIL AND ADENOIDECTOMY         No flowsheet data found.      Social History     Social History   ??? Marital status: MARRIED     Spouse name: N/A   ??? Number of children: N/A   ??? Years of education: N/A     Occupational History   ??? Not on file.     Social History Main Topics   ??? Smoking status: Never Smoker   ??? Smokeless tobacco: Never Used   ??? Alcohol use No   ??? Drug use: No   ??? Sexual activity: Not on file     Other Topics Concern   ??? Not on file     Social History Narrative         Family History   Problem Relation Age of Onset   ??? Cancer Mother    ??? Cancer Father          Allergies   Allergen Reactions   ??? Aspirin Other (comments)     "Flu-like symptoms"   ??? Cymbalta [Duloxetine] Other (comments)     pain   ??? Elavil Other (comments)     pain   ??? Forteo [Teriparatide] Other (comments)   ??? Lyrica [Pregabalin] Other (comments)     Hallucinations and falling   ??? Prolia [Denosumab] Rash   ??? Topamax [Topiramate] Other (comments)     Slurred speach   ??? Ultram [Tramadol] Drowsiness         Current Outpatient Prescriptions   Medication Sig   ??? cpap machine kit by Does Not Apply route.   ??? fluticasone-salmeterol (AIRDUO RESPICLICK) 956-38 mcg/actuation aepb Take 1 Puff by inhalation two (2) times a day.   ??? cyanocobalamin, vitamin B-12, 1,000 mcg/mL kit 1  mL by Injection route every month.   ??? ALPRAZolam (XANAX) 1 mg tablet Take 1 Tab by mouth three (3) times daily as needed for Anxiety or Sleep. Max Daily Amount: 3 mg. Indications: anxiety   ??? metoprolol tartrate (LOPRESSOR) 25 mg tablet Take 1 Tab by mouth three (3) times daily. Hold if BP < 100 or HR <55 (Patient taking differently:  Take 25 mg by mouth two (2) times a day. Hold if BP < 100 or HR <55. Three times a day if needed)   ??? FLUoxetine (PROZAC) 40 mg capsule Take 1 Cap by mouth daily.   ??? montelukast (SINGULAIR) 10 mg tablet Take 1 Tab by mouth daily.   ??? raNITIdine (ZANTAC) 150 mg tablet Take 150 mg by mouth two (2) times a day.   ??? albuterol sulfate 90 mcg/actuation aepb Take 2 Puffs by inhalation every six (6) hours as needed (wheezing).   ??? atorvastatin (LIPITOR) 10 mg tablet Take 1 Tab by mouth daily.     No current facility-administered medications for this visit.            REVIEW OF SYSTEMS:   CONSTITUTIONAL:   There is no history of fever, chills, night sweats, weight loss, weight gain, persistent fatigue, or lethargy/hypersomnolence.   EYES:   Denies problems with eye pain, erythema, blurred vision, or visual field loss.   ENTM:   Denies history of tinnitus, epistaxis, sore throat, hoarseness, or dysphonia.   LYMPH:   Denies swollen glands.   CARDIAC:   No chest pain, pressure, discomfort, palpitations, orthopnea, murmurs, or edema.   GI:   No dysphagia, heartburn reflux, nausea/vomiting, diarrhea, abdominal pain, or bleeding.   GU:   Denies history of dysuria, hematuria, polyuria, or decreased urine output.   MS:   No history of myalgias, arthralgias, bone pain, or muscle cramps.   SKIN:   No history of rashes, jaundice, cyanosis, nodules, or ulcers.   ENDO:   Negative for heat or cold intolerance.  No history of DM.   PSYCH:   Negative for anxiety, depression, insomnia, hallucinations.   NEURO:   There is no history of AMS, persistent headache, decreased level of consciousness, seizures, or motor or sensory deficits.      PHYSICAL EXAM:    Visit Vitals   ??? BP 109/83   ??? Pulse 80   ??? Temp 97.8 ??F (36.6 ??C) (Oral)   ??? Resp 16   ??? Ht 5' 4.3" (1.633 m)   ??? Wt 158 lb 3.2 oz (71.8 kg)   ??? SpO2 99%   ??? BMI 26.9 kg/m2        GENERAL APPEARANCE:   The patient is normal weight and in no respiratory distress.   HEENT:    PERRL.  Conjunctivae unremarkable.   Nasal mucosa is without epistaxis, exudate, or polyps.  Gums and dentition are unremarkable.  There is no oropharyngeal narrowing.  TMs are clear.   NECK/LYMPHATIC:   Symmetrical with no elevation of jugular venous pulsation.  Trachea midline. No thyroid enlargement.  No cervical adenopathy.   LUNGS:   Normal respiratory effort with symmetrical lung expansion.   Breath sounds coarse   HEART:   There is a regular rate and rhythm.  No murmur, rub, or gallop.  There is no edema in the lower extremities.   ABDOMEN:   Soft and non-tender.  No hepatosplenomegaly.  Bowel sounds are normal.     SKIN:   There are no rashes, cyanosis, jaundice, or  ecchymosis present.   EXTREMITIES:   The extremities are unremarkable without clubbing, cyanosis, joint inflammation, degenerative, or ischemic change.   MUSCULOSKELETAL:   There is no abnormal tone, muscle atrophy, or abnormal movement present.   NEURO:   The patient is alert and oriented to person, place, and time.  Memory appears intact and mood is normal.  No gross sensorimotor deficits are present.      DIAGNOSTIC TESTS:   CT of chest:       Spirometry: Mild restriction     Exercise oximetry: Resting SPO2 on room air 98% with heart rate of 80 with ambulation her SPO2 dropped to 95% with heart rate of 92    ASSESSMENT:  (Medical Decision Making)         ICD-10-CM ICD-9-CM    1. Asthma, moderate J45.998 493.90  she has been diagnosed with asthma in 2004       2. Pulmonary infiltrate R91.8 793.19 SCHEDULE PROCEDURE      AMB POC PLETHYSMOGRAPHY LUNG VOLUMES W/WO AIRWAY RESIST      AMB POC SPIROMETRY W/BRONCHODILATOR      AMB POC SPIROMETRY      AMB POC DIFFUSING CAPACITY      AMB POC GAS DILUTION(LUNG VOLUMES)      INHAL RX, AIRWAY OBST/DX SPUTUM INDUCT      ANA BY MULTIPLEX FLOW IA, QL        I have reviewed her chest CT scan which shows worsening infiltrates in the left upper lobe however she has some on the right side as well.  Her CT  scan is worse than it was a few months ago.  She also has worsening of her adenopathy.  She does not recall she ever had bronchoscopy with biopsies and seems like those infiltrates were there in the past at some point however not as significant.  She will need to be set up for bronchoscopy with egress and transbronchial biopsies.       3. Abnormal chest CT R93.8 793.2 SCHEDULE PROCEDURE   4. OSA (obstructive sleep apnea) G47.33 327.23   She has not been compliant with her CPAP last few years and she needs probably new study and referral to sleep lab to see if she presses on her super has to be adjusted she has symptoms of snoring, daytime hypersomnolence morning headaches and choking at night            PLAN:    -She will need complete pulmonary  function test  -He will be set up for  for bronchoscopy with EBUS and TBLB  -I will check some basic labs including ESR, ANA, RA, ANKA panel, HP panel  -She will be referred to sleep lab to help her with her mask       Orders Placed This Encounter   ??? AMB POC SPIROMETRY - [id number]   ??? SCHEDULE PROCEDURE   ??? AMB POC PLETHYSMOGRAPHY LUNG VOLUMES W/WO AIRWAY RESIST ([id number])   ??? AMB POC SPIROMETRY W/BRONCHODILATOR ([id number])   ??? AMB POC SPIROMETRY ([id number])   ??? AMB POC DIFFUSING CAPACITY ([id number])   ??? AMB POC GAS DILUTION(LUNG VOLUMES) ([id number])   ??? INHAL RX, AIRWAY OBST/DX SPUTUM INDUCT ([id number])   ??? ANA BY MULTIPLEX FLOW IA, QL   ??? RHEUMATOID FACTOR, QL   ??? SED RATE [facility] lab/[facility] order)   ??? ANCA PANEL   ??? HYPERSENS PNEUMONITIS I   ??? HYPERSENSITIVITY PNEUMONITIS II   ??? REFERRAL TO SLEEP STUDIES  Over 50% of today's office visit was spent in face to face time reviewing test results/records, prognosis, importance of compliance, education about disease process, benefits of medications, instructions for management of acute flare-ups, and follow up plans.  Total time spent was 60 minutes.       Teressa Lower, MD  Electronically signed     Dictated using voice recognition software.  Proof read but unrecognized errors may exist.

## 2016-03-15 NOTE — Progress Notes (Signed)
Dr. Waynette Buttery reviewed the images and approved an EBUS with a TBLB.  I called the patient to scheduled.  She declined.  She stated she does not want to go through with this at this time.  If she changes her mind, she will call us.

## 2016-03-21 NOTE — Progress Notes (Signed)
Called pt to get scheduled as a pal pul follow up. She has had a ss before through [facility]. Will be having her most recent ss sent to [place] as soon as possible. Will schedule accordingly. Dr. Anitra Lauth is ref provider. Pt is on CPAP. She has had 2 sleep studies, one in 2004 and most recent in 2013.

## 2016-03-23 ENCOUNTER — Encounter: Admit: 2016-03-23 | Discharge: 2016-03-23 | Payer: PRIVATE HEALTH INSURANCE | Primary: Specialist

## 2016-03-23 ENCOUNTER — Encounter

## 2016-03-23 DIAGNOSIS — Z780 Asymptomatic menopausal state: Secondary | ICD-10-CM

## 2016-03-23 NOTE — Progress Notes (Signed)
bmd  cv

## 2016-03-30 ENCOUNTER — Encounter: Attending: Pulmonary Disease | Primary: Specialist

## 2016-03-30 ENCOUNTER — Encounter: Primary: Specialist

## 2016-04-13 ENCOUNTER — Encounter: Primary: Specialist

## 2016-04-18 ENCOUNTER — Encounter: Primary: Specialist

## 2016-04-25 ENCOUNTER — Encounter: Payer: PRIVATE HEALTH INSURANCE | Attending: Specialist | Primary: Specialist

## 2016-06-21 MED ORDER — FLUOXETINE 40 MG CAP
40 mg | ORAL_CAPSULE | ORAL | 0 refills | Status: DC
Start: 2016-06-21 — End: 2016-07-11

## 2016-07-06 ENCOUNTER — Ambulatory Visit
Admit: 2016-07-06 | Discharge: 2016-07-06 | Payer: PRIVATE HEALTH INSURANCE | Attending: Cardiovascular Disease | Primary: Specialist

## 2016-07-06 DIAGNOSIS — I71019 Dissection of thoracic aorta, unspecified: Secondary | ICD-10-CM

## 2016-07-06 NOTE — Progress Notes (Signed)
[address], [address]  [place] [address]  PHONE: [phone]  [name]  Nov 18, 1950    SUBJECTIVE:   Suzanne Pham is a 65 y.o. female seen for a follow up visit regarding the following:     No chief complaint on file.      HPI:    HPI patient is back for a six-month visit.  He is status post a descending aortic dissection.  She was treated with metoprolol.  Blood pressures been well controlled.  She has had general malaise and fatigue.  We stopped her last visit but she really did not feel any better.  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  She has been on prednisone.  Later in the process of trying a slow taper.  She is gained weight with that.  Still has a lot of back pain.  No severe interscapular pain however.  She states the CT scan at [facility] in the vascular surgery clinic is felt to be improving and the dissection is healing.    Past Medical History, Past Surgical History, Family history, Social History, and Medications were all reviewed with the patient today and updated as necessary.     Prior to Admission medications    Medication Sig Start Date End Date Taking? Authorizing Provider   predniSONE (DELTASONE) 10 mg tablet Take  by mouth daily (with breakfast).   Yes Historical Provider   predniSONE (DELTASONE) 5 mg tablet Take 5 mg by mouth daily.   Yes Historical Provider   FLUoxetine (PROZAC) 40 mg capsule TAKE 1 CAPSULE BY MOUTH  DAILY 06/21/16  Yes [id number] Theodoro Kos, MD   cpap machine kit by Does Not Apply route.   Yes Historical Provider   cyanocobalamin, vitamin B-12, 1,000 mcg/mL kit 1 mL by Injection route every month. 01/18/16  Yes [id number] Theodoro Kos, MD   ALPRAZolam Duanne Moron) 1 mg tablet Take 1 Tab by mouth three (3) times daily as needed for Anxiety or Sleep. Max Daily Amount: 3 mg. Indications: anxiety 11/16/15  Yes Siddesha Theodoro Kos, MD    atorvastatin (LIPITOR) 10 mg tablet Take 1 Tab by mouth daily. 11/16/15  Yes [id number] Theodoro Kos, MD   metoprolol tartrate (LOPRESSOR) 25 mg tablet Take 1 Tab by mouth three (3) times daily. Hold if BP < 100 or HR <55  Patient taking differently: Take 25 mg by mouth two (2) times a day. Hold if BP < 100 or HR <55. Three times a day if needed 11/16/15  Yes Siddesha Theodoro Kos, MD     Allergies   Allergen Reactions   ??? Aspirin Other (comments)     "Flu-like symptoms"   ??? Cymbalta [Duloxetine] Other (comments)     pain   ??? Elavil Other (comments)     pain   ??? Forteo [Teriparatide] Other (comments)   ??? Lyrica [Pregabalin] Other (comments)     Hallucinations and falling   ??? Prolia [Denosumab] Rash   ??? Topamax [Topiramate] Other (comments)     Slurred speach   ??? Ultram [Tramadol] Drowsiness     Past Medical History:   Diagnosis Date   ??? Aorta disorder ([facility])     TYPE B   ??? Arthritis    ??? Asthma    ??? Chronic pain     Back   ??? Heart disease    ??? Osteoporosis    ??? Rib fractures  2009   ??? Vitamin B 12 deficiency    ??? Vitamin D deficiency      Past Surgical History:   Procedure Laterality Date   ??? HX BUNIONECTOMY     ??? HX TONSIL AND ADENOIDECTOMY       Family History   Problem Relation Age of Onset   ??? Cancer Mother    ??? Cancer Father        ROS:    Review of Systems   Constitution: Positive for malaise/fatigue and weight gain.   Cardiovascular: Negative for chest pain, dyspnea on exertion, leg swelling, orthopnea and paroxysmal nocturnal dyspnea.   Respiratory: Positive for shortness of breath.    Musculoskeletal: Positive for back pain.   Gastrointestinal: Negative.    Psychiatric/Behavioral: Positive for depression.           PHYSICAL EXAM:    Visit Vitals   ??? BP 124/82   ??? Pulse 78   ??? Ht 5' 4.3" (1.633 m)   ??? Wt 174 lb 14.4 oz (79.3 kg)   ??? BMI 29.74 kg/m2          Wt Readings from Last 3 Encounters:   07/06/16 174 lb 14.4 oz (79.3 kg)   03/01/16 158 lb 3.2 oz (71.8 kg)   02/15/16 157 lb (71.2 kg)      BP Readings from Last 3 Encounters:   07/06/16 124/82   03/01/16 109/83   02/15/16 101/63         Physical Exam   Constitutional: She appears well-developed and well-nourished.   Neck: No JVD present.   Cardiovascular: Normal rate, regular rhythm, normal heart sounds and intact distal pulses.    Pulmonary/Chest: Breath sounds normal.   Abdominal: Soft. There is no tenderness.   Musculoskeletal: She exhibits no edema.   Skin: Skin is warm and dry.   Psychiatric:   Flat affect       Medical problems and test results were reviewed with the patient today.       Lab Results   Component Value Date/Time    BUN 13 01/05/2016 09:15 AM     Lab Results   Component Value Date/Time    Creatinine 0.73 01/05/2016 09:15 AM     Lab Results   Component Value Date/Time    Potassium 3.9 01/05/2016 09:15 AM         Lab Results   Component Value Date/Time    Cholesterol, total 172 07/15/2015 10:27 AM    HDL Cholesterol 32 07/15/2015 10:27 AM    LDL, calculated 107 07/15/2015 10:27 AM    VLDL, calculated 33 07/15/2015 10:27 AM    Triglyceride 166 07/15/2015 10:27 AM         ASSESSMENT and PLAN    Diagnoses and all orders for this visit:    1. Aortic dissection distal to left subclavian ([facility])    2. Essential hypertension, benign          IMPRESSION:     I reviewed some outside records and got updated on her other medical problems.  Blood pressure seems well controlled.  I did not change her medications on today's visit.        Follow-up Disposition:  Return in about 6 months (around 01/04/2017).          Nechama Guard, MD  07/06/2016  8:19 AM

## 2016-07-11 ENCOUNTER — Ambulatory Visit
Admit: 2016-07-11 | Discharge: 2016-07-11 | Payer: PRIVATE HEALTH INSURANCE | Attending: Specialist | Primary: Specialist

## 2016-07-11 DIAGNOSIS — Z6829 Body mass index (BMI) 29.0-29.9, adult: Secondary | ICD-10-CM

## 2016-07-11 LAB — AMB POC URINALYSIS DIP STICK AUTO W/O MICRO
Bilirubin (UA POC): NEGATIVE
Glucose (UA POC): NEGATIVE mg/dL
Ketones (UA POC): NEGATIVE
Nitrites (UA POC): NEGATIVE
Protein (UA POC): NEGATIVE
Specific gravity (UA POC): 1.005 (ref 1.001–1.035)
Urobilinogen (UA POC): 0.2 (ref 0.2–1)
pH (UA POC): 6 (ref 4.6–8.0)

## 2016-07-11 MED ORDER — CIPROFLOXACIN 500 MG TAB
500 mg | ORAL_TABLET | Freq: Two times a day (BID) | ORAL | 0 refills | Status: DC
Start: 2016-07-11 — End: 2016-07-11

## 2016-07-11 MED ORDER — CIPROFLOXACIN 500 MG TAB
500 mg | ORAL_TABLET | Freq: Two times a day (BID) | ORAL | 0 refills | Status: DC
Start: 2016-07-11 — End: 2016-08-17

## 2016-07-11 MED ORDER — ALPRAZOLAM 1 MG TAB
1 mg | ORAL_TABLET | Freq: Three times a day (TID) | ORAL | 5 refills | Status: DC | PRN
Start: 2016-07-11 — End: 2017-02-13

## 2016-07-11 MED ORDER — ATORVASTATIN 10 MG TAB
10 mg | ORAL_TABLET | Freq: Every day | ORAL | 4 refills | Status: DC
Start: 2016-07-11 — End: 2017-02-13

## 2016-07-11 MED ORDER — FLUOXETINE 20 MG CAP
20 mg | ORAL_CAPSULE | Freq: Every day | ORAL | 2 refills | Status: DC
Start: 2016-07-11 — End: 2016-10-31

## 2016-07-11 NOTE — Progress Notes (Signed)
[facility]  Campbell Riches, M.D.  Sudhirkumar C. Posey Pronto, M.D.  [place], [place]  Ph No:  [phone]  Fax:  [phone]      Chief Complaint   Patient presents with   ??? Bladder Infection       History of Present Illness:  Suzanne Pham is a 66 y.o. 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 daily,    Patient treated for UTI, taken 5 days course of Macrodantin, patient continues to have suprapubic discomfort, dysuria, no fever, no chills,    Stable GAD symptoms, new prescription for Xanax,    Stable sleep apnea, using a CPAP on regular basis,        Allergies   Allergen Reactions   ??? Aspirin Other (comments)     "Flu-like symptoms"   ??? Cymbalta [Duloxetine] Other (comments)     pain   ??? Elavil Other (comments)     pain   ??? Forteo [Teriparatide] Other (comments)   ??? Lyrica [Pregabalin] Other (comments)     Hallucinations and falling   ??? Prolia [Denosumab] Rash   ??? Topamax [Topiramate] Other (comments)     Slurred speach   ??? Ultram [Tramadol] Drowsiness     Past Medical History:   Diagnosis Date   ??? Aorta disorder (HCC)     TYPE B   ??? Arthritis    ??? Asthma    ??? Chronic pain     Back   ??? Heart disease    ??? Osteoporosis    ??? Rib fractures     2009   ??? Vitamin B 12 deficiency    ??? Vitamin D deficiency      Past Surgical History:   Procedure Laterality Date   ??? HX BUNIONECTOMY     ??? HX TONSIL AND ADENOIDECTOMY       Family History   Problem Relation Age of Onset   ??? Cancer Mother    ??? Cancer Father      Social History     Social History   ??? Marital status: MARRIED     Spouse name: N/A    ??? Number of children: N/A   ??? Years of education: N/A     Occupational History   ??? Not on file.     Social History Main Topics   ??? Smoking status: Never Smoker   ??? Smokeless tobacco: Never Used   ??? Alcohol use No   ??? Drug use: No   ??? Sexual activity: Not on file     Other Topics Concern   ??? Not on file     Social History Narrative     Current Outpatient Prescriptions   Medication Sig Dispense Refill   ??? ALPRAZolam (XANAX) 1 mg tablet Take 1 Tab by mouth three (3) times daily as needed for Anxiety or Sleep. Max Daily Amount: 3 mg. Indications: anxiety 60 Tab 5   ??? atorvastatin (LIPITOR) 10 mg tablet Take 1 Tab by mouth daily. 90 Tab 4   ??? FLUoxetine (PROZAC) 20 mg capsule Take 1 Cap by mouth daily. 90 Cap  2   ??? ciprofloxacin HCl (CIPRO) 500 mg tablet Take 1 Tab by mouth two (2) times a day. 14 Tab 0   ??? predniSONE (DELTASONE) 10 mg tablet Take  by mouth daily (with breakfast).     ??? predniSONE (DELTASONE) 5 mg tablet Take 5 mg by mouth daily.     ??? cpap machine kit by Does Not Apply route.     ??? cyanocobalamin, vitamin B-12, 1,000 mcg/mL kit 1 mL by Injection route every month. 10 mL 2   ??? metoprolol tartrate (LOPRESSOR) 25 mg tablet Take 1 Tab by mouth three (3) times daily. Hold if BP < 100 or HR <55 (Patient taking differently: Take 25 mg by mouth two (2) times a day. Hold if BP < 100 or HR <55. Three times a day if needed) 270 Tab 4         REVIEW OF SYSTEMS:    GENERAL: negative for - chills, fatigue, fever, hot flashes, malaise, night sweats, sleep disturbance, weight gain or weight loss.    EYES: negative for  - blurred vision, double vision, photophobia, pain, discharge and redness.    ENT AND MOUTH: negative for - epistaxis, headaches, hearing change, nasal   discharge, nasal polyps, oral lesions, facial pain,, sneezing, sore throat, tinnitus, vertigo, visual changes or vocal changes.    CARDIOVASCULAR: negative for - chest pain, dyspnea on exertion, pedal  edema, irregular heartbeat, loss of consciousness,  orthopnea, palpitations, paroxysmal nocturnal dyspnea, resting shortness of breath.    RESPIRATORY:  negative for - cough wheezing, hemoptysis, orthopnea, pleuritic pain, shortness of breath, sputum changes,  tachypnea     GASTROINTESTINAL: negative for -  appetite loss, blood in stools, change in bowel habits, change in stools, constipation, diarrhea, gas/bloating, heartburn, hematemesis, melena, nausea/vomiting, stool incontinence or swallowing difficulty    GENITOURINARY FEMALE: negative for -change in menstrual cycle, change in urinary stream, dysmenorrhea, dyspareuniagenital discharge, genital ulcers, hematuria, incontinence, irregular/heavy menses, pelvic pain,  and decrease in libido     NEUROLOGICAL: negative for - behavioral changes, bowel and bladder control changes, confusion, dizziness, gait disturbance, headaches, sensory changes, motor weakness, seizures, speech problems, tremors, visual changes or weakness    PSYCHIATRIC: negative for - anxiety, behavioral disorder, concentration difficulties, , depressive symptoms, disorientation, hallucinations, delusions, irritability, memory difficulties, mood swings, obsessive thoughts, physical abuse, sleep disturbances or suicidal ideation.    MUSCULOSKELETAL: negative for - gait disturbance, joint pain, joint stiffness, joint swelling, muscle pain, muscular weakness,     INTEGUMENTARY (BREASTS): negative for - a breast mass, a palpable lump in the breast, breast pain, nipple discharge     ENDOCRINE: negative for polydipsia, polyuria, polydypsia, cold-heat intolerance,  hot flashes,    HEM/LYMPH: negative for anemia, bruising, bleeding, lymph node enlargement,     ALLERGY/IMMUNOLOGIC: negative for allergies to medicine, food, dyes, hepatitis,    PHYSICAL EXAM  General appearance - alert, well appearing, and in no distress    Mental status - alert, oriented to person, place, and time     Eyes - pupils equal and reactive, extraocular eye movements intact    Ears - bilateral TM's and external ear canals normal    Nose - normal and patent, no erythema, discharge or polyps    Neck - supple, no significant adenopathy, carotids upstroke normal bilaterally, no bruits, thyroid exam: thyroid is normal in size without nodules or tenderness    Throat / Mouth - no erythema, no tonsillar exudate, normal dental hygiene, no ulcers  Chest - clear to auscultation, no wheezes, rales or rhonchi, symmetric air entry    Heart - normal rate and regular rhythm, S1 and S2 normal, no gallops noted, no murmur    Abdomen - soft, suprapubic tenderness, nondistended, no masses or organomegaly    Back exam - full range of motion, no tenderness, palpable spasm or pain.    Neurological - alert, oriented, normal speech, no focal findings or movement disorder noted    Musculoskeletal - no joint tenderness, deformity or swelling    Extremities - peripheral pulses normal, no pedal edema, no clubbing or cyanosis    Skin - normal coloration and turgor, no rashes, no suspicious skin lesions noted    Immunization History   Administered Date(s) Administered   ??? Influenza Vaccine 04/10/2015, 04/09/2016   ??? Pneumococcal Polysaccharide (PPSV-23) 07/10/2010          LABS:   Results for orders placed or performed in visit on 03/01/16   AMB POC SPIROMETRY   Result Value Ref Range    FEV1  L    FEV1 % Pred  %    FVC  L    FVC % Pred  %    FEV1/FVC  %         ASSESSMENT AND PLAN  Diagnoses and all orders for this visit:    1. BMI 29.0-29.9,adult    2. Recurrent major depressive disorder, in full remission (HCC)  -     FLUoxetine (PROZAC) 20 mg capsule; Take 1 Cap by mouth daily.    3. Urinary tract infection without hematuria, site unspecified  -     AMB POC URINALYSIS DIP STICK AUTO W/O MICRO (CIM)  -     ciprofloxacin HCl (CIPRO) 500 mg tablet; Take 1 Tab by mouth two (2) times a day.    4. GAD (generalized anxiety disorder)   -     ALPRAZolam (XANAX) 1 mg tablet; Take 1 Tab by mouth three (3) times daily as needed for Anxiety or Sleep. Max Daily Amount: 3 mg. Indications: anxiety    5. Vitamin B 12 deficiency    6. Chronic pain syndrome    7. Pure hypercholesterolemia  -     atorvastatin (LIPITOR) 10 mg tablet; Take 1 Tab by mouth daily.    8. 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 prednisone as directed, get the records from [facility], encourage the patient is calcium and vitamin D, continue B12 supplementation as directed,    Continue beta-blocker/statins as directed.    Decrease fluoxetine to 20 mg p.o. daily, continue Xanax as directed.    Check UA/urine culture, change to Cipro, advised the patient to call me if any worsening symptoms.      [id number] Theodoro Kos, MD

## 2016-08-17 ENCOUNTER — Ambulatory Visit
Admit: 2016-08-17 | Discharge: 2016-08-17 | Payer: PRIVATE HEALTH INSURANCE | Attending: Specialist | Primary: Specialist

## 2016-08-17 DIAGNOSIS — J8281 Chronic eosinophilic pneumonia: Secondary | ICD-10-CM

## 2016-08-17 NOTE — Progress Notes (Signed)
[facility]  Campbell Riches, M.D.  Sudhirkumar C. Posey Pronto, M.D.  [address]  [place], [place]  Ph No:  [phone]  Fax:  [phone]      Chief Complaint   Patient presents with   ??? Confidential       History of Present Illness:  Suzanne Pham is a 66 y.o. female that presents today for follow-up, stable  chronic eosinophilic pneumonia, patient still on prednisone, mild worsening of GAD symptoms/depressive symptoms, otherwise patient doing fairly well,    Blood pressure running high, denies any chest pain or shortness of breath, patient on Lopressor 25 mg twice a day,        Allergies   Allergen Reactions   ??? Aspirin Other (comments)     "Flu-like symptoms"   ??? Cymbalta [Duloxetine] Other (comments)     pain   ??? Elavil Other (comments)     pain   ??? Forteo [Teriparatide] Other (comments)   ??? Lyrica [Pregabalin] Other (comments)     Hallucinations and falling   ??? Prolia [Denosumab] Rash   ??? Topamax [Topiramate] Other (comments)     Slurred speach   ??? Ultram [Tramadol] Drowsiness     Past Medical History:   Diagnosis Date   ??? Aorta disorder (HCC)     TYPE B   ??? Arthritis    ??? Asthma    ??? Chronic pain     Back   ??? Heart disease    ??? Osteoporosis    ??? Rib fractures     2009   ??? Vitamin B 12 deficiency    ??? Vitamin D deficiency      Past Surgical History:   Procedure Laterality Date   ??? HX BUNIONECTOMY     ??? HX TONSIL AND ADENOIDECTOMY       Family History   Problem Relation Age of Onset   ??? Cancer Mother    ??? Cancer Father      Social History     Social History   ??? Marital status: MARRIED     Spouse name: N/A   ??? Number of children: N/A   ??? Years of education: N/A     Occupational History   ??? Not on file.     Social History Main Topics   ??? Smoking status: Never Smoker   ??? Smokeless tobacco: Never Used   ??? Alcohol use No   ??? Drug use: No   ??? Sexual activity: Not on file     Other Topics Concern   ??? Not on file     Social History Narrative      Current Outpatient Prescriptions   Medication Sig Dispense Refill   ??? ALPRAZolam (XANAX) 1 mg tablet Take 1 Tab by mouth three (3) times daily as needed for Anxiety or Sleep. Max Daily Amount: 3 mg. Indications: anxiety 60 Tab 5   ??? atorvastatin (LIPITOR) 10 mg tablet Take 1 Tab by mouth daily. 90 Tab 4   ??? FLUoxetine (PROZAC) 20 mg capsule Take 1 Cap by mouth daily. 90 Cap 2   ??? predniSONE (DELTASONE) 10 mg tablet Take  by mouth daily (with breakfast).     ??? cpap machine kit by Does Not Apply route.     ??? cyanocobalamin, vitamin B-12, 1,000 mcg/mL kit 1 mL by Injection route every month. 10 mL 2   ??? metoprolol tartrate (LOPRESSOR) 25 mg tablet Take 1 Tab by mouth three (  3) times daily. Hold if BP < 100 or HR <55 (Patient taking differently: Take 25 mg by mouth two (2) times a day. Hold if BP < 100 or HR <55. Three times a day if needed) 270 Tab 4         REVIEW OF SYSTEMS:    GENERAL: negative for - chills, fatigue, fever, hot flashes, malaise, night sweats, sleep disturbance, weight gain or weight loss.    EYES: negative for  - blurred vision, double vision, photophobia, pain, discharge and redness.    ENT AND MOUTH: negative for - epistaxis, headaches, hearing change, nasal   discharge, nasal polyps, oral lesions, facial pain,, sneezing, sore throat, tinnitus, vertigo, visual changes or vocal changes.    CARDIOVASCULAR: negative for - chest pain, dyspnea on exertion, pedal edema, irregular heartbeat, loss of consciousness,  orthopnea, palpitations, paroxysmal nocturnal dyspnea, resting shortness of breath.    RESPIRATORY:  negative for - cough wheezing, hemoptysis, orthopnea, pleuritic pain, shortness of breath, sputum changes,  tachypnea     GASTROINTESTINAL: negative for - abdominal pain, appetite loss, blood in stools, change in bowel habits, change in stools, constipation, diarrhea, gas/bloating, heartburn, hematemesis, melena, nausea/vomiting, stool incontinence or swallowing difficulty     GENITOURINARY FEMALE: negative for -change in menstrual cycle, change in urinary stream, dysmenorrhea, dyspareunia, dysuria, genital discharge, genital ulcers, hematuria, incontinence, irregular/heavy menses, pelvic pain, urinary frequency/urgency and decrease in libido     NEUROLOGICAL: negative for - behavioral changes, bowel and bladder control changes, confusion, dizziness, gait disturbance, headaches, sensory changes, motor weakness, seizures, speech problems, tremors, visual changes or weakness        PHYSICAL EXAM  General appearance - alert, well appearing, and in no distress    Mental status - alert, oriented to person, place, and time    Ears - bilateral TM's and external ear canals normal    Nose - normal and patent, no erythema, discharge or polyps    Neck - supple, no significant adenopathy, carotids upstroke normal bilaterally, no bruits, thyroid exam: thyroid is normal in size without nodules or tenderness    Throat / Mouth - no erythema, no tonsillar exudate, normal dental hygiene, no ulcers    Chest - clear to auscultation, no wheezes, rales or rhonchi, symmetric air entry    Heart - normal rate and regular rhythm, S1 and S2 normal, no gallops noted, no murmur    Abdomen - soft, nontender, nondistended, no masses or organomegaly    Neurological - alert, oriented, normal speech, no focal findings or movement disorder noted    Musculoskeletal - no joint tenderness, deformity or swelling    Extremities - peripheral pulses normal, no pedal edema, no clubbing or cyanosis    Skin - normal coloration and turgor, no rashes, no suspicious skin lesions noted    Immunization History   Administered Date(s) Administered   ??? Influenza Vaccine 04/10/2015, 04/09/2016   ??? Pneumococcal Polysaccharide (PPSV-23) 07/10/2010          LABS:   Results for orders placed or performed in visit on 07/11/16   AMB POC URINALYSIS DIP STICK AUTO W/O MICRO ([facility])   Result Value Ref Range    Color (UA POC) Yellow      Clarity (UA POC) Clear     Glucose (UA POC) Negative Negative mg/dL    Bilirubin (UA POC) Negative Negative    Ketones (UA POC) Negative Negative    Specific gravity (UA POC) 1.005 1.001 - 1.035  Blood (UA POC) Moderate Negative    pH (UA POC) 6.0 4.6 - 8.0    Protein (UA POC) Negative Negative    Urobilinogen (UA POC) 0.2 mg/dL 0.2 - 1    Nitrites (UA POC) Negative Negative    Leukocyte esterase (UA POC) Small Negative         ASSESSMENT AND PLAN  Diagnoses and all orders for this visit:    1. 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 Moderate asthma without complication, unspecified whether persistent    3. Major depressive disorder with single episode, in partial remission ([place])    4. Aortic dissection distal to left subclavian (HCC)    5. GAD (generalized anxiety disorder)        Increase fluoxetine to 40 mg p.o. daily, increase Lopressor to 50 mg twice a day, advised the patient to call if any worsening symptoms.      [id number] Theodoro Kos, MD

## 2016-08-24 ENCOUNTER — Encounter: Attending: Specialist | Primary: Specialist

## 2016-09-20 ENCOUNTER — Encounter: Attending: Specialist | Primary: Specialist

## 2016-09-22 ENCOUNTER — Encounter: Payer: PRIVATE HEALTH INSURANCE | Attending: Specialist | Primary: Specialist

## 2016-10-31 ENCOUNTER — Ambulatory Visit
Admit: 2016-10-31 | Discharge: 2016-10-31 | Payer: PRIVATE HEALTH INSURANCE | Attending: Specialist | Primary: Specialist

## 2016-10-31 DIAGNOSIS — J8281 Chronic eosinophilic pneumonia: Secondary | ICD-10-CM

## 2016-10-31 MED ORDER — FLUOXETINE 20 MG CAP
20 mg | ORAL_CAPSULE | Freq: Every day | ORAL | 2 refills | Status: DC
Start: 2016-10-31 — End: 2017-02-13

## 2016-10-31 MED ORDER — ERGOCALCIFEROL (VITAMIN D2) 50,000 UNIT CAP
1250 mcg (50,000 unit) | ORAL_CAPSULE | ORAL | 12 refills | Status: DC
Start: 2016-10-31 — End: 2017-07-27

## 2016-10-31 NOTE — ACP (Advance Care Planning) (Signed)
Advanced Care Planning was discussed with patient.  YES   Patient states that they have a Living Will.  YES  Patient advised to bring a copy of Living Will for Medical Records.  YES   Patient advised on the advantages of having a Living Will and having a copy in their Medical Records.  YES   Patient provided with Advanced Care Planning information.  NO

## 2016-10-31 NOTE — Progress Notes (Signed)
[facility]  Campbell Riches, M.D.  Sudhirkumar C. Posey Pronto, M.D.  [place], [place]  Ph No:  [phone]  Fax:  [phone]      Chief Complaint   Patient presents with   ??? Labs     fasting        History of Present Illness:  Ms. Passey is a 66 y.o. 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, positive sniff test, persistent infiltrates on chest x-ray, secondary to chronic eosinophilic pneumonia, patient on intermittent steroids use, tolerating Flovent well,    Stable GAD/depression, on fluoxetine, Xanax, needed refill for fluoxetine,    Persistent left rib pain secondary to fracture in the past many years ago, negative workup, partial response to acupuncture/cupping,    Osteopenia, patient using calcium and vitamin D on regular basis,    Aortic dissection, stable, tolerating beta-blocker well, denies any chest pain or shortness of breath, tolerating statins well,        Allergies   Allergen Reactions   ??? Aspirin Other (comments)     "Flu-like symptoms"   ??? Cymbalta [Duloxetine] Other (comments)     pain   ??? Elavil Other (comments)     pain   ??? Forteo [Teriparatide] Other (comments)   ??? Lyrica [Pregabalin] Other (comments)     Hallucinations and falling   ??? Prolia [Denosumab] Rash   ??? Topamax [Topiramate] Other (comments)     Slurred speach   ??? Ultram [Tramadol] Drowsiness     Past Medical History:   Diagnosis Date   ??? Aorta disorder (HCC)     TYPE B   ??? Arthritis    ??? Asthma    ??? Chronic pain     Back   ??? Heart disease    ??? Osteoporosis    ??? Rib fractures     2009   ??? Vitamin B 12 deficiency    ??? Vitamin D deficiency      Past Surgical History:   Procedure Laterality Date   ??? HX BUNIONECTOMY     ??? HX TONSIL AND ADENOIDECTOMY       Family History   Problem Relation Age of Onset   ??? Cancer Mother    ??? Cancer Father       Social History     Social History   ??? Marital status: MARRIED     Spouse name: N/A   ??? Number of children: N/A   ??? Years of education: N/A     Occupational History   ??? Not on file.     Social History Main Topics   ??? Smoking status: Never Smoker   ??? Smokeless tobacco: Never Used   ??? Alcohol use No   ??? Drug use: No   ??? Sexual activity: Not on file     Other Topics Concern   ??? Not on file     Social History Narrative     Current Outpatient Prescriptions   Medication Sig Dispense Refill   ??? FLOVENT HFA 220 mcg/actuation inhaler Take 220 mcg by inhalation two (2) times a day.  11   ??? ergocalciferol (ERGOCALCIFEROL) 50,000 unit capsule Take 1 Cap by mouth every seven (7) days. 12 Cap 12   ??? FLUoxetine (PROZAC) 20 mg capsule Take 1 Cap by mouth daily. 90 Cap 2   ???  cpap machine kit by Does Not Apply route.     ??? ALPRAZolam (XANAX) 1 mg tablet Take 1 Tab by mouth three (3) times daily as needed for Anxiety or Sleep. Max Daily Amount: 3 mg. Indications: anxiety 60 Tab 5   ??? atorvastatin (LIPITOR) 10 mg tablet Take 1 Tab by mouth daily. 90 Tab 4   ??? cyanocobalamin, vitamin B-12, 1,000 mcg/mL kit 1 mL by Injection route every month. 10 mL 2   ??? metoprolol tartrate (LOPRESSOR) 25 mg tablet Take 1 Tab by mouth three (3) times daily. Hold if BP < 100 or HR <55 (Patient taking differently: Take 25 mg by mouth two (2) times a day. Hold if BP < 100 or HR <55. Three times a day if needed) 270 Tab 4         REVIEW OF SYSTEMS:    GENERAL: negative for - chills, fatigue, fever, hot flashes, malaise, night sweats, sleep disturbance, weight gain or weight loss.    EYES: negative for  - blurred vision, double vision, photophobia, pain, discharge and redness.    ENT AND MOUTH: negative for - epistaxis, headaches, hearing change, nasal   discharge, nasal polyps, oral lesions, facial pain,, sneezing, sore throat, tinnitus, vertigo, visual changes or vocal changes.    CARDIOVASCULAR: negative for - chest pain, dyspnea on exertion, pedal  edema, irregular heartbeat, loss of consciousness,  orthopnea, palpitations, paroxysmal nocturnal dyspnea, resting shortness of breath.    RESPIRATORY:  negative for - cough wheezing, hemoptysis, orthopnea, pleuritic pain, shortness of breath, sputum changes,  tachypnea     GASTROINTESTINAL: negative for - abdominal pain, appetite loss, blood in stools, change in bowel habits, change in stools, constipation, diarrhea, gas/bloating, heartburn, hematemesis, melena, nausea/vomiting, stool incontinence or swallowing difficulty    GENITOURINARY FEMALE: negative for -change in menstrual cycle, change in urinary stream, dysmenorrhea, dyspareunia, dysuria, genital discharge, genital ulcers, hematuria, incontinence, irregular/heavy menses, pelvic pain, urinary frequency/urgency and decrease in libido     NEUROLOGICAL: negative for - behavioral changes, bowel and bladder control changes, confusion, dizziness, gait disturbance, headaches, sensory changes, motor weakness, seizures, speech problems, tremors, visual changes or weakness    PSYCHIATRIC: negative for - anxiety, behavioral disorder, concentration difficulties, , depressive symptoms, disorientation, hallucinations, delusions, irritability, memory difficulties, mood swings, obsessive thoughts, physical abuse, sleep disturbances or suicidal ideation.    MUSCULOSKELETAL: Positive symptoms as above    INTEGUMENTARY (BREASTS): negative for - a breast mass, a palpable lump in the breast, breast pain, nipple discharge     ENDOCRINE: negative for polydipsia, polyuria, polydypsia, cold-heat intolerance,  hot flashes,    HEM/LYMPH: negative for anemia, bruising, bleeding, lymph node enlargement,     ALLERGY/IMMUNOLOGIC: negative for allergies to medicine, food, dyes, hepatitis,    PHYSICAL EXAM  General appearance - alert, well appearing, and in no distress    Mental status - alert, oriented to person, place, and time     Eyes - pupils equal and reactive, extraocular eye movements intact    Ears - bilateral TM's and external ear canals normal    Nose - normal and patent, no erythema, discharge or polyps    Neck - supple, no significant adenopathy, carotids upstroke normal bilaterally, no bruits, thyroid exam: thyroid is normal in size without nodules or tenderness    Throat / Mouth - no erythema, no tonsillar exudate, normal dental hygiene, no ulcers    Chest - clear to auscultation, no wheezes, rales or rhonchi, symmetric air entry, left rib tenderness,  Heart - normal rate and regular rhythm, S1 and S2 normal, no gallops noted, no murmur    Abdomen - soft, nontender, nondistended, no masses or organomegaly    Back exam - full range of motion, no tenderness, palpable spasm or pain.    Neurological - alert, oriented, normal speech, no focal findings or movement disorder noted    Musculoskeletal - no joint tenderness, deformity or swelling    Extremities - peripheral pulses normal, no pedal edema, no clubbing or cyanosis    Skin - normal coloration and turgor, no rashes, no suspicious skin lesions noted    Immunization History   Administered Date(s) Administered   ??? Influenza Vaccine 04/10/2015, 04/09/2016   ??? Pneumococcal Polysaccharide (PPSV-23) 07/10/2010, 05/02/2014          LABS:   Results for orders placed or performed in visit on 07/11/16   AMB POC URINALYSIS DIP STICK AUTO W/O MICRO (CIM)   Result Value Ref Range    Color (UA POC) Yellow     Clarity (UA POC) Clear     Glucose (UA POC) Negative Negative mg/dL    Bilirubin (UA POC) Negative Negative    Ketones (UA POC) Negative Negative    Specific gravity (UA POC) 1.005 1.001 - 1.035    Blood (UA POC) Moderate Negative    pH (UA POC) 6.0 4.6 - 8.0    Protein (UA POC) Negative Negative    Urobilinogen (UA POC) 0.2 mg/dL 0.2 - 1    Nitrites (UA POC) Negative Negative    Leukocyte esterase (UA POC) Small Negative         ASSESSMENT AND PLAN   Diagnoses and all orders for this visit:    1. Chronic eosinophilic pneumonia ([place])    2. BMI 30.0-30.9,adult    3. Vitamin B 12 deficiency  -     VITAMIN B12    4. Moderate asthma without complication, unspecified whether persistent  -     AMB POC COMPLETE CBC,AUTOMATED ENTER  -     COLLECTION VENOUS BLOOD,VENIPUNCTURE  -     AMB POC URINALYSIS DIP STICK AUTO W/O MICRO (CIM)  -     METABOLIC PANEL, COMPREHENSIVE  -     LIPID PANEL  -     VITAMIN B12  -     ergocalciferol (ERGOCALCIFEROL) 50,000 unit capsule; Take 1 Cap by mouth every seven (7) days.    5. Age related osteoporosis, unspecified pathological fracture presence  -     TSH 3RD GENERATION    6. GAD (generalized anxiety disorder)  -     AMB POC COMPLETE CBC,AUTOMATED ENTER  -     COLLECTION VENOUS BLOOD,VENIPUNCTURE  -     AMB POC URINALYSIS DIP STICK AUTO W/O MICRO (CIM)  -     METABOLIC PANEL, COMPREHENSIVE  -     LIPID PANEL  -     VITAMIN B12  -     ergocalciferol (ERGOCALCIFEROL) 50,000 unit capsule; Take 1 Cap by mouth every seven (7) days.    7. Rib pain    8. Aortic dissection distal to left subclavian (HCC)  -     AMB POC COMPLETE CBC,AUTOMATED ENTER  -     COLLECTION VENOUS BLOOD,VENIPUNCTURE  -     AMB POC URINALYSIS DIP STICK AUTO W/O MICRO (CIM)  -     METABOLIC PANEL, COMPREHENSIVE  -     LIPID PANEL  -     VITAMIN B12  -  ergocalciferol (ERGOCALCIFEROL) 50,000 unit capsule; Take 1 Cap by mouth every seven (7) days.  -     TSH 3RD GENERATION    9. Vitamin D deficiency  -     ergocalciferol (ERGOCALCIFEROL) 50,000 unit capsule; Take 1 Cap by mouth every seven (7) days.  -     TSH 3RD GENERATION    10. Osteopenia, unspecified location    11. Recurrent major depressive disorder, in full remission (HCC)  -     FLUoxetine (PROZAC) 20 mg capsule; Take 1 Cap by mouth daily.        Continue fluoxetine/Xanax as directed, given refill today,    Osteopenia, continue calcium/vitamin D as directed.     Continue Flovent, patient being followed at [facility], for chronic eosinophilic pneumonia, follow-up recommendation as per pulmonology,    Check vitamin B12 level, continue B12 supplementation as directed.      [id number] Theodoro Kos, MD

## 2016-11-01 LAB — AMB POC COMPLETE CBC,AUTOMATED ENTER
ABS. GRANS (POC): 3.2 10*3/uL (ref 1.4–6.5)
ABS. LYMPHS (POC): 2.8 10*3/uL (ref 1.2–3.4)
ABS. MONOS (POC): 0.3 10*3/uL (ref 0.1–0.6)
GRANULOCYTES (POC): 50.8 % (ref 42.2–75.2)
HCT (POC): 40.5 % (ref 35–60)
HGB (POC): 13.4 g/dL (ref 11–18)
LYMPHOCYTES (POC): 45 % (ref 20.5–51.1)
MCH (POC): 29.4 pg (ref 27–31)
MCHC (POC): 33.1 g/dL (ref 33–37)
MCV (POC): 88.7 fL (ref 80–99.9)
MONOCYTES (POC): 4.2 % (ref 1.7–9.3)
MPV (POC): 9.7 fL (ref 7.8–11)
PLATELET (POC): 198 10*3/uL (ref 150–450)
RBC (POC): 4.566 10*6/uL (ref 4–6)
RDW (POC): 12.9 % (ref 11.6–13.7)
WBC (POC): 6.3 10*3/uL (ref 4.5–10.5)

## 2016-11-01 LAB — METABOLIC PANEL, COMPREHENSIVE
A-G Ratio: 2.1 (ref 1.2–2.2)
ALT (SGPT): 18 IU/L (ref 0–32)
AST (SGOT): 25 IU/L (ref 0–40)
Albumin: 4.2 g/dL (ref 3.6–4.8)
Alk. phosphatase: 93 IU/L (ref 39–117)
BUN/Creatinine ratio: 17 (ref 12–28)
BUN: 12 mg/dL (ref 8–27)
Bilirubin, total: 0.3 mg/dL (ref 0.0–1.2)
CO2: 23 mmol/L (ref 18–29)
Calcium: 9.2 mg/dL (ref 8.7–10.3)
Chloride: 105 mmol/L (ref 96–106)
Creatinine: 0.69 mg/dL (ref 0.57–1.00)
GFR est AA: 106 mL/min/{1.73_m2} (ref >59)
GFR est non-AA: 92 mL/min/{1.73_m2} (ref >59)
GLOBULIN, TOTAL: 2 g/dL (ref 1.5–4.5)
Glucose: 93 mg/dL (ref 65–99)
Potassium: 4.2 mmol/L (ref 3.5–5.2)
Protein, total: 6.2 g/dL (ref 6.0–8.5)
Sodium: 142 mmol/L (ref 134–144)

## 2016-11-01 LAB — TSH 3RD GENERATION: TSH: 3.41 u[IU]/mL (ref 0.450–4.500)

## 2016-11-01 LAB — LIPID PANEL
Cholesterol, total: 144 mg/dL (ref 100–199)
HDL Cholesterol: 37 mg/dL — ABNORMAL LOW (ref >39)
LDL, calculated: 58 mg/dL (ref 0–99)
Triglyceride: 243 mg/dL — ABNORMAL HIGH (ref 0–149)
VLDL, calculated: 49 mg/dL — ABNORMAL HIGH (ref 5–40)

## 2016-11-01 LAB — AMB POC URINALYSIS DIP STICK AUTO W/O MICRO
Bilirubin (UA POC): NEGATIVE
Blood (UA POC): NEGATIVE
Glucose (UA POC): NEGATIVE mg/dL
Ketones (UA POC): NEGATIVE
Leukocyte esterase (UA POC): NEGATIVE
Nitrites (UA POC): NEGATIVE
Protein (UA POC): NEGATIVE
Specific gravity (UA POC): 1.015 (ref 1.001–1.035)
Urobilinogen (UA POC): 0.2 (ref 0.2–1)
pH (UA POC): 5.5 (ref 4.6–8.0)

## 2016-11-01 LAB — VITAMIN B12: Vitamin B12: 256 pg/mL (ref 232–1245)

## 2017-01-05 ENCOUNTER — Encounter: Attending: Cardiovascular Disease | Primary: Specialist

## 2017-01-16 ENCOUNTER — Ambulatory Visit
Admit: 2017-01-16 | Discharge: 2017-01-16 | Payer: PRIVATE HEALTH INSURANCE | Attending: Specialist | Primary: Specialist

## 2017-01-16 DIAGNOSIS — J45909 Unspecified asthma, uncomplicated: Secondary | ICD-10-CM

## 2017-01-16 NOTE — Progress Notes (Signed)
[facility]  Campbell Riches, M.D.  Sudhirkumar C. Posey Pronto, M.D.  [place], [place]  Ph No:  [phone]  Fax:  [phone]      Chief Complaint   Patient presents with   ??? Fatigue   ??? Hypotension   ??? Other     just don't feel right   ??? Back Pain       History of Present Illness:  [name] is a 66 y.o. female that presents today for follow-up,    Discussed lab results, mild worsening of agitation, irritability, tolerating 20 mg of fluoxetine, also has symptoms of excessive fatigue, patient concerned about the low blood pressure, however tolerating beta-blocker well, complaining of nonspecific left-sided chest pain, musculoskeletal, with fair amount of reproducible tenderness, reassurance,      History of chronic eosinophilic pneumonia/asthma, stable, tolerating Flovent well,  ??  Stable GAD/depression, on fluoxetine, Xanax, needed refill for fluoxetine,    ??  Osteopenia, patient using calcium and vitamin D on regular basis,  ??  Aortic dissection, stable, tolerating beta-blocker well, denies any chest pain or shortness of breath, tolerating statins well,    Hyperlipidemia, tolerating Lipitor well, no myalgia noted,        Allergies   Allergen Reactions   ??? Aspirin Other (comments)     "Flu-like symptoms"   ??? Cymbalta [Duloxetine] Other (comments)     pain   ??? Elavil Other (comments)     pain   ??? Forteo [Teriparatide] Other (comments)   ??? Lyrica [Pregabalin] Other (comments)     Hallucinations and falling   ??? Prolia [Denosumab] Rash   ??? Topamax [Topiramate] Other (comments)     Slurred speach   ??? Ultram [Tramadol] Drowsiness     Past Medical History:   Diagnosis Date   ??? Aorta disorder (HCC)     TYPE B   ??? Arthritis    ??? Asthma    ??? Chronic pain     Back   ??? Heart disease    ??? Osteoporosis    ??? Rib fractures     2009   ??? Vitamin B 12 deficiency    ??? Vitamin D deficiency      Past Surgical History:   Procedure Laterality Date   ??? HX BUNIONECTOMY      ??? HX TONSIL AND ADENOIDECTOMY       Family History   Problem Relation Age of Onset   ??? Cancer Mother    ??? Cancer Father      Social History     Social History   ??? Marital status: MARRIED     Spouse name: N/A   ??? Number of children: N/A   ??? Years of education: N/A     Occupational History   ??? Not on file.     Social History Main Topics   ??? Smoking status: Never Smoker   ??? Smokeless tobacco: Never Used   ??? Alcohol use No   ??? Drug use: No   ??? Sexual activity: Yes     Partners: Male     Birth control/ protection: None     Other Topics Concern   ??? Not on file     Social History Narrative     Current Outpatient Prescriptions   Medication Sig Dispense Refill   ??? FLOVENT HFA 220 mcg/actuation inhaler Take 220 mcg by inhalation two (2) times a day.  11   ???  ergocalciferol (ERGOCALCIFEROL) 50,000 unit capsule Take 1 Cap by mouth every seven (7) days. 12 Cap 12   ??? FLUoxetine (PROZAC) 20 mg capsule Take 1 Cap by mouth daily. 90 Cap 2   ??? ALPRAZolam (XANAX) 1 mg tablet Take 1 Tab by mouth three (3) times daily as needed for Anxiety or Sleep. Max Daily Amount: 3 mg. Indications: anxiety 60 Tab 5   ??? atorvastatin (LIPITOR) 10 mg tablet Take 1 Tab by mouth daily. 90 Tab 4   ??? cpap machine kit by Does Not Apply route.     ??? cyanocobalamin, vitamin B-12, 1,000 mcg/mL kit 1 mL by Injection route every month. 10 mL 2   ??? metoprolol tartrate (LOPRESSOR) 25 mg tablet Take 1 Tab by mouth three (3) times daily. Hold if BP < 100 or HR <55 (Patient taking differently: Take 25 mg by mouth two (2) times a day. Hold if BP < 100 or HR <55. Three times a day if needed) 270 Tab 4         REVIEW OF SYSTEMS:    GENERAL: negative for - chills,, fever, hot flashes, malaise, night sweats, sleep disturbance, weight gain or weight loss.    EYES: negative for  - blurred vision, double vision, photophobia, pain, discharge and redness.    ENT AND MOUTH: negative for - epistaxis, headaches, hearing change, nasal    discharge, nasal polyps, oral lesions, facial pain,, sneezing, sore throat, tinnitus, vertigo, visual changes or vocal changes.    CARDIOVASCULAR: negative for - chest pain, dyspnea on exertion, pedal edema, irregular heartbeat, loss of consciousness,  orthopnea, palpitations, paroxysmal nocturnal dyspnea, resting shortness of breath.    RESPIRATORY:  negative for - cough wheezing, hemoptysis, orthopnea, pleuritic pain, shortness of breath, sputum changes,  tachypnea     GASTROINTESTINAL: negative for - abdominal pain, appetite loss, blood in stools, change in bowel habits, change in stools, constipation, diarrhea, gas/bloating, heartburn, hematemesis, melena, nausea/vomiting, stool incontinence or swallowing difficulty    GENITOURINARY FEMALE: negative for -change in menstrual cycle, change in urinary stream, dysmenorrhea, dyspareunia, dysuria, genital discharge, genital ulcers, hematuria, incontinence, irregular/heavy menses, pelvic pain, urinary frequency/urgency and decrease in libido     NEUROLOGICAL: negative for - behavioral changes, bowel and bladder control changes, confusion, dizziness, gait disturbance, headaches, sensory changes, motor weakness, seizures, speech problems, tremors, visual changes or weakness    PSYCHIATRIC: negative for -behavioral disorder, concentration difficulties, , depressive symptoms, disorientation, hallucinations, delusionsmemory difficulties, mood swings, obsessive thoughts, physical abuse, sleep disturbances or suicidal ideation.    MUSCULOSKELETAL: Positive symptoms as above    INTEGUMENTARY (BREASTS): negative for - a breast mass, a palpable lump in the breast, breast pain, nipple discharge     ENDOCRINE: negative for polydipsia, polyuria, polydypsia, cold-heat intolerance,  hot flashes,    HEM/LYMPH: negative for anemia, bruising, bleeding, lymph node enlargement,     ALLERGY/IMMUNOLOGIC: negative for allergies to medicine, food, dyes, hepatitis,    PHYSICAL EXAM   General appearance - alert, well appearing, and in no distress    Mental status - alert, oriented to person, place, and time    Eyes - pupils equal and reactive, extraocular eye movements intact    Ears - bilateral TM's and external ear canals normal    Nose - normal and patent, no erythema, discharge or polyps    Neck - supple, no significant adenopathy, carotids upstroke normal bilaterally, no bruits, thyroid exam: thyroid is normal in size without nodules or tenderness    Throat /  Mouth - no erythema, no tonsillar exudate, normal dental hygiene, no ulcers    Chest - clear to auscultation, no wheezes, rales or rhonchi, symmetric air entry    Heart - normal rate and regular rhythm, S1 and S2 normal, no gallops noted, no murmur    Abdomen - soft, nontender, nondistended, no masses or organomegaly    Back exam - full range of motion, no tenderness, palpable spasm or pain.    Neurological - alert, oriented, normal speech, no focal findings or movement disorder noted    Musculoskeletal -tenderness on the left side of the ribs,    Extremities - peripheral pulses normal, no pedal edema, no clubbing or cyanosis    Skin - normal coloration and turgor, no rashes, no suspicious skin lesions noted    Immunization History   Administered Date(s) Administered   ??? Influenza Vaccine 04/10/2015, 04/09/2016   ??? Pneumococcal Polysaccharide (PPSV-23) 07/10/2010, 05/02/2014          LABS:   Results for orders placed or performed in visit on 65/78/46   METABOLIC PANEL, COMPREHENSIVE   Result Value Ref Range    Glucose 93 65 - 99 mg/dL    BUN 12 8 - 27 mg/dL    Creatinine 0.69 0.57 - 1.00 mg/dL    GFR est non-AA 92 >59 mL/min/1.73    GFR est AA 106 >59 mL/min/1.73    BUN/Creatinine ratio 17 12 - 28    Sodium 142 134 - 144 mmol/L    Potassium 4.2 3.5 - 5.2 mmol/L    Chloride 105 96 - 106 mmol/L    CO2 23 18 - 29 mmol/L    Calcium 9.2 8.7 - 10.3 mg/dL    Protein, total 6.2 6.0 - 8.5 g/dL    Albumin 4.2 3.6 - 4.8 g/dL     GLOBULIN, TOTAL 2.0 1.5 - 4.5 g/dL    A-G Ratio 2.1 1.2 - 2.2    Bilirubin, total 0.3 0.0 - 1.2 mg/dL    Alk. phosphatase 93 39 - 117 IU/L    AST (SGOT) 25 0 - 40 IU/L    ALT (SGPT) 18 0 - 32 IU/L   LIPID PANEL   Result Value Ref Range    Cholesterol, total 144 100 - 199 mg/dL    Triglyceride 243 (H) 0 - 149 mg/dL    HDL Cholesterol 37 (L) >39 mg/dL    VLDL, calculated 49 (H) 5 - 40 mg/dL    LDL, calculated 58 0 - 99 mg/dL   VITAMIN B12   Result Value Ref Range    Vitamin B12 256 232 - 1245 pg/mL   TSH 3RD GENERATION   Result Value Ref Range    TSH 3.410 0.450 - 4.500 uIU/mL   AMB POC COMPLETE CBC,AUTOMATED ENTER   Result Value Ref Range    WBC (POC) 6.3 4.5 - 10.5 10^3/ul    LYMPHOCYTES (POC) 45.0 20.5 - 51.1 %    MONOCYTES (POC) 4.2 1.7 - 9.3 %    GRANULOCYTES (POC) 50.8 42.2 - 75.2 %    ABS. LYMPHS (POC) 2.8 1.2 - 3.4 10^3/ul    ABS. MONOS (POC) 0.3 0.1 - 0.6 10^3/ul    ABS. GRANS (POC) 3.2 1.4 - 6.5 10^3/ul    RBC (POC) 4.566 4 - 6 10^6/ul    HGB (POC) 13.4 11 - 18 g/dL    HCT (POC) 40.5 35 - 60 %    MCV (POC) 88.7 80 - 99.9 fL    MCH (  POC) 29.4 27 - 31 pg    MCHC (POC) 33.1 33 - 37 g/dL    RDW (POC) 12.9 11.6 - 13.7 %    PLATELET (POC) 198 150 - 450 10^3/ul    MPV (POC) 9.7 7.8 - 11 fL   AMB POC URINALYSIS DIP STICK AUTO W/O MICRO   Result Value Ref Range    Color (UA POC) Yellow     Clarity (UA POC) Clear     Glucose (UA POC) Negative Negative mg/dL    Bilirubin (UA POC) Negative Negative    Ketones (UA POC) Negative Negative    Specific gravity (UA POC) 1.015 1.001 - 1.035    Blood (UA POC) Negative Negative    pH (UA POC) 5.5 4.6 - 8.0    Protein (UA POC) Negative Negative    Urobilinogen (UA POC) 0.2 mg/dL 0.2 - 1    Nitrites (UA POC) Negative Negative    Leukocyte esterase (UA POC) Negative Negative         ASSESSMENT AND PLAN  Diagnoses and all orders for this visit:    1. Moderate asthma without complication, unspecified whether persistent    2. BMI 31.0-31.9,adult     3. 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 Recurrent major depressive disorder, in full remission ([place])    5. Aortic dissection distal to left subclavian (HCC)    6. Fatigue, unspecified type    7. Vitamin B 12 deficiency    8. Encounter for immunization    9. Encounter for screening mammogram for breast cancer    10. Obstructive sleep apnea        Increase fluoxetine to 40 mg p.o. daily,/Xanax as directed, given refill today,    Continue beta-blocker as directed, fatigue less likely from beta-blocker,  ??  Osteopenia, continue calcium/vitamin D as directed.  ??  Continue Flovent, patient being followed at [facility], for chronic eosinophilic pneumonia, follow-up recommendation as per pulmonology,  ??  Continue B12 supplementation,    Continue Lipitor as directed, labs up-to-date,    Nonspecific rib tenderness, reassurance,      [id number] Theodoro Kos, MD

## 2017-01-24 ENCOUNTER — Encounter: Attending: Cardiovascular Disease | Primary: Specialist

## 2017-01-28 ENCOUNTER — Encounter

## 2017-01-29 MED ORDER — FLUOXETINE 20 MG CAP
20 mg | ORAL_CAPSULE | ORAL | 1 refills | Status: DC
Start: 2017-01-29 — End: 2017-02-13

## 2017-01-29 NOTE — Telephone Encounter (Signed)
Orders Placed This Encounter   ??? FLUoxetine (PROZAC) 20 mg capsule     Sig: TAKE 1 CAPSULE BY MOUTH  DAILY     Dispense:  90 Cap     Refill:  1     Per dr

## 2017-01-30 ENCOUNTER — Encounter: Attending: Specialist | Primary: Specialist

## 2017-02-13 ENCOUNTER — Ambulatory Visit
Admit: 2017-02-13 | Discharge: 2017-02-13 | Payer: PRIVATE HEALTH INSURANCE | Attending: Specialist | Primary: Specialist

## 2017-02-13 DIAGNOSIS — Z0000 Encounter for general adult medical examination without abnormal findings: Secondary | ICD-10-CM

## 2017-02-13 MED ORDER — ALPRAZOLAM 1 MG TAB
1 mg | ORAL_TABLET | Freq: Two times a day (BID) | ORAL | 1 refills | Status: DC
Start: 2017-02-13 — End: 2017-07-12

## 2017-02-13 MED ORDER — SYRINGE WITH NEEDLE 3 ML 25 X 5/8"
3 mL 25 x 5/8" | INJECTION | 2 refills | Status: DC
Start: 2017-02-13 — End: 2017-02-14

## 2017-02-13 MED ORDER — CYANOCOBALAMIN (VIT B-12) 1,000 MCG/ML INJECTION KIT
1000 mcg/mL | PACK | INTRAMUSCULAR | 2 refills | Status: DC
Start: 2017-02-13 — End: 2017-03-19

## 2017-02-13 MED ORDER — FLUOXETINE 20 MG CAP
20 mg | ORAL_CAPSULE | ORAL | 3 refills | Status: DC
Start: 2017-02-13 — End: 2017-11-12

## 2017-02-13 MED ORDER — METOPROLOL TARTRATE 25 MG TAB
25 mg | ORAL_TABLET | Freq: Two times a day (BID) | ORAL | 4 refills | Status: DC
Start: 2017-02-13 — End: 2017-09-11

## 2017-02-13 MED ORDER — ATORVASTATIN 10 MG TAB
10 mg | ORAL_TABLET | Freq: Every day | ORAL | 4 refills | Status: DC
Start: 2017-02-13 — End: 2017-12-17

## 2017-02-13 NOTE — Patient Instructions (Signed)
Medicare Wellness Visit, Female    The best way to live healthy is to have a lifestyle where you eat a well-balanced diet, exercise regularly, limit alcohol use, and quit all forms of tobacco/nicotine, if applicable.     Regular preventive services are another way to keep healthy. Preventive services (vaccines, screening tests, monitoring & exams) can help personalize your care plan, which helps you manage your own care. Screening tests can find health problems at the earliest stages, when they are easiest to treat.     [facility] follows the current, evidence-based guidelines published by the [place] [employer] (USPSTF) when recommending preventive services for our patients. Because we follow these guidelines, sometimes recommendations change over time as research supports it. (For example, mammograms used to be recommended annually. Even though Medicare will still pay for an annual mammogram, the newer guidelines recommend a mammogram every two years for women of average risk.)    Of course, you and your provider may decide to screen more often for some diseases, based on your risk and co-morbidities (chronic disease you are already diagnosed with).     Preventive services for you include:    - Medicare offers their members a free annual wellness visit, which is time for you and your primary care provider to discuss and plan for your preventive service needs. Take advantage of this benefit every year!    -All people over age 64 should receive the recommended pneumonia vaccines. Current USPSTF guidelines recommend a series of two vaccines for the best pneumonia protection.     -All adults should have a yearly flu vaccine and a tetanus vaccine every 10 years. All adults age 37 years should receive a shingles vaccine once in their lifetime.      -A bone mass density test is recommended when a woman turns 65 to screen  for osteoporosis. This test is only recommended once as a screening. Some providers will use this same test as a disease monitoring tool if you already have osteoporosis.    -All adults age 67-70 years who are overweight should have a diabetes screening test once every three years.     -Other screening tests & preventive services for persons with diabetes include: an eye exam to screen for diabetic retinopathy, a kidney function test, a foot exam, and stricter control over your cholesterol.     -Cardiovascular screening for adults with routine risk involves an electrocardiogram (ECG) at intervals determined by the provider.     -Colorectal cancer screenings should be done for adults age 22-75 years with normal risk. There are a number of acceptable methods of screening for this type of cancer. Each test has its own benefits and drawbacks. Discuss with your provider what is most appropriate for you during your annual wellness visit. The different tests include: colonoscopy (considered the best screening method), a fecal occult blood test, a fecal DNA test, and sigmoidoscopy.    -Breast cancer screenings are recommended every other year for women of normal risk age 78-74 years.     -Cervical cancer screenings for women over age 66 are only recommended with certain risk factors.     -All adults born between [place] should be screened once for Hepatitis C.     Here is a list of your current Health Maintenance items (your personalized list of preventive services) with a due date:  Health Maintenance Due   Topic Date Due   ??? Hepatitis C Test  [birth year]   ??? Shingles Vaccine  08/17/2012   ??? Pneumococcal Vaccine (3 of 3 - PCV13) 05/03/2015   ??? Stool testing for trace blood  05/26/2015   ??? Annual Well Visit  09/26/2016   ??? Cervical Cancer Screening  12/22/2016   ??? Flu Vaccine  02/07/2017

## 2017-02-13 NOTE — Progress Notes (Signed)
This is an Initial Medicare Annual Wellness Exam (AWV) (Performed 12 months after IPPE or effective date of Medicare Part B enrollment, Once in a lifetime)    I have reviewed the patient's medical history in detail and updated the computerized patient record.     History     Past Medical History:   Diagnosis Date   ??? Aorta disorder (HCC)     TYPE B   ??? Arthritis    ??? Asthma    ??? Chronic pain     Back   ??? Heart disease    ??? Osteoporosis    ??? Rib fractures     2009   ??? Vitamin B 12 deficiency    ??? Vitamin D deficiency       Past Surgical History:   Procedure Laterality Date   ??? HX BUNIONECTOMY     ??? HX TONSIL AND ADENOIDECTOMY       Current Outpatient Prescriptions   Medication Sig Dispense Refill   ??? PROAIR HFA 90 mcg/actuation inhaler INHALE 2 INHALATIONS INTO THE LUNGS EVERY 6 (SIX) HOURS AS NEEDED.  11   ??? Syringe with Needle, Disp, (BD ECLIPSE LUER-LOK) 3 mL 25 x 5/8" syrg 10 Units by Does Not Apply route every twenty-eight (28) days. 10 Syringe 2   ??? ALPRAZolam (XANAX) 1 mg tablet Take 0.5 Tabs by mouth two (2) times a day. Max Daily Amount: 1 mg. Indications: anxiety 30 Tab 1   ??? atorvastatin (LIPITOR) 10 mg tablet Take 1 Tab by mouth daily. 90 Tab 4   ??? metoprolol tartrate (LOPRESSOR) 25 mg tablet Take 1 Tab by mouth two (2) times a day. Hold if BP < 100 or HR <55 270 Tab 4   ??? FLUoxetine (PROZAC) 20 mg capsule TAKE 1 CAPSULE BY MOUTH  DAILY 90 Cap 3   ??? cyanocobalamin, vitamin B-12, 1,000 mcg/mL kit 1 mL by Injection route every month. 10 Kit 2   ??? FLOVENT HFA 220 mcg/actuation inhaler Take 220 mcg by inhalation two (2) times a day.  11   ??? ergocalciferol (ERGOCALCIFEROL) 50,000 unit capsule Take 1 Cap by mouth every seven (7) days. 12 Cap 12   ??? cpap machine kit by Does Not Apply route.       Allergies   Allergen Reactions   ??? Aspirin Other (comments)     "Flu-like symptoms"   ??? Cymbalta [Duloxetine] Other (comments)     pain   ??? Elavil Other (comments)     pain    ??? Forteo [Teriparatide] Other (comments)   ??? Lyrica [Pregabalin] Other (comments)     Hallucinations and falling   ??? Prolia [Denosumab] Rash   ??? Topamax [Topiramate] Other (comments)     Slurred speach   ??? Ultram [Tramadol] Drowsiness     Family History   Problem Relation Age of Onset   ??? Cancer Mother    ??? Cancer Father      Social History   Substance Use Topics   ??? Smoking status: Never Smoker   ??? Smokeless tobacco: Never Used   ??? Alcohol use No     Patient Active Problem List   Diagnosis Code   ??? 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 Major depressive disorder with single episode, in partial remission (HCC) F32.4   ??? Asthma, moderate J45.909   ??? Osteoporosis M81.0   ??? History of rib fracture Z87.81   ??? Vitamin B 12 deficiency E53.8   ???  Chronic pain syndrome G89.4   ??? Arthralgia M25.50   ??? Chest pain, unspecified R07.9   ??? Essential hypertension, benign I10   ??? 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 Obstructive sleep apnea G47.33       Depression Risk Factor Screening:     PHQ over the last two weeks 07/11/2016   PHQ Not Done -   Little interest or pleasure in doing things Not at all   Feeling down, depressed, irritable, or hopeless Not at all   Total Score PHQ 2 0     Alcohol Risk Factor Screening:   You do not drink alcohol or very rarely.    Functional Ability and Level of Safety:     Hearing Loss  Hearing is good.    Activities of Daily Living  The home contains: no safety equipment.  Patient does total self care    Fall Risk  No flowsheet data found.    Abuse Screen  Patient is not abused    Cognitive Screening   Evaluation of Cognitive Function:  Has your family/caregiver stated any concerns about your memory: no  Normal    Patient Care Team   Patient Care Team:  [id number] Theodoro Kos, MD as PCP - General (Internal Medicine)  Nechama Guard, MD (Cardiology)   Teressa Lower, MD as Physician (Pulmonary Disease)    Assessment/Plan   Education and counseling provided:  Are appropriate based on today's review and evaluation  End-of-Life planning (with patient's consent)  Pneumococcal Vaccine  Influenza Vaccine  Hepatitis B Vaccine  Screening Mammography  Colorectal cancer screening tests  Cardiovascular screening blood test  Bone mass measurement (DEXA)  Screening for glaucoma  Diabetes screening test     General appearance - alert, well appearing, and in no distress    Mental status - alert, oriented to person, place, and time    Eyes - pupils equal and reactive, extraocular eye movements intact    Ears - bilateral TM's and external ear canals normal    Nose - normal and patent, no erythema, discharge or polyps    Neck - supple, no significant adenopathy, carotids upstroke normal bilaterally, no bruits, thyroid exam: thyroid is normal in size without nodules or tenderness    Throat / Mouth - no erythema, no tonsillar exudate, normal dental hygiene, no ulcers    Chest - clear to auscultation, no wheezes, rales or rhonchi, symmetric air entry    Heart - normal rate and regular rhythm, S1 and S2 normal, no gallops noted, no murmur    Abdomen - soft, nontender, nondistended, no masses or organomegaly    Back exam - full range of motion, no tenderness, palpable spasm or pain.    Neurological - alert, oriented, normal speech, no focal findings or movement disorder noted    Musculoskeletal - no joint tenderness, deformity or swelling    Extremities - peripheral pulses normal, no pedal edema, no clubbing or cyanosis    Skin - normal coloration and turgor, no rashes, no suspicious skin lesions noted      Diagnoses and all orders for this visit:    1. Medicare annual wellness visit, initial    2. GAD (generalized anxiety disorder)  -     ALPRAZolam (XANAX) 1 mg tablet; Take 0.5 Tabs by mouth two (2) times a day. Max Daily Amount: 1 mg. Indications: anxiety     3. Pure hypercholesterolemia  -  atorvastatin (LIPITOR) 10 mg tablet; Take 1 Tab by mouth daily.    4. Recurrent major depressive disorder, in full remission (HCC)  -     FLUoxetine (PROZAC) 20 mg capsule; TAKE 1 CAPSULE BY MOUTH  DAILY    5. Vitamin B 12 deficiency  -     Syringe with Needle, Disp, (BD ECLIPSE LUER-LOK) 3 mL 25 x 5/8" syrg; 10 Units by Does Not Apply route every twenty-eight (28) days.  -     cyanocobalamin, vitamin B-12, 1,000 mcg/mL kit; 1 mL by Injection route every month.    6. BMI 31.0-31.9,adult    7. Moderate asthma without complication, unspecified whether persistent    8. Encounter for immunization  -     PNEUMOCOCCAL CONJ VACCINE 13 VALENT IM  -     ADMIN PNEUMOCOCCAL VACCINE    Other orders  -     metoprolol tartrate (LOPRESSOR) 25 mg tablet; Take 1 Tab by mouth two (2) times a day. Hold if BP < 100 or HR <55         Health Maintenance Due   Topic Date Due   ??? Hepatitis C Screening  [birth year]   ??? ZOSTER VACCINE AGE 4>  08/17/2012   ??? Pneumococcal 19-64 Highest Risk (3 of 3 - PCV13) 05/03/2015   ??? FOBT Q 1 YEAR AGE 46-75  05/26/2015   ??? MEDICARE YEARLY EXAM  09/26/2016   ??? PAP AKA CERVICAL CYTOLOGY  12/22/2016   ??? Influenza Age 72 to Adult  02/07/2017       Stable anxiety symptoms, tolerating anxiolytics well, given prescription, no depressive symptoms,

## 2017-02-13 NOTE — Progress Notes (Signed)
Previnar 13 injection was given to patient. Patient tolerated the injection well and had no reactions or complaints.  Informational handout on the Previnar 13 Immunization was given to patient.

## 2017-02-14 ENCOUNTER — Encounter

## 2017-02-14 MED ORDER — SYRINGE WITH NEEDLE 3 ML 25 X 5/8"
3 mL 25 x 5/8" | INJECTION | 2 refills | Status: DC
Start: 2017-02-14 — End: 2017-03-19

## 2017-03-19 ENCOUNTER — Encounter

## 2017-03-19 MED ORDER — SYRINGE WITH NEEDLE 3 ML 25 X 5/8"
3 mL 25 x 5/8" | INJECTION | 2 refills | Status: DC
Start: 2017-03-19 — End: 2018-02-18

## 2017-03-19 MED ORDER — CYANOCOBALAMIN (VIT B-12) 1,000 MCG/ML INJECTION KIT
1000 mcg/mL | PACK | INTRAMUSCULAR | 2 refills | Status: DC
Start: 2017-03-19 — End: 2018-02-18

## 2017-05-17 ENCOUNTER — Encounter: Payer: PRIVATE HEALTH INSURANCE | Attending: Specialist | Primary: Specialist

## 2017-07-12 ENCOUNTER — Encounter: Admit: 2017-07-12 | Discharge: 2017-07-12 | Payer: PRIVATE HEALTH INSURANCE | Primary: Specialist

## 2017-07-12 ENCOUNTER — Ambulatory Visit
Admit: 2017-07-12 | Discharge: 2017-07-12 | Payer: PRIVATE HEALTH INSURANCE | Attending: Specialist | Primary: Specialist

## 2017-07-12 DIAGNOSIS — Z6832 Body mass index (BMI) 32.0-32.9, adult: Secondary | ICD-10-CM

## 2017-07-12 DIAGNOSIS — R0602 Shortness of breath: Secondary | ICD-10-CM

## 2017-07-12 MED ORDER — ALPRAZOLAM 1 MG TAB
1 mg | ORAL_TABLET | Freq: Two times a day (BID) | ORAL | 1 refills | Status: DC
Start: 2017-07-12 — End: 2017-09-11

## 2017-07-12 MED ORDER — CEFDINIR 300 MG CAP
300 mg | ORAL_CAPSULE | Freq: Two times a day (BID) | ORAL | 0 refills | Status: DC
Start: 2017-07-12 — End: 2017-09-11

## 2017-07-12 NOTE — Progress Notes (Signed)
Xanax rx left at the office.  Called script in to the pharmacy.  kg

## 2017-07-12 NOTE — Progress Notes (Signed)
[facility]  Suzanne Pham, M.D.  [facility] Pham, M.D.  Suzanne Pham, Suzanne Pham  Ph No:  [phone]  Fax:  [phone]      Chief Complaint   Patient presents with   ??? Cold Symptoms     Cough, sore throat, congestion    ??? Shoulder Pain     Left shoulder       History of Present Illness:  Suzanne Pham is a 67 y.o. female that presents today for symptoms of left-sided chest pain, left shoulder pain, associate with cough, sore throat, productive sputum, positive malaise, no fever, no chills, patient seems to be in fair amount of pain, patient was referred to [facility] ER for further evaluation for dissection of the aorta, chest x-ray unremarkable except for early consolidation/atelectasis left lower lobe, elevated diaphragm, husband is sick with similar symptoms,    Stable GAD symptoms, needed refill for medication    Chronic eosinophilic pneumonia, follow with pulmonology, stable symptoms at this time, using her CPAP on a regular basis.    Hypertension stable, tolerating Lopressor, tolerating Lipitor well, no myalgia noted,    Allergies   Allergen Reactions   ??? Aspirin Other (comments)     "Flu-like symptoms"   ??? Cymbalta [Duloxetine] Other (comments)     pain   ??? Elavil Other (comments)     pain   ??? Forteo [Teriparatide] Other (comments)   ??? Lyrica [Pregabalin] Other (comments)     Hallucinations and falling   ??? Prolia [Denosumab] Rash   ??? Topamax [Topiramate] Other (comments)     Slurred speach   ??? Ultram [Tramadol] Drowsiness     Past Medical History:   Diagnosis Date   ??? Aorta disorder (HCC)     TYPE B   ??? Arthritis    ??? Asthma    ??? Chronic pain     Back   ??? Heart disease    ??? Osteoporosis    ??? Rib fractures     2009   ??? Vitamin B 12 deficiency    ??? Vitamin D deficiency      Past Surgical History:   Procedure Laterality Date   ??? HX BUNIONECTOMY     ??? HX TONSIL AND ADENOIDECTOMY       Family History   Problem Relation Age of Onset    ??? Cancer Mother    ??? Cancer Father    ??? Cancer Brother      Social History     Socioeconomic History   ??? Marital status: MARRIED     Spouse name: Not on file   ??? Number of children: Not on file   ??? Years of education: Not on file   ??? Highest education level: Not on file   Social Needs   ??? Financial resource strain: Not on file   ??? Food insecurity - worry: Not on file   ??? Food insecurity - inability: Not on file   ??? Transportation needs - medical: Not on file   ??? Transportation needs - non-medical: Not on file   Occupational History   ??? Not on file   Tobacco Use   ??? Smoking status: Never Smoker   ??? Smokeless tobacco: Never Used   Substance and Sexual Activity   ??? Alcohol use: No   ??? Drug use: No   ??? Sexual activity: Yes     Partners: Male     Birth control/protection: None  Other Topics Concern   ??? Not on file   Social History Narrative   ??? Not on file     Current Outpatient Medications   Medication Sig Dispense Refill   ??? ALPRAZolam (XANAX) 1 mg tablet Take 0.5 Tabs by mouth two (2) times a day. Max Daily Amount: 1 mg. 30 Tab 1   ??? cefdinir (OMNICEF) 300 mg capsule Take 1 Cap by mouth two (2) times a day. 14 Cap 0   ??? Syringe with Needle, Disp, (BD ECLIPSE LUER-LOK) 3 mL 25 x 5/8" syrg Inject 77m of B12 every 30 days IM 50 Syringe 2   ??? cyanocobalamin, vitamin B-12, 1,000 mcg/mL kit 1 mL by Injection route every month. 10 Kit 2   ??? PROAIR HFA 90 mcg/actuation inhaler INHALE 2 INHALATIONS INTO THE LUNGS EVERY 6 (SIX) HOURS AS NEEDED.  11   ??? atorvastatin (LIPITOR) 10 mg tablet Take 1 Tab by mouth daily. 90 Tab 4   ??? metoprolol tartrate (LOPRESSOR) 25 mg tablet Take 1 Tab by mouth two (2) times a day. Hold if BP < 100 or HR <55 270 Tab 4   ??? FLUoxetine (PROZAC) 20 mg capsule TAKE 1 CAPSULE BY MOUTH  DAILY 90 Cap 3   ??? FLOVENT HFA 220 mcg/actuation inhaler Take 220 mcg by inhalation two (2) times a day.  11   ??? ergocalciferol (ERGOCALCIFEROL) 50,000 unit capsule Take 1 Cap by mouth every seven (7) days. 12 Cap 12    ??? cpap machine kit by Does Not Apply route.           REVIEW OF SYSTEMS:    GENERAL: negative for - chills, fatigue, fever, hot flashes, malaise, night sweats, sleep disturbance, weight gain or weight loss.    EYES: negative for  - blurred vision, double vision, photophobia, pain, discharge and redness.    ENT AND MOUTH: Positive symptoms as above  CARDIOVASCULAR: negative for - chest pain, dyspnea on exertion, pedal edema, irregular heartbeat, loss of consciousness,  orthopnea, palpitations, paroxysmal nocturnal dyspnea, resting shortness of breath.    RESPIRATORY:  negative for -  hemoptysis, orthopnea, , shortness of breath, sputum changes,  tachypnea , positive left-sided chest pain,    GASTROINTESTINAL: negative for - abdominal pain, appetite loss, blood in stools, change in bowel habits, change in stools, constipation, diarrhea, gas/bloating, heartburn, hematemesis, melena, nausea/vomiting, stool incontinence or swallowing difficulty    GENITOURINARY FEMALE: negative for -change in menstrual cycle, change in urinary stream, dysmenorrhea, dyspareunia, dysuria, genital discharge, genital ulcers, hematuria, incontinence, irregular/heavy menses, pelvic pain, urinary frequency/urgency and decrease in libido     NEUROLOGICAL: negative for - behavioral changes, bowel and bladder control changes, confusion, dizziness, gait disturbance, headaches, sensory changes, motor weakness, seizures, speech problems, tremors, visual changes or weakness    PSYCHIATRIC: negative for - anxiety, behavioral disorder, concentration difficulties, , depressive symptoms, disorientation, hallucinations, delusions, irritability, memory difficulties, mood swings, obsessive thoughts, physical abuse, sleep disturbances or suicidal ideation.    MUSCULOSKELETAL: negative for - gait disturbance, joint pain, joint stiffness, joint swelling, muscle pain, muscular weakness,      INTEGUMENTARY (BREASTS): negative for - a breast mass, a palpable lump in the breast, breast pain, nipple discharge     ENDOCRINE: negative for polydipsia, polyuria, polydypsia, cold-heat intolerance,  hot flashes,    HEM/LYMPH: negative for anemia, bruising, bleeding, lymph node enlargement,     ALLERGY/IMMUNOLOGIC: negative for allergies to medicine, food, dyes, hepatitis,    PHYSICAL EXAM  General appearance -  alert, well appearing, and in no distress    Mental status - alert, oriented to person, place, and time    Eyes - pupils equal and reactive, extraocular eye movements intact    Ears - bilateral TM's and external ear canals normal    Nose -hyperemic nasal mucosa, postnasal drip,    Neck - supple, no significant adenopathy, carotids upstroke normal bilaterally, no bruits, thyroid exam: thyroid is normal in size without nodules or tenderness    Throat / Mouth -hyperemic pharyngeal mucosa,, no tonsillar exudate, normal dental hygiene, no ulcers    Chest - clear to auscultation, no wheezes, rales or rhonchi, symmetric air entry    Heart - normal rate and regular rhythm, S1 and S2 normal, no gallops noted, no murmur    Abdomen - soft, nontender, nondistended, no masses or organomegaly    Back exam - full range of motion, no tenderness, palpable spasm or pain.    Neurological - alert, oriented, normal speech, no focal findings or movement disorder noted    Musculoskeletal - no joint tenderness, deformity or swelling    Extremities - peripheral pulses normal, no pedal edema, no clubbing or cyanosis    Skin - normal coloration and turgor, no rashes, no suspicious skin lesions noted    Immunization History   Administered Date(s) Administered   ??? Hep B Vaccine 10/18/1993   ??? Influenza Vaccine 10/18/2009, 04/09/2014, 03/22/2015, 04/09/2016   ??? Influenza Vaccine PF 06/05/2011, 04/03/2012   ??? Pneumococcal Conjugate (PCV-13) 02/13/2017   ??? Pneumococcal Polysaccharide (PPSV-23) 07/10/2010, 05/02/2014, 10/15/2014    ??? Tdap 10/19/2007, 10/15/2014          LABS:   Results for orders placed or performed in visit on 70/62/37   METABOLIC PANEL, COMPREHENSIVE   Result Value Ref Range    Glucose 93 65 - 99 mg/dL    BUN 12 8 - 27 mg/dL    Creatinine 0.69 0.57 - 1.00 mg/dL    GFR est non-AA 92 >59 mL/min/1.73    GFR est AA 106 >59 mL/min/1.73    BUN/Creatinine ratio 17 12 - 28    Sodium 142 134 - 144 mmol/L    Potassium 4.2 3.5 - 5.2 mmol/L    Chloride 105 96 - 106 mmol/L    CO2 23 18 - 29 mmol/L    Calcium 9.2 8.7 - 10.3 mg/dL    Protein, total 6.2 6.0 - 8.5 g/dL    Albumin 4.2 3.6 - 4.8 g/dL    GLOBULIN, TOTAL 2.0 1.5 - 4.5 g/dL    A-G Ratio 2.1 1.2 - 2.2    Bilirubin, total 0.3 0.0 - 1.2 mg/dL    Alk. phosphatase 93 39 - 117 IU/L    AST (SGOT) 25 0 - 40 IU/L    ALT (SGPT) 18 0 - 32 IU/L   LIPID PANEL   Result Value Ref Range    Cholesterol, total 144 100 - 199 mg/dL    Triglyceride 243 (H) 0 - 149 mg/dL    HDL Cholesterol 37 (L) >39 mg/dL    VLDL, calculated 49 (H) 5 - 40 mg/dL    LDL, calculated 58 0 - 99 mg/dL   VITAMIN B12   Result Value Ref Range    Vitamin B12 256 232 - 1,245 pg/mL   TSH 3RD GENERATION   Result Value Ref Range    TSH 3.410 0.450 - 4.500 uIU/mL   AMB POC COMPLETE CBC,AUTOMATED ENTER   Result Value Ref Range  WBC (POC) 6.3 4.5 - 10.5 10^3/ul    LYMPHOCYTES (POC) 45.0 20.5 - 51.1 %    MONOCYTES (POC) 4.2 1.7 - 9.3 %    GRANULOCYTES (POC) 50.8 42.2 - 75.2 %    ABS. LYMPHS (POC) 2.8 1.2 - 3.4 10^3/ul    ABS. MONOS (POC) 0.3 0.1 - 0.6 10^3/ul    ABS. GRANS (POC) 3.2 1.4 - 6.5 10^3/ul    RBC (POC) 4.566 4 - 6 10^6/ul    HGB (POC) 13.4 11 - 18 g/dL    HCT (POC) 40.5 35 - 60 %    MCV (POC) 88.7 80 - 99.9 fL    MCH (POC) 29.4 27 - 31 pg    MCHC (POC) 33.1 33 - 37 g/dL    RDW (POC) 12.9 11.6 - 13.7 %    PLATELET (POC) 198 150 - 450 10^3/ul    MPV (POC) 9.7 7.8 - 11 fL   AMB POC URINALYSIS DIP STICK AUTO W/O MICRO   Result Value Ref Range    Color (UA POC) Yellow     Clarity (UA POC) Clear      Glucose (UA POC) Negative Negative mg/dL    Bilirubin (UA POC) Negative Negative    Ketones (UA POC) Negative Negative    Specific gravity (UA POC) 1.015 1.001 - 1.035    Blood (UA POC) Negative Negative    pH (UA POC) 5.5 4.6 - 8.0    Protein (UA POC) Negative Negative    Urobilinogen (UA POC) 0.2 mg/dL 0.2 - 1    Nitrites (UA POC) Negative Negative    Leukocyte esterase (UA POC) Negative Negative         ASSESSMENT AND PLAN  Diagnoses and all orders for this visit:    1. BMI 32.0-32.9,adult    2. GAD (generalized anxiety disorder)  -     ALPRAZolam (XANAX) 1 mg tablet; Take 0.5 Tabs by mouth two (2) times a day. Max Daily Amount: 1 mg.    3. URI, acute  -     cefdinir (OMNICEF) 300 mg capsule; Take 1 Cap by mouth two (2) times a day.    4. Recurrent major depressive disorder, in full remission ([place])    5. Moderate asthma without complication, unspecified whether persistent    6. 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 SOB (shortness of breath)  -     XR CHEST PA LAT; Future    8. Left sided chest pain  -     XR CHEST PA LAT; Future        Patient was sent to [facility] ER, given p.o. Omnicef use as directed, patient was sent to evaluate for dissection of the aorta,    Stable chronic eosinophilic pneumonia, continue steroid maintenance inhaler as above,    Given refill for Xanax, stable GAD symptoms.  Next    Continue Lopressor/Lipitor as directed.  Advised the patient to call me if any worsening of symptoms.      ZOXWRUEA Theodoro Kos, MD

## 2017-07-27 ENCOUNTER — Encounter

## 2017-07-27 MED ORDER — ERGOCALCIFEROL (VITAMIN D2) 50,000 UNIT CAP
1250 mcg (50,000 unit) | ORAL_CAPSULE | ORAL | 12 refills | Status: DC
Start: 2017-07-27 — End: 2018-10-07

## 2017-07-27 NOTE — Telephone Encounter (Signed)
Orders Placed This Encounter   ??? ergocalciferol (ERGOCALCIFEROL) 50,000 unit capsule     Sig: Take 1 Cap by mouth every seven (7) days.     Dispense:  12 Cap     Refill:  12     Per [place]

## 2017-08-01 ENCOUNTER — Encounter: Attending: Cardiovascular Disease | Primary: Specialist

## 2017-09-11 ENCOUNTER — Ambulatory Visit: Admit: 2017-09-11 | Discharge: 2017-09-11 | Payer: MEDICARE | Attending: Specialist | Primary: Specialist

## 2017-09-11 DIAGNOSIS — Z6832 Body mass index (BMI) 32.0-32.9, adult: Secondary | ICD-10-CM

## 2017-09-11 LAB — AMB POC RAPID INFLUENZA TEST
Influenza A Ag POC: NEGATIVE Pos/Neg
Influenza B Ag POC: NEGATIVE Pos/Neg

## 2017-09-11 LAB — AMB POC RAPID STREP A: Group A Strep Ag: NEGATIVE

## 2017-09-11 MED ORDER — METHYLPREDNISOLONE 4 MG TABS IN A DOSE PACK
4 mg | ORAL | 0 refills | Status: DC
Start: 2017-09-11 — End: 2017-11-12

## 2017-09-11 MED ORDER — CEFDINIR 300 MG CAP
300 mg | ORAL_CAPSULE | Freq: Two times a day (BID) | ORAL | 0 refills | Status: DC
Start: 2017-09-11 — End: 2017-11-12

## 2017-09-11 MED ORDER — ALPRAZOLAM 1 MG TAB
1 mg | ORAL_TABLET | Freq: Two times a day (BID) | ORAL | 1 refills | Status: DC
Start: 2017-09-11 — End: 2017-11-12

## 2017-09-11 NOTE — Progress Notes (Signed)
[facility]  Campbell Riches, M.D.  Sudhirkumar C. Posey Pronto, M.D.  [place], [place]  Ph No:  [phone]  Fax:  [phone]      Chief Complaint   Patient presents with   ??? Nasal Discharge   ??? Headache   ??? Sore Throat   ??? Cough       History of Present Illness:  Suzanne Pham is a 67 y.o. female that presents today for symptoms of cough, wheezing, productive sputum, for the past few days, associated malaise, fatigue, denies any chills, positive for nasal discharge, sore throat, negative for flu/strep,    Chronic eosinophilic pneumonia, seen pulmonary recently, stable symptoms, patient on Flovent without any problem,    Anemia, patient had normal iron/ferritin done recently at the pulmonologist office, patient on B12 supplementation, stable hemoglobin,    Stable hypertension, history of aortic dissection, stable symptoms, doing fairly well,     Stable GAD symptoms, tolerating medications well, needed prescription for Xanax,    Stable GAD/depressive symptoms on fluoxetine,    Allergies   Allergen Reactions   ??? Aspirin Other (comments)     "Flu-like symptoms"   ??? Cymbalta [Duloxetine] Other (comments)     pain   ??? Elavil Other (comments)     pain   ??? Forteo [Teriparatide] Other (comments)   ??? Lyrica [Pregabalin] Other (comments)     Hallucinations and falling   ??? Prolia [Denosumab] Rash   ??? Topamax [Topiramate] Other (comments)     Slurred speach   ??? Ultram [Tramadol] Drowsiness     Past Medical History:   Diagnosis Date   ??? Aorta disorder (HCC)     TYPE B   ??? Arthritis    ??? Asthma    ??? Chronic pain     Back   ??? Heart disease    ??? Osteoporosis    ??? Rib fractures     2009   ??? Vitamin B 12 deficiency    ??? Vitamin D deficiency      Past Surgical History:   Procedure Laterality Date   ??? HX BUNIONECTOMY     ??? HX TONSIL AND ADENOIDECTOMY       Family History   Problem Relation Age of Onset   ??? Cancer Mother    ??? Cancer Father    ??? Cancer Brother       Social History     Socioeconomic History   ??? Marital status: MARRIED     Spouse name: Not on file   ??? Number of children: Not on file   ??? Years of education: Not on file   ??? Highest education level: Not on file   Social Needs   ??? Financial resource strain: Not on file   ??? Food insecurity - worry: Not on file   ??? Food insecurity - inability: Not on file   ??? Transportation needs - medical: Not on file   ??? Transportation needs - non-medical: Not on file   Occupational History   ??? Not on file   Tobacco Use   ??? Smoking status: Never Smoker   ??? Smokeless tobacco: Never Used   Substance and Sexual Activity   ??? Alcohol use: No   ??? Drug use: No   ??? Sexual activity: Yes     Partners: Male     Birth control/protection: None   Other Topics Concern   ??? Not on file   Social  History Narrative   ??? Not on file     Current Outpatient Medications   Medication Sig Dispense Refill   ??? metoprolol succinate (TOPROL XL) 50 mg XL tablet Take  by mouth daily.     ??? ALPRAZolam (XANAX) 1 mg tablet Take 0.5 Tabs by mouth two (2) times a day. Max Daily Amount: 1 mg. 30 Tab 1   ??? cefdinir (OMNICEF) 300 mg capsule Take 1 Cap by mouth two (2) times a day. 14 Cap 0   ??? methylPREDNISolone (MEDROL DOSEPACK) 4 mg tablet As directed 1 Dose Pack 0   ??? ergocalciferol (ERGOCALCIFEROL) 50,000 unit capsule Take 1 Cap by mouth every seven (7) days. 12 Cap 12   ??? Syringe with Needle, Disp, (BD ECLIPSE LUER-LOK) 3 mL 25 x 5/8" syrg Inject 54m of B12 every 30 days IM 50 Syringe 2   ??? cyanocobalamin, vitamin B-12, 1,000 mcg/mL kit 1 mL by Injection route every month. 10 Kit 2   ??? PROAIR HFA 90 mcg/actuation inhaler INHALE 2 INHALATIONS INTO THE LUNGS EVERY 6 (SIX) HOURS AS NEEDED.  11   ??? atorvastatin (LIPITOR) 10 mg tablet Take 1 Tab by mouth daily. 90 Tab 4   ??? FLUoxetine (PROZAC) 20 mg capsule TAKE 1 CAPSULE BY MOUTH  DAILY 90 Cap 3   ??? FLOVENT HFA 220 mcg/actuation inhaler Take 220 mcg by inhalation two (2) times a day.  11    ??? cpap machine kit by Does Not Apply route.           REVIEW OF SYSTEMS:    GENERAL: negative for - chills, , fever, hot flashes,night sweats, sleep disturbance, weight gain or weight loss.    EYES: negative for  - blurred vision, double vision, photophobia, pain, discharge and redness.    ENT AND MOUTH: Positive symptoms as above  CARDIOVASCULAR: negative for - chest pain, dyspnea on exertion, pedal edema, irregular heartbeat, loss of consciousness,  orthopnea, palpitations, paroxysmal nocturnal dyspnea, resting shortness of breath.    RESPIRATORY:  n positive symptoms as above    GASTROINTESTINAL: negative for - abdominal pain, appetite loss, blood in stools, change in bowel habits, change in stools, constipation, diarrhea, gas/bloating, heartburn, hematemesis, melena, nausea/vomiting, stool incontinence or swallowing difficulty    GENITOURINARY FEMALE: negative for -change in menstrual cycle, change in urinary stream, dysmenorrhea, dyspareunia, dysuria, genital discharge, genital ulcers, hematuria, incontinence, irregular/heavy menses, pelvic pain, urinary frequency/urgency and decrease in libido     NEUROLOGICAL: negative for - behavioral changes, bowel and bladder control changes, confusion, dizziness, gait disturbance, headaches, sensory changes, motor weakness, seizures, speech problems, tremors, visual changes or weakness    PSYCHIATRIC: negative for - anxiety, behavioral disorder, concentration difficulties, , depressive symptoms, disorientation, hallucinations, delusions, irritability, memory difficulties, mood swings, obsessive thoughts, physical abuse, sleep disturbances or suicidal ideation.    MUSCULOSKELETAL: negative for - gait disturbance, joint pain, joint stiffness, joint swelling, muscle pain, muscular weakness,     INTEGUMENTARY (BREASTS): negative for - a breast mass, a palpable lump in the breast, breast pain, nipple discharge      ENDOCRINE: negative for polydipsia, polyuria, polydypsia, cold-heat intolerance,  hot flashes,    HEM/LYMPH: negative for anemia, bruising, bleeding, lymph node enlargement,     ALLERGY/IMMUNOLOGIC: negative for allergies to medicine, food, dyes, hepatitis,    PHYSICAL EXAM  General appearance - alert, well appearing, and in no distress    Mental status - alert, oriented to person, place, and time    Eyes -  pupils equal and reactive, extraocular eye movements intact    Ears - bilateral TM's and external ear canals normal    Nose -hyperemic nasal mucosa, postnasal drip, s    Neck - supple, no significant adenopathy, carotids upstroke normal bilaterally, no bruits, thyroid exam: thyroid is normal in size without nodules or tenderness    Throat / Mouth - no erythema, no tonsillar exudate, normal dental hygiene, no ulcers    Chest -bilateral scattered wheezing,    Heart - normal rate and regular rhythm, S1 and S2 normal, no gallops noted, no murmur    Abdomen - soft, nontender, nondistended, no masses or organomegaly    Back exam - full range of motion, no tenderness, palpable spasm or pain.    Neurological - alert, oriented, normal speech, no focal findings or movement disorder noted    Musculoskeletal - no joint tenderness, deformity or swelling    Extremities - peripheral pulses normal, no pedal edema, no clubbing or cyanosis    Skin - normal coloration and turgor, no rashes, no suspicious skin lesions noted    Immunization History   Administered Date(s) Administered   ??? Hep B Vaccine 10/18/1993   ??? Influenza Vaccine 10/18/2009, 04/09/2014, 03/22/2015, 04/09/2016   ??? Influenza Vaccine (Quad) PF 04/09/2017   ??? Influenza Vaccine PF 06/05/2011, 04/03/2012   ??? Pneumococcal Conjugate (PCV-13) 02/13/2017   ??? Pneumococcal Polysaccharide (PPSV-23) 07/10/2010, 05/02/2014, 10/15/2014   ??? Tdap 10/19/2007, 10/15/2014          LABS:   Results for orders placed or performed in visit on 09/11/17   AMB POC RAPID INFLUENZA TEST    Result Value Ref Range    VALID INTERNAL CONTROL POC Yes     Influenza A Ag POC Negative Negative Pos/Neg    Influenza B Ag POC Negative Negative Pos/Neg   AMB POC RAPID STREP A   Result Value Ref Range    VALID INTERNAL CONTROL POC Yes     Group A Strep Ag Negative Negative         ASSESSMENT AND PLAN  Diagnoses and all orders for this visit:    1. BMI 32.0-32.9,adult    2. GAD (generalized anxiety disorder)  -     ALPRAZolam (XANAX) 1 mg tablet; Take 0.5 Tabs by mouth two (2) times a day. Max Daily Amount: 1 mg.    3. Cough  -     AMB POC RAPID INFLUENZA TEST  -     AMB POC RAPID STREP A    4. Malaise and fatigue  -     AMB POC RAPID INFLUENZA TEST  -     AMB POC RAPID STREP A    5. Vitamin D deficiency    6. Chronic eosinophilic pneumonia ([place])    7. Vitamin B 12 deficiency    8. Obstructive sleep apnea    9. Acute bronchitis, bacterial  -     cefdinir (OMNICEF) 300 mg capsule; Take 1 Cap by mouth two (2) times a day.  -     methylPREDNISolone (MEDROL DOSEPACK) 4 mg tablet; As directed        Omnicef as directed, Medrol Dosepak, continue maintenance/rescue inhalers as directed,    Stable hypertension continue present medication as directed.      Continue fluoxetine , Xanax, given prescription/refill today,     Normal iron/ferritin, continue B12 supplementation as directed.  Advised the patient to call me if any worsening symptoms.      Lemoyne Scarpati Jerilynn Mages  Narada Uzzle, MD

## 2017-09-19 ENCOUNTER — Encounter: Attending: Specialist | Primary: Specialist

## 2017-11-12 ENCOUNTER — Ambulatory Visit: Admit: 2017-11-12 | Discharge: 2017-11-12 | Payer: MEDICARE | Attending: Specialist | Primary: Specialist

## 2017-11-12 DIAGNOSIS — Z6832 Body mass index (BMI) 32.0-32.9, adult: Secondary | ICD-10-CM

## 2017-11-12 MED ORDER — ALPRAZOLAM 1 MG TAB
1 mg | ORAL_TABLET | Freq: Two times a day (BID) | ORAL | 3 refills | Status: DC
Start: 2017-11-12 — End: 2018-03-26

## 2017-11-12 MED ORDER — FLUOXETINE 40 MG CAP
40 mg | ORAL_CAPSULE | Freq: Every day | ORAL | 2 refills | Status: DC
Start: 2017-11-12 — End: 2018-10-11

## 2017-11-12 NOTE — ACP (Advance Care Planning) (Signed)
Advanced Care Planning was discussed with patient.  YES   Patient states that they have a Living Will.  YES   Patient advised to bring a copy of Living Will for Medical Records.  YES   Patient advised on the advantages of having a Living Will and having a copy in their Medical Records.  YES   Patient provided with Advanced Care Planning information.  NO

## 2017-11-12 NOTE — Progress Notes (Signed)
[facility]  Campbell Pham, M.D.  Suzanne Pham, M.D.  [place], [place]  Ph No:  [phone]  Fax:  [phone]      Chief Complaint   Patient presents with   ??? Follow Up Chronic Condition   ??? Labs     pt req labs       History of Present Illness:  Suzanne Pham is a 67 y.o. female that presents today for follow-up, persistent anxiety symptoms, on fluoxetine 20 mg p.o. daily, needed refill for her anxiolytics, positive anhedonia.    History of aortic dissection, stable symptoms, blood pressure well controlled, denies any chest pain or shortness of breath.    Patient on vitamin D replacement.    Postmenopausal, discussed preventive screening/immunization,    Allergies   Allergen Reactions   ??? Aspirin Other (comments)     "Flu-like symptoms"   ??? Cymbalta [Duloxetine] Other (comments)     pain   ??? Elavil Other (comments)     pain   ??? Forteo [Teriparatide] Other (comments)   ??? Lyrica [Pregabalin] Other (comments)     Hallucinations and falling   ??? Prolia [Denosumab] Rash   ??? Topamax [Topiramate] Other (comments)     Slurred speach   ??? Ultram [Tramadol] Drowsiness     Past Medical History:   Diagnosis Date   ??? Aorta disorder (HCC)     TYPE B   ??? Arthritis    ??? Asthma    ??? Chronic pain     Back   ??? Heart disease    ??? Osteoporosis    ??? Rib fractures     2009   ??? Vitamin B 12 deficiency    ??? Vitamin D deficiency      Past Surgical History:   Procedure Laterality Date   ??? HX BUNIONECTOMY     ??? HX TONSIL AND ADENOIDECTOMY       Family History   Problem Relation Age of Onset   ??? Cancer Mother    ??? Cancer Father    ??? Cancer Brother      Social History     Socioeconomic History   ??? Marital status: MARRIED     Spouse name: Not on file   ??? Number of children: Not on file   ??? Years of education: Not on file   ??? Highest education level: Not on file   Occupational History   ??? Not on file   Social Needs   ??? Financial resource strain: Not on file    ??? Food insecurity:     Worry: Not on file     Inability: Not on file   ??? Transportation needs:     Medical: Not on file     Non-medical: Not on file   Tobacco Use   ??? Smoking status: Never Smoker   ??? Smokeless tobacco: Never Used   Substance and Sexual Activity   ??? Alcohol use: No   ??? Drug use: No   ??? Sexual activity: Yes     Partners: Male     Birth control/protection: None   Lifestyle   ??? Physical activity:     Days per week: Not on file     Minutes per session: Not on file   ??? Stress: Not on file   Relationships   ??? Social connections:     Talks on phone: Not on file     Gets  together: Not on file     Attends religious service: Not on file     Active member of club or organization: Not on file     Attends meetings of clubs or organizations: Not on file     Relationship status: Not on file   ??? Intimate partner violence:     Fear of current or ex partner: Not on file     Emotionally abused: Not on file     Physically abused: Not on file     Forced sexual activity: Not on file   Other Topics Concern   ??? Not on file   Social History Narrative   ??? Not on file     Current Outpatient Medications   Medication Sig Dispense Refill   ??? ALPRAZolam (XANAX) 1 mg tablet Take 0.5 Tabs by mouth two (2) times a day. Max Daily Amount: 1 mg. Indications: anxious 30 Tab 3   ??? FLUoxetine (PROZAC) 40 mg capsule Take 1 Cap by mouth daily. 90 Cap 2   ??? metoprolol succinate (TOPROL XL) 50 mg XL tablet Take 50 mg by mouth daily.     ??? ergocalciferol (ERGOCALCIFEROL) 50,000 unit capsule Take 1 Cap by mouth every seven (7) days. 12 Cap 12   ??? Syringe with Needle, Disp, (BD ECLIPSE LUER-LOK) 3 mL 25 x 5/8" syrg Inject 25m of B12 every 30 days IM 50 Syringe 2   ??? cyanocobalamin, vitamin B-12, 1,000 mcg/mL kit 1 mL by Injection route every month. 10 Kit 2   ??? PROAIR HFA 90 mcg/actuation inhaler INHALE 2 INHALATIONS INTO THE LUNGS EVERY 6 (SIX) HOURS AS NEEDED.  11   ??? atorvastatin (LIPITOR) 10 mg tablet Take 1 Tab by mouth daily. 90 Tab 4    ??? FLOVENT HFA 220 mcg/actuation inhaler Take 220 mcg by inhalation two (2) times a day.  11   ??? cpap machine kit by Does Not Apply route.           REVIEW OF SYSTEMS:    GENERAL: negative for - chills, fatigue, fever, hot flashes, malaise, night sweats, sleep disturbance, weight gain or weight loss.    EYES: negative for  - blurred vision, double vision, photophobia, pain, discharge and redness.    ENT AND MOUTH: negative for - epistaxis, headaches, hearing change, nasal   discharge, nasal polyps, oral lesions, facial pain,, sneezing, sore throat, tinnitus, vertigo, visual changes or vocal changes.    CARDIOVASCULAR: negative for - chest pain, dyspnea on exertion, pedal edema, irregular heartbeat, loss of consciousness,  orthopnea, palpitations, paroxysmal nocturnal dyspnea, resting shortness of breath.    RESPIRATORY:  negative for - cough wheezing, hemoptysis, orthopnea, pleuritic pain, shortness of breath, sputum changes,  tachypnea     GASTROINTESTINAL: negative for - abdominal pain, appetite loss, blood in stools, change in bowel habits, change in stools, constipation, diarrhea, gas/bloating, heartburn, hematemesis, melena, nausea/vomiting, stool incontinence or swallowing difficulty    GENITOURINARY FEMALE: negative for -change in menstrual cycle, change in urinary stream, dysmenorrhea, dyspareunia, dysuria, genital discharge, genital ulcers, hematuria, incontinence, irregular/heavy menses, pelvic pain, urinary frequency/urgency and decrease in libido     NEUROLOGICAL: negative for - behavioral changes, bowel and bladder control changes, confusion, dizziness, gait disturbance, headaches, sensory changes, motor weakness, seizures, speech problems, tremors, visual changes or weakness    PSYCHIATRIC: negative for - anxiety, behavioral disorder, concentration difficulties, , depressive symptoms, disorientation, hallucinations, delusions, irritability, memory difficulties, mood swings, obsessive  thoughts, physical abuse, sleep disturbances or suicidal ideation.  MUSCULOSKELETAL: negative for - gait disturbance, joint pain, joint stiffness, joint swelling, muscle pain, muscular weakness,     INTEGUMENTARY (BREASTS): negative for - a breast mass, a palpable lump in the breast, breast pain, nipple discharge     ENDOCRINE: negative for polydipsia, polyuria, polydypsia, cold-heat intolerance,  hot flashes,    HEM/LYMPH: negative for anemia, bruising, bleeding, lymph node enlargement,     ALLERGY/IMMUNOLOGIC: negative for allergies to medicine, food, dyes, hepatitis,    PHYSICAL EXAM  General appearance - alert, well appearing, and in no distress    Mental status - alert, oriented to person, place, and time    Eyes - pupils equal and reactive, extraocular eye movements intact    Ears - bilateral TM's and external ear canals normal    Nose - normal and patent, no erythema, discharge or polyps    Neck - supple, no significant adenopathy, carotids upstroke normal bilaterally, no bruits, thyroid exam: thyroid is normal in size without nodules or tenderness    Throat / Mouth - no erythema, no tonsillar exudate, normal dental hygiene, no ulcers    Chest - clear to auscultation, no wheezes, rales or rhonchi, symmetric air entry    Heart - normal rate and regular rhythm, S1 and S2 normal, no gallops noted, no murmur    Abdomen - soft, nontender, nondistended, no masses or organomegaly    Back exam - full range of motion, no tenderness, palpable spasm or pain.    Neurological - alert, oriented, normal speech, no focal findings or movement disorder noted    Musculoskeletal - no joint tenderness, deformity or swelling    Extremities - peripheral pulses normal, no pedal edema, no clubbing or cyanosis    Skin - normal coloration and turgor, no rashes, no suspicious skin lesions noted    Immunization History   Administered Date(s) Administered   ??? Hep B Vaccine 10/18/1993    ??? Influenza Vaccine 10/18/2009, 04/09/2014, 03/22/2015, 04/09/2016   ??? Influenza Vaccine (Quad) PF 04/09/2017   ??? Influenza Vaccine PF 06/05/2011, 04/03/2012   ??? Pneumococcal Conjugate (PCV-13) 02/13/2017   ??? Pneumococcal Polysaccharide (PPSV-23) 07/10/2010, 05/02/2014, 10/15/2014   ??? Tdap 10/19/2007, 10/15/2014          LABS:   Results for orders placed or performed in visit on 11/12/17   LIPID PANEL   Result Value Ref Range    Cholesterol, total 130 100 - 199 mg/dL    Triglyceride 156 (H) 0 - 149 mg/dL    HDL Cholesterol 44 >39 mg/dL    VLDL, calculated 31 5 - 40 mg/dL    LDL, calculated 55 0 - 99 mg/dL   METABOLIC PANEL, COMPREHENSIVE   Result Value Ref Range    Glucose 84 65 - 99 mg/dL    BUN 16 8 - 27 mg/dL    Creatinine 0.76 0.57 - 1.00 mg/dL    GFR est non-AA 83 >59 mL/min/1.73    GFR est AA 95 >59 mL/min/1.73    BUN/Creatinine ratio 21 12 - 28    Sodium 142 134 - 144 mmol/L    Potassium 4.2 3.5 - 5.2 mmol/L    Chloride 107 (H) 96 - 106 mmol/L    CO2 24 20 - 29 mmol/L    Calcium 9.6 8.7 - 10.3 mg/dL    Protein, total 5.9 (L) 6.0 - 8.5 g/dL    Albumin 4.0 3.6 - 4.8 g/dL    GLOBULIN, TOTAL 1.9 1.5 - 4.5 g/dL    A-G Ratio  2.1 1.2 - 2.2    Bilirubin, total 0.4 0.0 - 1.2 mg/dL    Alk. phosphatase 79 39 - 117 IU/L    AST (SGOT) 14 0 - 40 IU/L    ALT (SGPT) 15 0 - 32 IU/L   TSH 3RD GENERATION   Result Value Ref Range    TSH 2.070 0.450 - 4.500 uIU/mL   VITAMIN D, 25 HYDROXY   Result Value Ref Range    VITAMIN D, 25-HYDROXY 48.5 30.0 - 100.0 ng/mL   HEPATITIS C AB   Result Value Ref Range    Hep C Virus Ab <0.1 0.0 - 0.9 s/co ratio   AMB POC COMPLETE CBC,AUTOMATED ENTER   Result Value Ref Range    WBC (POC) 6.8 4.5 - 10.5 10^3/ul    LYMPHOCYTES (POC) 37.0 20.5 - 51.1 %    MONOCYTES (POC) 5.4 1.7 - 9.3 %    GRANULOCYTES (POC) 57.6 42.2 - 75.2 %    ABS. LYMPHS (POC) 2.5 1.2 - 3.4 10^3/ul    ABS. MONOS (POC) 0.4 0.1 - 0.6 10^3/ul    ABS. GRANS (POC) 3.9 1.4 - 6.5 10^3/ul    RBC (POC) 4.40 4 - 6 10^6/ul     HGB (POC) 13.4 11 - 18 g/dL    HCT (POC) 39.7 35 - 60 %    MCV (POC) 90.3 80 - 99.9 fL    MCH (POC) 30.6 27 - 31 pg    MCHC (POC) 33.9 33 - 37 g/dL    RDW (POC) 13.8 (A) 11.6 - 13.7 %    PLATELET (POC) 228 150 - 450 10^3/ul    MPV (POC) 9.4 7.8 - 11 fL   AMB POC URINALYSIS DIP STICK AUTO W/O MICRO   Result Value Ref Range    Color (UA POC) Yellow     Clarity (UA POC) Clear     Glucose (UA POC) Negative Negative mg/dL    Bilirubin (UA POC) Negative Negative    Ketones (UA POC) Negative Negative    Specific gravity (UA POC) 1.015 1.001 - 1.035    Blood (UA POC) Negative Negative    pH (UA POC) 5.5 4.6 - 8.0    Protein (UA POC) Negative Negative    Urobilinogen (UA POC) 0.2 mg/dL 0.2 - 1    Nitrites (UA POC) Negative Negative    Leukocyte esterase (UA POC) Negative Negative         ASSESSMENT AND PLAN  Diagnoses and all orders for this visit:    1. BMI 32.0-32.9,adult    2. GAD (generalized anxiety disorder)  -     ALPRAZolam (XANAX) 1 mg tablet; Take 0.5 Tabs by mouth two (2) times a day. Max Daily Amount: 1 mg. Indications: anxious  -     FLUoxetine (PROZAC) 40 mg capsule; Take 1 Cap by mouth daily.    3. Vitamin D deficiency  -     VITAMIN D, 25 HYDROXY    4. Vitamin B 12 deficiency    5. 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 Obstructive sleep apnea  -     ALPRAZolam (XANAX) 1 mg tablet; Take 0.5 Tabs by mouth two (2) times a day. Max Daily Amount: 1 mg. Indications: anxious  -     AMB POC COMPLETE CBC,AUTOMATED ENTER  -     COLLECTION VENOUS BLOOD,VENIPUNCTURE  -     AMB POC URINALYSIS DIP STICK AUTO W/O MICRO  -     LIPID PANEL  -  METABOLIC PANEL, COMPREHENSIVE  -     TSH 3RD GENERATION    7. Aortic dissection distal to left subclavian (HCC)  -     ALPRAZolam (XANAX) 1 mg tablet; Take 0.5 Tabs by mouth two (2) times a day. Max Daily Amount: 1 mg. Indications: anxious  -     AMB POC COMPLETE CBC,AUTOMATED ENTER  -     COLLECTION VENOUS BLOOD,VENIPUNCTURE  -     AMB POC URINALYSIS DIP STICK AUTO W/O MICRO   -     LIPID PANEL  -     METABOLIC PANEL, COMPREHENSIVE  -     TSH 3RD GENERATION    8. Encounter for hepatitis C virus screening test for high risk patient  -     HEPATITIS C AB    9. Mild depression (HCC)  -     FLUoxetine (PROZAC) 40 mg capsule; Take 1 Cap by mouth daily.        Increase fluoxetine to 40 mg p.o. daily, Xanax as directed,    Check hep C antibodies,    Continue Toprol-XL/statins as above.  Check basic labs,    Stable allergic rhinitis/asthma symptoms, continue Flovent/rescue inhalers as directed.      [facility] Theodoro Kos, Suzanne Pham

## 2017-11-12 NOTE — Addendum Note (Signed)
Addended by: Brien Few on: 11/19/2017 09:42 AM     Modules accepted: Orders

## 2017-11-13 LAB — AMB POC URINALYSIS DIP STICK AUTO W/O MICRO
Bilirubin (UA POC): NEGATIVE
Blood (UA POC): NEGATIVE
Glucose (UA POC): NEGATIVE mg/dL
Ketones (UA POC): NEGATIVE
Leukocyte esterase (UA POC): NEGATIVE
Nitrites (UA POC): NEGATIVE
Protein (UA POC): NEGATIVE
Specific gravity (UA POC): 1.015 (ref 1.001–1.035)
Urobilinogen (UA POC): 0.2 (ref 0.2–1)
pH (UA POC): 5.5 (ref 4.6–8.0)

## 2017-11-13 LAB — AMB POC COMPLETE CBC,AUTOMATED ENTER
ABS. GRANS (POC): 3.9 10*3/uL (ref 1.4–6.5)
ABS. LYMPHS (POC): 2.5 10*3/uL (ref 1.2–3.4)
ABS. MONOS (POC): 0.4 10*3/uL (ref 0.1–0.6)
GRANULOCYTES (POC): 57.6 % (ref 42.2–75.2)
HCT (POC): 39.7 % (ref 35–60)
HGB (POC): 13.4 g/dL (ref 11–18)
LYMPHOCYTES (POC): 37 % (ref 20.5–51.1)
MCH (POC): 30.6 pg (ref 27–31)
MCHC (POC): 33.9 g/dL (ref 33–37)
MCV (POC): 90.3 fL (ref 80–99.9)
MONOCYTES (POC): 5.4 % (ref 1.7–9.3)
MPV (POC): 9.4 fL (ref 7.8–11)
PLATELET (POC): 228 10*3/uL (ref 150–450)
RBC (POC): 4.4 10*6/uL (ref 4–6)
RDW (POC): 13.8 % — AB (ref 11.6–13.7)
WBC (POC): 6.8 10*3/uL (ref 4.5–10.5)

## 2017-11-13 LAB — LIPID PANEL
Cholesterol, total: 130 mg/dL (ref 100–199)
HDL Cholesterol: 44 mg/dL (ref >39)
LDL, calculated: 55 mg/dL (ref 0–99)
Triglyceride: 156 mg/dL — ABNORMAL HIGH (ref 0–149)
VLDL, calculated: 31 mg/dL (ref 5–40)

## 2017-11-13 LAB — METABOLIC PANEL, COMPREHENSIVE
A-G Ratio: 2.1 (ref 1.2–2.2)
ALT (SGPT): 15 IU/L (ref 0–32)
AST (SGOT): 14 IU/L (ref 0–40)
Albumin: 4 g/dL (ref 3.6–4.8)
Alk. phosphatase: 79 IU/L (ref 39–117)
BUN/Creatinine ratio: 21 (ref 12–28)
BUN: 16 mg/dL (ref 8–27)
Bilirubin, total: 0.4 mg/dL (ref 0.0–1.2)
CO2: 24 mmol/L (ref 20–29)
Calcium: 9.6 mg/dL (ref 8.7–10.3)
Chloride: 107 mmol/L — ABNORMAL HIGH (ref 96–106)
Creatinine: 0.76 mg/dL (ref 0.57–1.00)
GFR est AA: 95 mL/min/{1.73_m2} (ref >59)
GFR est non-AA: 83 mL/min/{1.73_m2} (ref >59)
GLOBULIN, TOTAL: 1.9 g/dL (ref 1.5–4.5)
Glucose: 84 mg/dL (ref 65–99)
Potassium: 4.2 mmol/L (ref 3.5–5.2)
Protein, total: 5.9 g/dL — ABNORMAL LOW (ref 6.0–8.5)
Sodium: 142 mmol/L (ref 134–144)

## 2017-11-13 LAB — HEPATITIS C AB: Hep C Virus Ab: <0.1 s/co ratio (ref 0.0–0.9)

## 2017-11-13 LAB — VITAMIN D, 25 HYDROXY: VITAMIN D, 25-HYDROXY: 48.5 ng/mL (ref 30.0–100.0)

## 2017-11-13 LAB — TSH 3RD GENERATION: TSH: 2.07 u[IU]/mL (ref 0.450–4.500)

## 2017-11-13 LAB — HEPATITIS C ANTIBODY: HCV Ab: <0.1 s/co ratio (ref 0.0–0.9)

## 2017-11-19 LAB — AMB POC FECAL BLOOD, OCCULT, QL 3 CARDS
Hemoccult (POC): NEGATIVE
Occult Blood-2 (POC): NEGATIVE
Occult blood-3 (POC): NEGATIVE

## 2017-12-17 ENCOUNTER — Ambulatory Visit: Admit: 2017-12-17 | Discharge: 2017-12-17 | Payer: MEDICARE | Attending: Specialist | Primary: Specialist

## 2017-12-17 DIAGNOSIS — E559 Vitamin D deficiency, unspecified: Secondary | ICD-10-CM

## 2017-12-17 MED ORDER — ATORVASTATIN 10 MG TAB
10 mg | ORAL_TABLET | Freq: Every day | ORAL | 4 refills | Status: AC
Start: 2017-12-17 — End: ?

## 2017-12-17 NOTE — Progress Notes (Signed)
[facility]  Campbell Pham, M.D.  Sudhirkumar C. Posey Pronto, M.D.  [address]  [place], [place]  Ph No:  [phone]  Fax:  [phone]      Chief Complaint   Patient presents with   ??? Results     lab results        History of Present Illness:  Suzanne Pham is a 67 y.o. female that presents today for follow-up, discussed lab results,    Concerned about the weight gain, discussed importance of diet, exercise, unable to exercise because of her underlying eosinophilic pneumonia/asthma, followed by [facility], patient has follow-up CT scan/an appointment to see [facility] soon,    Reviewed the record from [facility],    Complaining of change in bowel habits, nonspecific pain in the left upper quadrant/epigastric area, no weight loss, no hematemesis, no rectal bleed,    Hypertension stable, history of aortic dissection, stable, no chest pain no shortness of breath, tolerating statins well, LDL within recommendation,    Stable GAD symptoms, on fluoxetine, Xanax on as needed basis,    Patient on vitamin D replacement,    Postmenopausal, discussed preventive screening/immunization,    Allergies   Allergen Reactions   ??? Aspirin Other (comments)     "Flu-like symptoms"   ??? Cymbalta [Duloxetine] Other (comments)     pain   ??? Elavil Other (comments)     pain   ??? Forteo [Teriparatide] Other (comments)   ??? Lyrica [Pregabalin] Other (comments)     Hallucinations and falling   ??? Prolia [Denosumab] Rash   ??? Topamax [Topiramate] Other (comments)     Slurred speach   ??? Ultram [Tramadol] Drowsiness     Past Medical History:   Diagnosis Date   ??? Aorta disorder (HCC)     TYPE B   ??? Arthritis    ??? Asthma    ??? Chronic pain     Back   ??? Heart disease    ??? Osteoporosis    ??? Rib fractures     2009   ??? Vitamin B 12 deficiency    ??? Vitamin D deficiency      Past Surgical History:   Procedure Laterality Date   ??? HX BUNIONECTOMY     ??? HX TONSIL AND ADENOIDECTOMY       Family History   Problem  Relation Age of Onset   ??? Cancer Mother    ??? Cancer Father    ??? Cancer Brother      Social History     Socioeconomic History   ??? Marital status: MARRIED     Spouse name: Not on file   ??? Number of children: Not on file   ??? Years of education: Not on file   ??? Highest education level: Not on file   Occupational History   ??? Not on file   Social Needs   ??? Financial resource strain: Not on file   ??? Food insecurity:     Worry: Not on file     Inability: Not on file   ??? Transportation needs:     Medical: Not on file     Non-medical: Not on file   Tobacco Use   ??? Smoking status: Never Smoker   ??? Smokeless tobacco: Never Used   Substance and Sexual Activity   ??? Alcohol use: No   ??? Drug use: No   ??? Sexual activity: Yes     Partners: Male  Birth control/protection: None   Lifestyle   ??? Physical activity:     Days per week: Not on file     Minutes per session: Not on file   ??? Stress: Not on file   Relationships   ??? Social connections:     Talks on phone: Not on file     Gets together: Not on file     Attends religious service: Not on file     Active member of club or organization: Not on file     Attends meetings of clubs or organizations: Not on file     Relationship status: Not on file   ??? Intimate partner violence:     Fear of current or ex partner: Not on file     Emotionally abused: Not on file     Physically abused: Not on file     Forced sexual activity: Not on file   Other Topics Concern   ??? Not on file   Social History Narrative   ??? Not on file     Current Outpatient Medications   Medication Sig Dispense Refill   ??? atorvastatin (LIPITOR) 10 mg tablet Take 1 Tab by mouth daily. 90 Tab 4   ??? ALPRAZolam (XANAX) 1 mg tablet Take 0.5 Tabs by mouth two (2) times a day. Max Daily Amount: 1 mg. Indications: anxious 30 Tab 3   ??? FLUoxetine (PROZAC) 40 mg capsule Take 1 Cap by mouth daily. 90 Cap 2   ??? metoprolol succinate (TOPROL XL) 50 mg XL tablet Take 50 mg by mouth daily. Pt takes 1/2 tab every day.     ??? ergocalciferol  (ERGOCALCIFEROL) 50,000 unit capsule Take 1 Cap by mouth every seven (7) days. 12 Cap 12   ??? Syringe with Needle, Disp, (BD ECLIPSE LUER-LOK) 3 mL 25 x 5/8" syrg Inject 44m of B12 every 30 days IM 50 Syringe 2   ??? cyanocobalamin, vitamin B-12, 1,000 mcg/mL kit 1 mL by Injection route every month. 10 Kit 2   ??? PROAIR HFA 90 mcg/actuation inhaler INHALE 2 INHALATIONS INTO THE LUNGS EVERY 6 (SIX) HOURS AS NEEDED.  11   ??? FLOVENT HFA 220 mcg/actuation inhaler Take 220 mcg by inhalation two (2) times a day.  11   ??? cpap machine kit by Does Not Apply route.           REVIEW OF SYSTEMS:    GENERAL: negative for - chills, fatigue, fever, hot flashes, malaise, night sweats, sleep disturbance, weight gain or weight loss.    EYES: negative for  - blurred vision, double vision, photophobia, pain, discharge and redness.    ENT AND MOUTH: negative for - epistaxis, headaches, hearing change, nasal   discharge, nasal polyps, oral lesions, facial pain,, sneezing, sore throat, tinnitus, vertigo, visual changes or vocal changes.    CARDIOVASCULAR: negative for - chest pain, dyspnea on exertion, pedal edema, irregular heartbeat, loss of consciousness,  orthopnea, palpitations, paroxysmal nocturnal dyspnea, resting shortness of breath.    RESPIRATORY:  negative for - cough wheezing, hemoptysis, orthopnea, pleuritic pain, shortness of breath, sputum changes,  tachypnea     GASTROINTESTINAL: negative for -, appetite loss, blood in stools, ,, diarrhea, gas/bloating, heartburn, hematemesis, melena, nausea/vomiting, stool incontinence or swallowing difficulty    GENITOURINARY FEMALE: negative for -change in menstrual cycle, change in urinary stream, dysmenorrhea, dyspareunia, dysuria, genital discharge, genital ulcers, hematuria, incontinence, irregular/heavy menses, pelvic pain, urinary frequency/urgency and decrease in libido     NEUROLOGICAL: negative for -  behavioral changes, bowel and bladder control changes, confusion, dizziness, gait  disturbance, headaches, sensory changes, motor weakness, seizures, speech problems, tremors, visual changes or weakness    PSYCHIATRIC: negative for - anxiety, behavioral disorder, concentration difficulties, , depressive symptoms, disorientation, hallucinations, delusions, irritability, memory difficulties, mood swings, obsessive thoughts, physical abuse, sleep disturbances or suicidal ideation.    MUSCULOSKELETAL: negative for - gait disturbance, joint pain, joint stiffness, joint swelling, muscle pain, muscular weakness,     INTEGUMENTARY (BREASTS): negative for - a breast mass, a palpable lump in the breast, breast pain, nipple discharge     ENDOCRINE: negative for polydipsia, polyuria, polydypsia, cold-heat intolerance,  hot flashes,    HEM/LYMPH: negative for anemia, bruising, bleeding, lymph node enlargement,     ALLERGY/IMMUNOLOGIC: negative for allergies to medicine, food, dyes, hepatitis,    PHYSICAL EXAM  General appearance - alert, well appearing, and in no distress    Mental status - alert, oriented to person, place, and time    Eyes - pupils equal and reactive, extraocular eye movements intact    Ears - bilateral TM's and external ear canals normal    Nose - normal and patent, no erythema, discharge or polyps    Neck - supple, no significant adenopathy, carotids upstroke normal bilaterally, no bruits, thyroid exam: thyroid is normal in size without nodules or tenderness    Throat / Mouth - no erythema, no tonsillar exudate, normal dental hygiene, no ulcers    Chest - clear to auscultation, no wheezes, rales or rhonchi, symmetric air entry    Heart - normal rate and regular rhythm, S1 and S2 normal, no gallops noted, no murmur    Abdomen - soft, left upper quadrant/epigastric tenderness, nondistended, no masses or organomegaly    Back exam - full range of motion, no tenderness, palpable spasm or pain.    Neurological - alert, oriented, normal speech, no focal findings or movement disorder  noted    Musculoskeletal - no joint tenderness, deformity or swelling    Extremities - peripheral pulses normal, no pedal edema, no clubbing or cyanosis    Skin - normal coloration and turgor, no rashes, no suspicious skin lesions noted    Immunization History   Administered Date(s) Administered   ??? Hep B Vaccine 10/18/1993   ??? Influenza Vaccine 10/18/2009, 04/09/2014, 03/22/2015, 04/09/2016   ??? Influenza Vaccine (Quad) PF 04/09/2017   ??? Influenza Vaccine PF 06/05/2011, 04/03/2012   ??? Pneumococcal Conjugate (PCV-13) 02/13/2017   ??? Pneumococcal Polysaccharide (PPSV-23) 07/10/2010, 05/02/2014, 10/15/2014   ??? Tdap 10/19/2007, 10/15/2014          LABS:   Results for orders placed or performed in visit on 11/12/17   LIPID PANEL   Result Value Ref Range    Cholesterol, total 130 100 - 199 mg/dL    Triglyceride 156 (H) 0 - 149 mg/dL    HDL Cholesterol 44 >39 mg/dL    VLDL, calculated 31 5 - 40 mg/dL    LDL, calculated 55 0 - 99 mg/dL   METABOLIC PANEL, COMPREHENSIVE   Result Value Ref Range    Glucose 84 65 - 99 mg/dL    BUN 16 8 - 27 mg/dL    Creatinine 0.76 0.57 - 1.00 mg/dL    GFR est non-AA 83 >59 mL/min/1.73    GFR est AA 95 >59 mL/min/1.73    BUN/Creatinine ratio 21 12 - 28    Sodium 142 134 - 144 mmol/L    Potassium 4.2 3.5 - 5.2 mmol/L  Chloride 107 (H) 96 - 106 mmol/L    CO2 24 20 - 29 mmol/L    Calcium 9.6 8.7 - 10.3 mg/dL    Protein, total 5.9 (L) 6.0 - 8.5 g/dL    Albumin 4.0 3.6 - 4.8 g/dL    GLOBULIN, TOTAL 1.9 1.5 - 4.5 g/dL    A-G Ratio 2.1 1.2 - 2.2    Bilirubin, total 0.4 0.0 - 1.2 mg/dL    Alk. phosphatase 79 39 - 117 IU/L    AST (SGOT) 14 0 - 40 IU/L    ALT (SGPT) 15 0 - 32 IU/L   TSH 3RD GENERATION   Result Value Ref Range    TSH 2.070 0.450 - 4.500 uIU/mL   VITAMIN D, 25 HYDROXY   Result Value Ref Range    VITAMIN D, 25-HYDROXY 48.5 30.0 - 100.0 ng/mL   HEPATITIS C AB   Result Value Ref Range    Hep C Virus Ab <0.1 0.0 - 0.9 s/co ratio   AMB POC COMPLETE CBC,AUTOMATED ENTER   Result Value Ref Range     WBC (POC) 6.8 4.5 - 10.5 10^3/ul    LYMPHOCYTES (POC) 37.0 20.5 - 51.1 %    MONOCYTES (POC) 5.4 1.7 - 9.3 %    GRANULOCYTES (POC) 57.6 42.2 - 75.2 %    ABS. LYMPHS (POC) 2.5 1.2 - 3.4 10^3/ul    ABS. MONOS (POC) 0.4 0.1 - 0.6 10^3/ul    ABS. GRANS (POC) 3.9 1.4 - 6.5 10^3/ul    RBC (POC) 4.40 4 - 6 10^6/ul    HGB (POC) 13.4 11 - 18 g/dL    HCT (POC) 39.7 35 - 60 %    MCV (POC) 90.3 80 - 99.9 fL    MCH (POC) 30.6 27 - 31 pg    MCHC (POC) 33.9 33 - 37 g/dL    RDW (POC) 13.8 (A) 11.6 - 13.7 %    PLATELET (POC) 228 150 - 450 10^3/ul    MPV (POC) 9.4 7.8 - 11 fL   AMB POC URINALYSIS DIP STICK AUTO W/O MICRO   Result Value Ref Range    Color (UA POC) Yellow     Clarity (UA POC) Clear     Glucose (UA POC) Negative Negative mg/dL    Bilirubin (UA POC) Negative Negative    Ketones (UA POC) Negative Negative    Specific gravity (UA POC) 1.015 1.001 - 1.035    Blood (UA POC) Negative Negative    pH (UA POC) 5.5 4.6 - 8.0    Protein (UA POC) Negative Negative    Urobilinogen (UA POC) 0.2 mg/dL 0.2 - 1    Nitrites (UA POC) Negative Negative    Leukocyte esterase (UA POC) Negative Negative   AMB POC FECAL BLOOD, OCCULT, QL 3 CARDS   Result Value Ref Range    VALID INTERNAL CONTROL POC Yes     Hemoccult (POC) Negative Negative    Occult Blood-2 (POC) Negative     Occult blood-3 (POC) Negative          ASSESSMENT AND PLAN  Diagnoses and all orders for this visit:    1. Vitamin D deficiency    2. Pure hypercholesterolemia  -     atorvastatin (LIPITOR) 10 mg tablet; Take 1 Tab by mouth daily.    3. 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 Obstructive sleep apnea    6. Epigastric pain  Advised the patient try OTC Pepcid 40 mg once a day, advised the patient to follow-up with GI for EGD and colonoscopy,    Continue fluoxetine/Xanax and PRN basis,     Continue B12 supplementation.    Hypertension stable, continue statins as directed.    Patient has an appointment to see GI for EGD and  colonoscopy, continue vitamin D replacement as directed.          FAOZHYQM Theodoro Kos, MD

## 2017-12-17 NOTE — Progress Notes (Signed)
[facility]  Campbell Riches, M.D.  Sudhirkumar C. Posey Pronto, M.D.  [address]  [place], [place]  Ph No:  [phone]  Fax:  [phone]      Chief Complaint   Patient presents with   ??? Results     lab results        History of Present Illness:  [name] is a 67 y.o. female that presents today for follow-up, discussed lab results,    Concerned about the weight gain, discussed importance of diet, exercise, unable to exercise because of her underlying eosinophilic pneumonia/asthma, followed by [facility], patient has follow-up CT scan/an appointment to see [facility] soon,    Reviewed the record from [facility],    Complaining of change in bowel habits, nonspecific pain in the left upper quadrant/epigastric area, no weight loss, no hematemesis, no rectal bleed,    Hypertension stable, history of aortic dissection, stable, no chest pain no shortness of breath, tolerating statins well, LDL within recommendation,    Stable GAD symptoms, on fluoxetine, Xanax on as needed basis,    Patient on vitamin D replacement,    Postmenopausal, discussed preventive screening/immunization,    Allergies   Allergen Reactions   ??? Aspirin Other (comments)     "Flu-like symptoms"   ??? Cymbalta [Duloxetine] Other (comments)     pain   ??? Elavil Other (comments)     pain   ??? Forteo [Teriparatide] Other (comments)   ??? Lyrica [Pregabalin] Other (comments)     Hallucinations and falling   ??? Prolia [Denosumab] Rash   ??? Topamax [Topiramate] Other (comments)     Slurred speach   ??? Ultram [Tramadol] Drowsiness     Past Medical History:   Diagnosis Date   ??? Aorta disorder (HCC)     TYPE B   ??? Arthritis    ??? Asthma    ??? Chronic pain     Back   ??? Heart disease    ??? Osteoporosis    ??? Rib fractures     2009   ??? Vitamin B 12 deficiency    ??? Vitamin D deficiency      Past Surgical History:   Procedure Laterality Date   ??? HX BUNIONECTOMY     ??? HX TONSIL AND ADENOIDECTOMY       Family History    Problem Relation Age of Onset   ??? Cancer Mother    ??? Cancer Father    ??? Cancer Brother      Social History     Socioeconomic History   ??? Marital status: MARRIED     Spouse name: Not on file   ??? Number of children: Not on file   ??? Years of education: Not on file   ??? Highest education level: Not on file   Occupational History   ??? Not on file   Social Needs   ??? Financial resource strain: Not on file   ??? Food insecurity:     Worry: Not on file     Inability: Not on file   ??? Transportation needs:     Medical: Not on file     Non-medical: Not on file   Tobacco Use   ??? Smoking status: Never Smoker   ??? Smokeless tobacco: Never Used   Substance and Sexual Activity   ??? Alcohol use: No   ??? Drug use: No   ??? Sexual activity: Yes     Partners: Male  Birth control/protection: None   Lifestyle   ??? Physical activity:     Days per week: Not on file     Minutes per session: Not on file   ??? Stress: Not on file   Relationships   ??? Social connections:     Talks on phone: Not on file     Gets together: Not on file     Attends religious service: Not on file     Active member of club or organization: Not on file     Attends meetings of clubs or organizations: Not on file     Relationship status: Not on file   ??? Intimate partner violence:     Fear of current or ex partner: Not on file     Emotionally abused: Not on file     Physically abused: Not on file     Forced sexual activity: Not on file   Other Topics Concern   ??? Not on file   Social History Narrative   ??? Not on file     Current Outpatient Medications   Medication Sig Dispense Refill   ??? atorvastatin (LIPITOR) 10 mg tablet Take 1 Tab by mouth daily. 90 Tab 4   ??? ALPRAZolam (XANAX) 1 mg tablet Take 0.5 Tabs by mouth two (2) times a day. Max Daily Amount: 1 mg. Indications: anxious 30 Tab 3   ??? FLUoxetine (PROZAC) 40 mg capsule Take 1 Cap by mouth daily. 90 Cap 2   ??? metoprolol succinate (TOPROL XL) 50 mg XL tablet Take 50 mg by mouth daily. Pt takes 1/2 tab every day.      ??? ergocalciferol (ERGOCALCIFEROL) 50,000 unit capsule Take 1 Cap by mouth every seven (7) days. 12 Cap 12   ??? Syringe with Needle, Disp, (BD ECLIPSE LUER-LOK) 3 mL 25 x 5/8" syrg Inject 26m of B12 every 30 days IM 50 Syringe 2   ??? cyanocobalamin, vitamin B-12, 1,000 mcg/mL kit 1 mL by Injection route every month. 10 Kit 2   ??? PROAIR HFA 90 mcg/actuation inhaler INHALE 2 INHALATIONS INTO THE LUNGS EVERY 6 (SIX) HOURS AS NEEDED.  11   ??? FLOVENT HFA 220 mcg/actuation inhaler Take 220 mcg by inhalation two (2) times a day.  11   ??? cpap machine kit by Does Not Apply route.           REVIEW OF SYSTEMS:    GENERAL: negative for - chills, fatigue, fever, hot flashes, malaise, night sweats, sleep disturbance, weight gain or weight loss.    EYES: negative for  - blurred vision, double vision, photophobia, pain, discharge and redness.    ENT AND MOUTH: negative for - epistaxis, headaches, hearing change, nasal   discharge, nasal polyps, oral lesions, facial pain,, sneezing, sore throat, tinnitus, vertigo, visual changes or vocal changes.    CARDIOVASCULAR: negative for - chest pain, dyspnea on exertion, pedal edema, irregular heartbeat, loss of consciousness,  orthopnea, palpitations, paroxysmal nocturnal dyspnea, resting shortness of breath.    RESPIRATORY:  negative for - cough wheezing, hemoptysis, orthopnea, pleuritic pain, shortness of breath, sputum changes,  tachypnea     GASTROINTESTINAL: negative for -, appetite loss, blood in stools, ,, diarrhea, gas/bloating, heartburn, hematemesis, melena, nausea/vomiting, stool incontinence or swallowing difficulty    GENITOURINARY FEMALE: negative for -change in menstrual cycle, change in urinary stream, dysmenorrhea, dyspareunia, dysuria, genital discharge, genital ulcers, hematuria, incontinence, irregular/heavy menses, pelvic pain, urinary frequency/urgency and decrease in libido     NEUROLOGICAL: negative for -  behavioral changes, bowel and bladder control  changes, confusion, dizziness, gait disturbance, headaches, sensory changes, motor weakness, seizures, speech problems, tremors, visual changes or weakness    PSYCHIATRIC: negative for - anxiety, behavioral disorder, concentration difficulties, , depressive symptoms, disorientation, hallucinations, delusions, irritability, memory difficulties, mood swings, obsessive thoughts, physical abuse, sleep disturbances or suicidal ideation.    MUSCULOSKELETAL: negative for - gait disturbance, joint pain, joint stiffness, joint swelling, muscle pain, muscular weakness,     INTEGUMENTARY (BREASTS): negative for - a breast mass, a palpable lump in the breast, breast pain, nipple discharge     ENDOCRINE: negative for polydipsia, polyuria, polydypsia, cold-heat intolerance,  hot flashes,    HEM/LYMPH: negative for anemia, bruising, bleeding, lymph node enlargement,     ALLERGY/IMMUNOLOGIC: negative for allergies to medicine, food, dyes, hepatitis,    PHYSICAL EXAM  General appearance - alert, well appearing, and in no distress    Mental status - alert, oriented to person, place, and time    Eyes - pupils equal and reactive, extraocular eye movements intact    Ears - bilateral TM's and external ear canals normal    Nose - normal and patent, no erythema, discharge or polyps    Neck - supple, no significant adenopathy, carotids upstroke normal bilaterally, no bruits, thyroid exam: thyroid is normal in size without nodules or tenderness    Throat / Mouth - no erythema, no tonsillar exudate, normal dental hygiene, no ulcers    Chest - clear to auscultation, no wheezes, rales or rhonchi, symmetric air entry    Heart - normal rate and regular rhythm, S1 and S2 normal, no gallops noted, no murmur    Abdomen - soft, left upper quadrant/epigastric tenderness, nondistended, no masses or organomegaly    Back exam - full range of motion, no tenderness, palpable spasm or pain.     Neurological - alert, oriented, normal speech, no focal findings or movement disorder noted    Musculoskeletal - no joint tenderness, deformity or swelling    Extremities - peripheral pulses normal, no pedal edema, no clubbing or cyanosis    Skin - normal coloration and turgor, no rashes, no suspicious skin lesions noted    Immunization History   Administered Date(s) Administered   ??? Hep B Vaccine 10/18/1993   ??? Influenza Vaccine 10/18/2009, 04/09/2014, 03/22/2015, 04/09/2016   ??? Influenza Vaccine (Quad) PF 04/09/2017   ??? Influenza Vaccine PF 06/05/2011, 04/03/2012   ??? Pneumococcal Conjugate (PCV-13) 02/13/2017   ??? Pneumococcal Polysaccharide (PPSV-23) 07/10/2010, 05/02/2014, 10/15/2014   ??? Tdap 10/19/2007, 10/15/2014          LABS:   Results for orders placed or performed in visit on 11/12/17   LIPID PANEL   Result Value Ref Range    Cholesterol, total 130 100 - 199 mg/dL    Triglyceride 156 (H) 0 - 149 mg/dL    HDL Cholesterol 44 >39 mg/dL    VLDL, calculated 31 5 - 40 mg/dL    LDL, calculated 55 0 - 99 mg/dL   METABOLIC PANEL, COMPREHENSIVE   Result Value Ref Range    Glucose 84 65 - 99 mg/dL    BUN 16 8 - 27 mg/dL    Creatinine 0.76 0.57 - 1.00 mg/dL    GFR est non-AA 83 >59 mL/min/1.73    GFR est AA 95 >59 mL/min/1.73    BUN/Creatinine ratio 21 12 - 28    Sodium 142 134 - 144 mmol/L    Potassium 4.2 3.5 - 5.2 mmol/L  Chloride 107 (H) 96 - 106 mmol/L    CO2 24 20 - 29 mmol/L    Calcium 9.6 8.7 - 10.3 mg/dL    Protein, total 5.9 (L) 6.0 - 8.5 g/dL    Albumin 4.0 3.6 - 4.8 g/dL    GLOBULIN, TOTAL 1.9 1.5 - 4.5 g/dL    A-G Ratio 2.1 1.2 - 2.2    Bilirubin, total 0.4 0.0 - 1.2 mg/dL    Alk. phosphatase 79 39 - 117 IU/L    AST (SGOT) 14 0 - 40 IU/L    ALT (SGPT) 15 0 - 32 IU/L   TSH 3RD GENERATION   Result Value Ref Range    TSH 2.070 0.450 - 4.500 uIU/mL   VITAMIN D, 25 HYDROXY   Result Value Ref Range    VITAMIN D, 25-HYDROXY 48.5 30.0 - 100.0 ng/mL   HEPATITIS C AB   Result Value Ref Range     Hep C Virus Ab <0.1 0.0 - 0.9 s/co ratio   AMB POC COMPLETE CBC,AUTOMATED ENTER   Result Value Ref Range    WBC (POC) 6.8 4.5 - 10.5 10^3/ul    LYMPHOCYTES (POC) 37.0 20.5 - 51.1 %    MONOCYTES (POC) 5.4 1.7 - 9.3 %    GRANULOCYTES (POC) 57.6 42.2 - 75.2 %    ABS. LYMPHS (POC) 2.5 1.2 - 3.4 10^3/ul    ABS. MONOS (POC) 0.4 0.1 - 0.6 10^3/ul    ABS. GRANS (POC) 3.9 1.4 - 6.5 10^3/ul    RBC (POC) 4.40 4 - 6 10^6/ul    HGB (POC) 13.4 11 - 18 g/dL    HCT (POC) 39.7 35 - 60 %    MCV (POC) 90.3 80 - 99.9 fL    MCH (POC) 30.6 27 - 31 pg    MCHC (POC) 33.9 33 - 37 g/dL    RDW (POC) 13.8 (A) 11.6 - 13.7 %    PLATELET (POC) 228 150 - 450 10^3/ul    MPV (POC) 9.4 7.8 - 11 fL   AMB POC URINALYSIS DIP STICK AUTO W/O MICRO   Result Value Ref Range    Color (UA POC) Yellow     Clarity (UA POC) Clear     Glucose (UA POC) Negative Negative mg/dL    Bilirubin (UA POC) Negative Negative    Ketones (UA POC) Negative Negative    Specific gravity (UA POC) 1.015 1.001 - 1.035    Blood (UA POC) Negative Negative    pH (UA POC) 5.5 4.6 - 8.0    Protein (UA POC) Negative Negative    Urobilinogen (UA POC) 0.2 mg/dL 0.2 - 1    Nitrites (UA POC) Negative Negative    Leukocyte esterase (UA POC) Negative Negative   AMB POC FECAL BLOOD, OCCULT, QL 3 CARDS   Result Value Ref Range    VALID INTERNAL CONTROL POC Yes     Hemoccult (POC) Negative Negative    Occult Blood-2 (POC) Negative     Occult blood-3 (POC) Negative          ASSESSMENT AND PLAN  Diagnoses and all orders for this visit:    1. Vitamin D deficiency    2. Pure hypercholesterolemia  -     atorvastatin (LIPITOR) 10 mg tablet; Take 1 Tab by mouth daily.    3. 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 Obstructive sleep apnea    6. Epigastric pain  Advised the patient try OTC Pepcid 40 mg once a day, advised the patient to follow-up with GI for EGD and colonoscopy,    Continue fluoxetine/Xanax and PRN basis,     Continue B12 supplementation.     Hypertension stable, continue statins as directed.    Patient has an appointment to see GI for EGD and colonoscopy, continue vitamin D replacement as directed.          EXHBZJIR Theodoro Kos, MD

## 2018-02-18 ENCOUNTER — Ambulatory Visit: Admit: 2018-02-18 | Discharge: 2018-02-18 | Payer: MEDICARE | Attending: Specialist | Primary: Specialist

## 2018-02-18 ENCOUNTER — Ambulatory Visit: Attending: Specialist | Primary: Specialist

## 2018-02-18 DIAGNOSIS — S82891D Other fracture of right lower leg, subsequent encounter for closed fracture with routine healing: Secondary | ICD-10-CM

## 2018-02-18 MED ORDER — GABAPENTIN 100 MG CAP
100 mg | ORAL_CAPSULE | Freq: Three times a day (TID) | ORAL | 2 refills | Status: DC
Start: 2018-02-18 — End: 2018-02-18

## 2018-02-18 MED ORDER — CYANOCOBALAMIN (VIT B-12) 1,000 MCG/ML INJECTION KIT
1000 mcg/mL | PACK | INTRAMUSCULAR | 2 refills | Status: DC
Start: 2018-02-18 — End: 2019-03-04

## 2018-02-18 MED ORDER — GABAPENTIN 100 MG CAP
100 mg | ORAL_CAPSULE | Freq: Three times a day (TID) | ORAL | 2 refills | Status: DC
Start: 2018-02-18 — End: 2018-08-28

## 2018-02-18 MED ORDER — CYANOCOBALAMIN (VIT B-12) 1,000 MCG/ML INJECTION KIT
1000 mcg/mL | PACK | INTRAMUSCULAR | 2 refills | Status: DC
Start: 2018-02-18 — End: 2018-02-18

## 2018-02-18 MED ORDER — SYRINGE WITH NEEDLE 3 ML 25 X 5/8"
3 mL 25 x 5/8" | INJECTION | 2 refills | Status: AC
Start: 2018-02-18 — End: ?

## 2018-02-18 MED ORDER — FLOVENT HFA 220 MCG/ACTUATION AEROSOL INHALER
220 mcg/actuation | Freq: Two times a day (BID) | RESPIRATORY_TRACT | 11 refills | Status: AC
Start: 2018-02-18 — End: ?

## 2018-02-18 NOTE — Progress Notes (Signed)
[facility]  Suzanne Pham, M.D.  Suzanne Pham, M.D.  [address]  [place], [place]  Ph No:  [phone]  Fax:  [phone]      Chief Complaint   Patient presents with   ??? Follow Up Chronic Condition   ??? Fall       History of Present Illness:  Suzanne Pham is a 67 y.o. 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    Hyperlipidemia Lipitor, no myalgia noted.    Patient on B12 injection, given the prescription for the same,     Denies any GAD/depressed symptoms, tolerating fluoxetine/Xanax and PRN basis,    Patient on vitamin D replacement at this time.    Allergies   Allergen Reactions   ??? Aspirin Other (comments)     "Flu-like symptoms"   ??? Cymbalta [Duloxetine] Other (comments)     pain   ??? Elavil Other (comments)     pain   ??? Forteo [Teriparatide] Other (comments)   ??? Lyrica [Pregabalin] Other (comments)     Hallucinations and falling   ??? Prolia [Denosumab] Rash   ??? Topamax [Topiramate] Other (comments)     Slurred speach   ??? Ultram [Tramadol] Drowsiness     Past Medical History:   Diagnosis Date   ??? Aorta disorder (HCC)     TYPE B   ??? Arthritis    ??? Asthma    ??? Chronic pain     Back   ??? Heart disease    ??? Osteoporosis    ??? Rib fractures     2009   ??? Vitamin B 12 deficiency    ??? Vitamin D deficiency      Past Surgical History:   Procedure Laterality Date   ??? HX BUNIONECTOMY     ??? HX TONSIL AND ADENOIDECTOMY       Family History    Problem Relation Age of Onset   ??? Cancer Mother    ??? Cancer Father    ??? Cancer Brother      Social History     Socioeconomic History   ??? Marital status: MARRIED     Spouse name: Not on file   ??? Number of children: Not on file   ??? Years of education: Not on file   ??? Highest education level: Not on file   Occupational History   ??? Not on file   Social Needs   ??? Financial resource strain: Not on file   ??? Food insecurity:     Worry: Not on file     Inability: Not on file   ??? Transportation needs:     Medical: Not on file     Non-medical: Not on file   Tobacco Use   ??? Smoking status: Never Smoker   ??? Smokeless tobacco: Never Used   Substance and Sexual Activity   ??? Alcohol use: No   ??? Drug use: No   ??? Sexual activity: Yes  Partners: Male     Birth control/protection: None   Lifestyle   ??? Physical activity:     Days per week: Not on file     Minutes per session: Not on file   ??? Stress: Not on file   Relationships   ??? Social connections:     Talks on phone: Not on file     Gets together: Not on file     Attends religious service: Not on file     Active member of club or organization: Not on file     Attends meetings of clubs or organizations: Not on file     Relationship status: Not on file   ??? Intimate partner violence:     Fear of current or ex partner: Not on file     Emotionally abused: Not on file     Physically abused: Not on file     Forced sexual activity: Not on file   Other Topics Concern   ??? Not on file   Social History Narrative   ??? Not on file     Current Outpatient Medications   Medication Sig Dispense Refill   ??? FLOVENT HFA 220 mcg/actuation inhaler Take 1 Puff by inhalation two (2) times a day. 1 Inhaler 11   ??? Syringe with Needle, Disp, (BD ECLIPSE LUER-LOK) 3 mL 25 x 5/8" syrg Inject 13m of B12 every 30 days IM 50 Syringe 2   ??? cyanocobalamin, vitamin B-12, 1,000 mcg/mL kit 1 mL by Injection route every month. 10 Kit 2   ??? gabapentin (NEURONTIN) 100 mg capsule Take 1 Cap by mouth three (3) times  daily. 90 Cap 2   ??? atorvastatin (LIPITOR) 10 mg tablet Take 1 Tab by mouth daily. 90 Tab 4   ??? ALPRAZolam (XANAX) 1 mg tablet Take 0.5 Tabs by mouth two (2) times a day. Max Daily Amount: 1 mg. Indications: anxious 30 Tab 3   ??? FLUoxetine (PROZAC) 40 mg capsule Take 1 Cap by mouth daily. 90 Cap 2   ??? metoprolol succinate (TOPROL XL) 50 mg XL tablet Take 50 mg by mouth daily. Pt takes 1/2 tab every day.     ??? ergocalciferol (ERGOCALCIFEROL) 50,000 unit capsule Take 1 Cap by mouth every seven (7) days. 12 Cap 12   ??? PROAIR HFA 90 mcg/actuation inhaler INHALE 2 INHALATIONS INTO THE LUNGS EVERY 6 (SIX) HOURS AS NEEDED.  11   ??? cpap machine kit by Does Not Apply route.           REVIEW OF SYSTEMS:    GENERAL: negative for - chills, fatigue, fever, hot flashes, malaise, night sweats, sleep disturbance, weight gain or weight loss.    EYES: negative for  - blurred vision, double vision, photophobia, pain, discharge and redness.    ENT AND MOUTH: negative for - epistaxis, headaches, hearing change, nasal   discharge, nasal polyps, oral lesions, facial pain,, sneezing, sore throat, tinnitus, vertigo, visual changes or vocal changes.    CARDIOVASCULAR: negative for - chest pain, dyspnea on exertion, pedal edema, irregular heartbeat, loss of consciousness,  orthopnea, palpitations, paroxysmal nocturnal dyspnea, resting shortness of breath.    RESPIRATORY:  negative for - cough wheezing, hemoptysis, orthopnea, pleuritic pain, shortness of breath, sputum changes,  tachypnea     GASTROINTESTINAL: negative for - abdominal pain, appetite loss, blood in stools, change in bowel habits, change in stools, constipation, diarrhea, gas/bloating, heartburn, hematemesis, melena, nausea/vomiting, stool incontinence or swallowing difficulty    GENITOURINARY FEMALE:  negative for -change in menstrual cycle, change in urinary stream, dysmenorrhea, dyspareunia, dysuria, genital discharge, genital ulcers, hematuria, incontinence, irregular/heavy  menses, pelvic pain, urinary frequency/urgency and decrease in libido     NEUROLOGICAL: negative for - behavioral changes, bowel and bladder control changes, confusion, dizziness, gait disturbance, headaches, sensory changes, motor weakness, seizures, speech problems, tremors, visual changes or weakness    PSYCHIATRIC: negative for - anxiety, behavioral disorder, concentration difficulties, , depressive symptoms, disorientation, hallucinations, delusions, irritability, memory difficulties, mood swings, obsessive thoughts, physical abuse, sleep disturbances or suicidal ideation.    MUSCULOSKELETAL: Positive symptoms as above    INTEGUMENTARY (BREASTS): negative for - a breast mass, a palpable lump in the breast, breast pain, nipple discharge     ENDOCRINE: negative for polydipsia, polyuria, polydypsia, cold-heat intolerance,  hot flashes,    HEM/LYMPH: negative for anemia, bruising, bleeding, lymph node enlargement,     ALLERGY/IMMUNOLOGIC: negative for allergies to medicine, food, dyes, hepatitis,    PHYSICAL EXAM  General appearance - alert, well appearing, and in no distress    Mental status - alert, oriented to person, place, and time    Eyes - pupils equal and reactive, extraocular eye movements intact    Ears - bilateral TM's and external ear canals normal    Nose - normal and patent, no erythema, discharge or polyps    Neck - supple, no significant adenopathy, carotids upstroke normal bilaterally, no bruits, thyroid exam: thyroid is normal in size without nodules or tenderness    Throat / Mouth - no erythema, no tonsillar exudate, normal dental hygiene, no ulcers    Chest - clear to auscultation, no wheezes, rales or rhonchi, symmetric air entry, positive rib tenderness,    Heart - normal rate and regular rhythm, S1 and S2 normal, no gallops noted, no murmur    Abdomen - soft, nontender, nondistended, no masses or organomegaly    Back exam - full range of motion, no tenderness, palpable spasm or  pain.    Neurological - alert, oriented, normal speech, no focal findings or movement disorder noted    Musculoskeletal -examination of the right ankle, tenderness anterior ligament, mild swelling, nonspecific superficial skin abrasion/soft tissue swelling, lateral malleolar tenderness, no ankle effusion, no ankle tenderness,    Extremities - peripheral pulses normal, no pedal edema, no clubbing or cyanosis    Skin - normal coloration and turgor, no rashes, no suspicious skin lesions noted    Immunization History   Administered Date(s) Administered   ??? Hep B Vaccine 10/18/1993   ??? Influenza Vaccine 10/18/2009, 04/09/2014, 03/22/2015, 04/09/2016   ??? Influenza Vaccine (Quad) PF 04/09/2017   ??? Influenza Vaccine PF 06/05/2011, 04/03/2012   ??? Pneumococcal Conjugate (PCV-13) 02/13/2017   ??? Pneumococcal Polysaccharide (PPSV-23) 07/10/2010, 05/02/2014, 10/15/2014   ??? Tdap 10/19/2007, 10/15/2014          LABS:   Results for orders placed or performed in visit on 11/12/17   LIPID PANEL   Result Value Ref Range    Cholesterol, total 130 100 - 199 mg/dL    Triglyceride 156 (H) 0 - 149 mg/dL    HDL Cholesterol 44 >39 mg/dL    VLDL, calculated 31 5 - 40 mg/dL    LDL, calculated 55 0 - 99 mg/dL   METABOLIC PANEL, COMPREHENSIVE   Result Value Ref Range    Glucose 84 65 - 99 mg/dL    BUN 16 8 - 27 mg/dL    Creatinine 0.76 0.57 - 1.00 mg/dL  GFR est non-AA 83 >59 mL/min/1.73    GFR est AA 95 >59 mL/min/1.73    BUN/Creatinine ratio 21 12 - 28    Sodium 142 134 - 144 mmol/L    Potassium 4.2 3.5 - 5.2 mmol/L    Chloride 107 (H) 96 - 106 mmol/L    CO2 24 20 - 29 mmol/L    Calcium 9.6 8.7 - 10.3 mg/dL    Protein, total 5.9 (L) 6.0 - 8.5 g/dL    Albumin 4.0 3.6 - 4.8 g/dL    GLOBULIN, TOTAL 1.9 1.5 - 4.5 g/dL    A-G Ratio 2.1 1.2 - 2.2    Bilirubin, total 0.4 0.0 - 1.2 mg/dL    Alk. phosphatase 79 39 - 117 IU/L    AST (SGOT) 14 0 - 40 IU/L    ALT (SGPT) 15 0 - 32 IU/L   TSH 3RD GENERATION   Result Value Ref Range    TSH 2.070 0.450 -  4.500 uIU/mL   VITAMIN D, 25 HYDROXY   Result Value Ref Range    VITAMIN D, 25-HYDROXY 48.5 30.0 - 100.0 ng/mL   HEPATITIS C AB   Result Value Ref Range    Hep C Virus Ab <0.1 0.0 - 0.9 s/co ratio   AMB POC COMPLETE CBC,AUTOMATED ENTER   Result Value Ref Range    WBC (POC) 6.8 4.5 - 10.5 10^3/ul    LYMPHOCYTES (POC) 37.0 20.5 - 51.1 %    MONOCYTES (POC) 5.4 1.7 - 9.3 %    GRANULOCYTES (POC) 57.6 42.2 - 75.2 %    ABS. LYMPHS (POC) 2.5 1.2 - 3.4 10^3/ul    ABS. MONOS (POC) 0.4 0.1 - 0.6 10^3/ul    ABS. GRANS (POC) 3.9 1.4 - 6.5 10^3/ul    RBC (POC) 4.40 4 - 6 10^6/ul    HGB (POC) 13.4 11 - 18 g/dL    HCT (POC) 39.7 35 - 60 %    MCV (POC) 90.3 80 - 99.9 fL    MCH (POC) 30.6 27 - 31 pg    MCHC (POC) 33.9 33 - 37 g/dL    RDW (POC) 13.8 (A) 11.6 - 13.7 %    PLATELET (POC) 228 150 - 450 10^3/ul    MPV (POC) 9.4 7.8 - 11 fL   AMB POC URINALYSIS DIP STICK AUTO W/O MICRO   Result Value Ref Range    Color (UA POC) Yellow     Clarity (UA POC) Clear     Glucose (UA POC) Negative Negative mg/dL    Bilirubin (UA POC) Negative Negative    Ketones (UA POC) Negative Negative    Specific gravity (UA POC) 1.015 1.001 - 1.035    Blood (UA POC) Negative Negative    pH (UA POC) 5.5 4.6 - 8.0    Protein (UA POC) Negative Negative    Urobilinogen (UA POC) 0.2 mg/dL 0.2 - 1    Nitrites (UA POC) Negative Negative    Leukocyte esterase (UA POC) Negative Negative   AMB POC FECAL BLOOD, OCCULT, QL 3 CARDS   Result Value Ref Range    VALID INTERNAL CONTROL POC Yes     Hemoccult (POC) Negative Negative    Occult Blood-2 (POC) Negative     Occult blood-3 (POC) Negative          ASSESSMENT AND PLAN  Diagnoses and all orders for this visit:    1. Closed fracture of right ankle with routine healing, subsequent encounter  -  gabapentin (NEURONTIN) 100 mg capsule; Take 1 Cap by mouth three (3) times daily.    2. Vitamin B 12 deficiency  -     Syringe with Needle, Disp, (BD ECLIPSE LUER-LOK) 3 mL 25 x 5/8" syrg; Inject 82m of B12 every 30 days IM  -      cyanocobalamin, vitamin B-12, 1,000 mcg/mL kit; 1 mL by Injection route every month.    3. BMI 32.0-32.9,adult    4. Vitamin D deficiency    5. Pure hypercholesterolemia    6. Chronic eosinophilic pneumonia ([facility]ingle    7. GAD (generalized anxiety disorder)    8. Closed fracture of one rib of left side with routine healing, subsequent encounter  -     gabapentin (NEURONTIN) 100 mg capsule; Take 1 Cap by mouth three (3) times daily.    Other orders  -     FLOVENT HFA 220 mcg/actuation inhaler; Take 1 Puff by inhalation two (2) times a day.        NSAID as directed PRN basis, gabapentin, patient tried lidocaine patch/diclofenac gel, not much of any help,    Continue fluoxetine/Xanax as directed.  PRN basis,    Reassurance, rigid splint,    Continue Toprol-XL/Lipitor as directed.    Continue Flovent, eosinophilic pneumonia in remission, advised the patient to [place] any [place] of symptoms.  Start low-dose of gabapentin, discussed the risk and benefits, and side effects of the medication including sedation, blurred vision, confusion,      [email or web address] Kos MD

## 2018-02-18 NOTE — Progress Notes (Signed)
[facility]  Campbell Riches, M.D.  Sudhirkumar C. Posey Pronto, M.D.  [address]  [place], [place]  Ph No:  [phone]  Fax:  [phone]      Chief Complaint   Patient presents with   ??? Follow Up Chronic Condition   ??? Fall       History of Present Illness:  Suzanne Pham is a 67 y.o. 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    Hyperlipidemia Lipitor, no myalgia noted.    Patient on B12 injection, given the prescription for the same,     Denies any GAD/depressed symptoms, tolerating fluoxetine/Xanax and PRN basis,    Patient on vitamin D replacement at this time.    Allergies   Allergen Reactions   ??? Aspirin Other (comments)     "Flu-like symptoms"   ??? Cymbalta [Duloxetine] Other (comments)     pain   ??? Elavil Other (comments)     pain   ??? Forteo [Teriparatide] Other (comments)   ??? Lyrica [Pregabalin] Other (comments)     Hallucinations and falling   ??? Prolia [Denosumab] Rash   ??? Topamax [Topiramate] Other (comments)     Slurred speach   ??? Ultram [Tramadol] Drowsiness     Past Medical History:   Diagnosis Date   ??? Aorta disorder (HCC)     TYPE B   ??? Arthritis    ??? Asthma    ??? Chronic pain     Back   ??? Heart disease    ??? Osteoporosis    ??? Rib fractures     2009   ??? Vitamin B 12 deficiency    ??? Vitamin D deficiency      Past Surgical History:   Procedure Laterality Date   ??? HX BUNIONECTOMY     ??? HX TONSIL AND ADENOIDECTOMY       Family History    Problem Relation Age of Onset   ??? Cancer Mother    ??? Cancer Father    ??? Cancer Brother      Social History     Socioeconomic History   ??? Marital status: MARRIED     Spouse name: Not on file   ??? Number of children: Not on file   ??? Years of education: Not on file   ??? Highest education level: Not on file   Occupational History   ??? Not on file   Social Needs   ??? Financial resource strain: Not on file   ??? Food insecurity:     Worry: Not on file     Inability: Not on file   ??? Transportation needs:     Medical: Not on file     Non-medical: Not on file   Tobacco Use   ??? Smoking status: Never Smoker   ??? Smokeless tobacco: Never Used   Substance and Sexual Activity   ??? Alcohol use: No   ??? Drug use: No   ??? Sexual activity: Yes  Partners: Male     Birth control/protection: None   Lifestyle   ??? Physical activity:     Days per week: Not on file     Minutes per session: Not on file   ??? Stress: Not on file   Relationships   ??? Social connections:     Talks on phone: Not on file     Gets together: Not on file     Attends religious service: Not on file     Active member of club or organization: Not on file     Attends meetings of clubs or organizations: Not on file     Relationship status: Not on file   ??? Intimate partner violence:     Fear of current or ex partner: Not on file     Emotionally abused: Not on file     Physically abused: Not on file     Forced sexual activity: Not on file   Other Topics Concern   ??? Not on file   Social History Narrative   ??? Not on file     Current Outpatient Medications   Medication Sig Dispense Refill   ??? FLOVENT HFA 220 mcg/actuation inhaler Take 1 Puff by inhalation two (2) times a day. 1 Inhaler 11   ??? Syringe with Needle, Disp, (BD ECLIPSE LUER-LOK) 3 mL 25 x 5/8" syrg Inject 17m of B12 every 30 days IM 50 Syringe 2   ??? cyanocobalamin, vitamin B-12, 1,000 mcg/mL kit 1 mL by Injection route every month. 10 Kit 2   ??? gabapentin (NEURONTIN) 100 mg capsule Take 1 Cap by mouth three (3)  times daily. 90 Cap 2   ??? atorvastatin (LIPITOR) 10 mg tablet Take 1 Tab by mouth daily. 90 Tab 4   ??? ALPRAZolam (XANAX) 1 mg tablet Take 0.5 Tabs by mouth two (2) times a day. Max Daily Amount: 1 mg. Indications: anxious 30 Tab 3   ??? FLUoxetine (PROZAC) 40 mg capsule Take 1 Cap by mouth daily. 90 Cap 2   ??? metoprolol succinate (TOPROL XL) 50 mg XL tablet Take 50 mg by mouth daily. Pt takes 1/2 tab every day.     ??? ergocalciferol (ERGOCALCIFEROL) 50,000 unit capsule Take 1 Cap by mouth every seven (7) days. 12 Cap 12   ??? PROAIR HFA 90 mcg/actuation inhaler INHALE 2 INHALATIONS INTO THE LUNGS EVERY 6 (SIX) HOURS AS NEEDED.  11   ??? cpap machine kit by Does Not Apply route.           REVIEW OF SYSTEMS:    GENERAL: negative for - chills, fatigue, fever, hot flashes, malaise, night sweats, sleep disturbance, weight gain or weight loss.    EYES: negative for  - blurred vision, double vision, photophobia, pain, discharge and redness.    ENT AND MOUTH: negative for - epistaxis, headaches, hearing change, nasal   discharge, nasal polyps, oral lesions, facial pain,, sneezing, sore throat, tinnitus, vertigo, visual changes or vocal changes.    CARDIOVASCULAR: negative for - chest pain, dyspnea on exertion, pedal edema, irregular heartbeat, loss of consciousness,  orthopnea, palpitations, paroxysmal nocturnal dyspnea, resting shortness of breath.    RESPIRATORY:  negative for - cough wheezing, hemoptysis, orthopnea, pleuritic pain, shortness of breath, sputum changes,  tachypnea     GASTROINTESTINAL: negative for - abdominal pain, appetite loss, blood in stools, change in bowel habits, change in stools, constipation, diarrhea, gas/bloating, heartburn, hematemesis, melena, nausea/vomiting, stool incontinence or swallowing difficulty    GENITOURINARY FEMALE:  negative for -change in menstrual cycle, change in urinary stream, dysmenorrhea, dyspareunia, dysuria, genital discharge,  genital ulcers, hematuria, incontinence, irregular/heavy menses, pelvic pain, urinary frequency/urgency and decrease in libido     NEUROLOGICAL: negative for - behavioral changes, bowel and bladder control changes, confusion, dizziness, gait disturbance, headaches, sensory changes, motor weakness, seizures, speech problems, tremors, visual changes or weakness    PSYCHIATRIC: negative for - anxiety, behavioral disorder, concentration difficulties, , depressive symptoms, disorientation, hallucinations, delusions, irritability, memory difficulties, mood swings, obsessive thoughts, physical abuse, sleep disturbances or suicidal ideation.    MUSCULOSKELETAL: Positive symptoms as above    INTEGUMENTARY (BREASTS): negative for - a breast mass, a palpable lump in the breast, breast pain, nipple discharge     ENDOCRINE: negative for polydipsia, polyuria, polydypsia, cold-heat intolerance,  hot flashes,    HEM/LYMPH: negative for anemia, bruising, bleeding, lymph node enlargement,     ALLERGY/IMMUNOLOGIC: negative for allergies to medicine, food, dyes, hepatitis,    PHYSICAL EXAM  General appearance - alert, well appearing, and in no distress    Mental status - alert, oriented to person, place, and time    Eyes - pupils equal and reactive, extraocular eye movements intact    Ears - bilateral TM's and external ear canals normal    Nose - normal and patent, no erythema, discharge or polyps    Neck - supple, no significant adenopathy, carotids upstroke normal bilaterally, no bruits, thyroid exam: thyroid is normal in size without nodules or tenderness    Throat / Mouth - no erythema, no tonsillar exudate, normal dental hygiene, no ulcers    Chest - clear to auscultation, no wheezes, rales or rhonchi, symmetric air entry, positive rib tenderness,    Heart - normal rate and regular rhythm, S1 and S2 normal, no gallops noted, no murmur    Abdomen - soft, nontender, nondistended, no masses or organomegaly     Back exam - full range of motion, no tenderness, palpable spasm or pain.    Neurological - alert, oriented, normal speech, no focal findings or movement disorder noted    Musculoskeletal -examination of the right ankle, tenderness anterior ligament, mild swelling, nonspecific superficial skin abrasion/soft tissue swelling, lateral malleolar tenderness, no ankle effusion, no ankle tenderness,    Extremities - peripheral pulses normal, no pedal edema, no clubbing or cyanosis    Skin - normal coloration and turgor, no rashes, no suspicious skin lesions noted    Immunization History   Administered Date(s) Administered   ??? Hep B Vaccine 10/18/1993   ??? Influenza Vaccine 10/18/2009, 04/09/2014, 03/22/2015, 04/09/2016   ??? Influenza Vaccine (Quad) PF 04/09/2017   ??? Influenza Vaccine PF 06/05/2011, 04/03/2012   ??? Pneumococcal Conjugate (PCV-13) 02/13/2017   ??? Pneumococcal Polysaccharide (PPSV-23) 07/10/2010, 05/02/2014, 10/15/2014   ??? Tdap 10/19/2007, 10/15/2014          LABS:   Results for orders placed or performed in visit on 11/12/17   LIPID PANEL   Result Value Ref Range    Cholesterol, total 130 100 - 199 mg/dL    Triglyceride 156 (H) 0 - 149 mg/dL    HDL Cholesterol 44 >39 mg/dL    VLDL, calculated 31 5 - 40 mg/dL    LDL, calculated 55 0 - 99 mg/dL   METABOLIC PANEL, COMPREHENSIVE   Result Value Ref Range    Glucose 84 65 - 99 mg/dL    BUN 16 8 - 27 mg/dL    Creatinine 0.76 0.57 - 1.00 mg/dL  GFR est non-AA 83 >59 mL/min/1.73    GFR est AA 95 >59 mL/min/1.73    BUN/Creatinine ratio 21 12 - 28    Sodium 142 134 - 144 mmol/L    Potassium 4.2 3.5 - 5.2 mmol/L    Chloride 107 (H) 96 - 106 mmol/L    CO2 24 20 - 29 mmol/L    Calcium 9.6 8.7 - 10.3 mg/dL    Protein, total 5.9 (L) 6.0 - 8.5 g/dL    Albumin 4.0 3.6 - 4.8 g/dL    GLOBULIN, TOTAL 1.9 1.5 - 4.5 g/dL    A-G Ratio 2.1 1.2 - 2.2    Bilirubin, total 0.4 0.0 - 1.2 mg/dL    Alk. phosphatase 79 39 - 117 IU/L    AST (SGOT) 14 0 - 40 IU/L    ALT (SGPT) 15 0 - 32 IU/L    TSH 3RD GENERATION   Result Value Ref Range    TSH 2.070 0.450 - 4.500 uIU/mL   VITAMIN D, 25 HYDROXY   Result Value Ref Range    VITAMIN D, 25-HYDROXY 48.5 30.0 - 100.0 ng/mL   HEPATITIS C AB   Result Value Ref Range    Hep C Virus Ab <0.1 0.0 - 0.9 s/co ratio   AMB POC COMPLETE CBC,AUTOMATED ENTER   Result Value Ref Range    WBC (POC) 6.8 4.5 - 10.5 10^3/ul    LYMPHOCYTES (POC) 37.0 20.5 - 51.1 %    MONOCYTES (POC) 5.4 1.7 - 9.3 %    GRANULOCYTES (POC) 57.6 42.2 - 75.2 %    ABS. LYMPHS (POC) 2.5 1.2 - 3.4 10^3/ul    ABS. MONOS (POC) 0.4 0.1 - 0.6 10^3/ul    ABS. GRANS (POC) 3.9 1.4 - 6.5 10^3/ul    RBC (POC) 4.40 4 - 6 10^6/ul    HGB (POC) 13.4 11 - 18 g/dL    HCT (POC) 39.7 35 - 60 %    MCV (POC) 90.3 80 - 99.9 fL    MCH (POC) 30.6 27 - 31 pg    MCHC (POC) 33.9 33 - 37 g/dL    RDW (POC) 13.8 (A) 11.6 - 13.7 %    PLATELET (POC) 228 150 - 450 10^3/ul    MPV (POC) 9.4 7.8 - 11 fL   AMB POC URINALYSIS DIP STICK AUTO W/O MICRO   Result Value Ref Range    Color (UA POC) Yellow     Clarity (UA POC) Clear     Glucose (UA POC) Negative Negative mg/dL    Bilirubin (UA POC) Negative Negative    Ketones (UA POC) Negative Negative    Specific gravity (UA POC) 1.015 1.001 - 1.035    Blood (UA POC) Negative Negative    pH (UA POC) 5.5 4.6 - 8.0    Protein (UA POC) Negative Negative    Urobilinogen (UA POC) 0.2 mg/dL 0.2 - 1    Nitrites (UA POC) Negative Negative    Leukocyte esterase (UA POC) Negative Negative   AMB POC FECAL BLOOD, OCCULT, QL 3 CARDS   Result Value Ref Range    VALID INTERNAL CONTROL POC Yes     Hemoccult (POC) Negative Negative    Occult Blood-2 (POC) Negative     Occult blood-3 (POC) Negative          ASSESSMENT AND PLAN  Diagnoses and all orders for this visit:    1. Closed fracture of right ankle with routine healing, subsequent encounter  -  gabapentin (NEURONTIN) 100 mg capsule; Take 1 Cap by mouth three (3) times daily.    2. Vitamin B 12 deficiency   -     Syringe with Needle, Disp, (BD ECLIPSE LUER-LOK) 3 mL 25 x 5/8" syrg; Inject 64m of B12 every 30 days IM  -     cyanocobalamin, vitamin B-12, 1,000 mcg/mL kit; 1 mL by Injection route every month.    3. BMI 32.0-32.9,adult    4. Vitamin D deficiency    5. Pure hypercholesterolemia    6. Chronic eosinophilic pneumonia ([place]    7. GAD (generalized anxiety disorder)    8. Closed fracture of one rib of left side with routine healing, subsequent encounter  -     gabapentin (NEURONTIN) 100 mg capsule; Take 1 Cap by mouth three (3) times daily.    Other orders  -     FLOVENT HFA 220 mcg/actuation inhaler; Take 1 Puff by inhalation two (2) times a day.        NSAID as directed PRN basis, gabapentin, patient tried lidocaine patch/diclofenac gel, not much of any help,    Continue fluoxetine/Xanax as directed.  PRN basis,    Reassurance, rigid splint,    Continue Toprol-XL/Lipitor as directed.    Continue Flovent, eosinophilic pneumonia in remission, advised the patient to call [place] any worsening of symptoms.  Start low-dose of gabapentin, discussed the risk and benefits, and side effects of the medication including sedation, blurred vision, confusion,      [id number] Kos MD

## 2018-03-01 ENCOUNTER — Encounter

## 2018-03-02 ENCOUNTER — Emergency Department: Payer: Medicare Other

## 2018-03-02 ENCOUNTER — Other Ambulatory Visit: Payer: Self-pay

## 2018-03-02 ENCOUNTER — Encounter: Payer: Self-pay | Admitting: Emergency Medicine

## 2018-03-02 ENCOUNTER — Observation Stay
Admission: EM | Admit: 2018-03-02 | Discharge: 2018-03-04 | Disposition: A | Discharge status: Home / Self Care | Payer: Medicare Other | Attending: Internal Medicine | Admitting: Internal Medicine

## 2018-03-02 DIAGNOSIS — F329 Major depressive disorder, single episode, unspecified: Secondary | ICD-10-CM | POA: Diagnosis not present

## 2018-03-02 DIAGNOSIS — E785 Hyperlipidemia, unspecified: Secondary | ICD-10-CM | POA: Diagnosis not present

## 2018-03-02 DIAGNOSIS — R7989 Other specified abnormal findings of blood chemistry: Secondary | ICD-10-CM | POA: Diagnosis not present

## 2018-03-02 DIAGNOSIS — Z885 Allergy status to narcotic agent status: Secondary | ICD-10-CM | POA: Diagnosis not present

## 2018-03-02 DIAGNOSIS — M25551 Pain in right hip: Secondary | ICD-10-CM | POA: Insufficient documentation

## 2018-03-02 DIAGNOSIS — J45909 Unspecified asthma, uncomplicated: Secondary | ICD-10-CM | POA: Diagnosis not present

## 2018-03-02 DIAGNOSIS — K5900 Constipation, unspecified: Secondary | ICD-10-CM | POA: Insufficient documentation

## 2018-03-02 DIAGNOSIS — S300XXA Contusion of lower back and pelvis, initial encounter: Secondary | ICD-10-CM | POA: Diagnosis present

## 2018-03-02 DIAGNOSIS — M4856XA Collapsed vertebra, not elsewhere classified, lumbar region, initial encounter for fracture: Secondary | ICD-10-CM | POA: Insufficient documentation

## 2018-03-02 DIAGNOSIS — Z888 Allergy status to other drugs, medicaments and biological substances status: Secondary | ICD-10-CM | POA: Diagnosis not present

## 2018-03-02 DIAGNOSIS — I719 Aortic aneurysm of unspecified site, without rupture: Secondary | ICD-10-CM | POA: Insufficient documentation

## 2018-03-02 DIAGNOSIS — W07XXXA Fall from chair, initial encounter: Secondary | ICD-10-CM | POA: Diagnosis not present

## 2018-03-02 DIAGNOSIS — F419 Anxiety disorder, unspecified: Secondary | ICD-10-CM | POA: Diagnosis not present

## 2018-03-02 DIAGNOSIS — F32A Depression, unspecified: Secondary | ICD-10-CM | POA: Diagnosis present

## 2018-03-02 DIAGNOSIS — I71019 Dissection of thoracic aorta, unspecified: Secondary | ICD-10-CM | POA: Diagnosis present

## 2018-03-02 DIAGNOSIS — I071 Rheumatic tricuspid insufficiency: Secondary | ICD-10-CM | POA: Diagnosis not present

## 2018-03-02 DIAGNOSIS — K219 Gastro-esophageal reflux disease without esophagitis: Secondary | ICD-10-CM | POA: Diagnosis not present

## 2018-03-02 DIAGNOSIS — M1990 Unspecified osteoarthritis, unspecified site: Secondary | ICD-10-CM | POA: Diagnosis not present

## 2018-03-02 DIAGNOSIS — M5136 Other intervertebral disc degeneration, lumbar region: Secondary | ICD-10-CM | POA: Insufficient documentation

## 2018-03-02 DIAGNOSIS — Z886 Allergy status to analgesic agent status: Secondary | ICD-10-CM | POA: Insufficient documentation

## 2018-03-02 DIAGNOSIS — I2510 Atherosclerotic heart disease of native coronary artery without angina pectoris: Secondary | ICD-10-CM | POA: Diagnosis not present

## 2018-03-02 DIAGNOSIS — R778 Other specified abnormalities of plasma proteins: Secondary | ICD-10-CM | POA: Diagnosis present

## 2018-03-02 DIAGNOSIS — R0902 Hypoxemia: Secondary | ICD-10-CM

## 2018-03-02 DIAGNOSIS — M545 Low back pain: Secondary | ICD-10-CM | POA: Diagnosis not present

## 2018-03-02 DIAGNOSIS — G8929 Other chronic pain: Secondary | ICD-10-CM | POA: Insufficient documentation

## 2018-03-02 DIAGNOSIS — M25552 Pain in left hip: Secondary | ICD-10-CM | POA: Diagnosis not present

## 2018-03-02 DIAGNOSIS — Y92009 Unspecified place in unspecified non-institutional (private) residence as the place of occurrence of the external cause: Secondary | ICD-10-CM | POA: Insufficient documentation

## 2018-03-02 DIAGNOSIS — I7101 Dissection of thoracic aorta: Secondary | ICD-10-CM | POA: Diagnosis not present

## 2018-03-02 DIAGNOSIS — Z79899 Other long term (current) drug therapy: Secondary | ICD-10-CM | POA: Insufficient documentation

## 2018-03-02 DIAGNOSIS — W19XXXA Unspecified fall, initial encounter: Secondary | ICD-10-CM

## 2018-03-02 HISTORY — DX: Pulmonary eosinophilia, not elsewhere classified: J82

## 2018-03-02 HISTORY — DX: Major depressive disorder, single episode, unspecified: F32.9

## 2018-03-02 HISTORY — DX: Unspecified osteoarthritis, unspecified site: M19.90

## 2018-03-02 HISTORY — DX: Anxiety disorder, unspecified: F41.9

## 2018-03-02 HISTORY — DX: Depression, unspecified: F32.A

## 2018-03-02 HISTORY — DX: Aortic aneurysm of unspecified site, without rupture: I71.9

## 2018-03-02 HISTORY — DX: Chronic eosinophilic pneumonia: J82.81

## 2018-03-02 HISTORY — DX: Unspecified asthma, uncomplicated: J45.909

## 2018-03-02 LAB — URINALYSIS, COMPLETE (UACMP) WITH MICROSCOPIC
Bacteria, UA: NONE SEEN
Bilirubin Urine: NEGATIVE
Glucose, UA: NEGATIVE mg/dL
Hgb urine dipstick: NEGATIVE
Ketones, ur: NEGATIVE mg/dL
Nitrite: NEGATIVE
PH: 5 (ref 5.0–8.0)
Protein, ur: NEGATIVE mg/dL
SPECIFIC GRAVITY, URINE: 1.019 (ref 1.005–1.030)

## 2018-03-02 LAB — CBC WITH DIFFERENTIAL/PLATELET
BASOS ABS: 0 10*3/uL (ref 0–0.1)
BASOS PCT: 0 %
EOS ABS: 0.2 10*3/uL (ref 0–0.7)
Eosinophils Relative: 2 %
HEMATOCRIT: 36.4 % (ref 35.0–47.0)
HEMOGLOBIN: 12.5 g/dL (ref 12.0–16.0)
Lymphocytes Relative: 32 %
Lymphs Abs: 2.9 10*3/uL (ref 1.0–3.6)
MCH: 30.9 pg (ref 26.0–34.0)
MCHC: 34.4 g/dL (ref 32.0–36.0)
MCV: 89.9 fL (ref 80.0–100.0)
Monocytes Absolute: 0.6 10*3/uL (ref 0.2–0.9)
Monocytes Relative: 7 %
Neutro Abs: 5.3 10*3/uL (ref 1.4–6.5)
Neutrophils Relative %: 59 %
Platelets: 203 10*3/uL (ref 150–440)
RBC: 4.05 MIL/uL (ref 3.80–5.20)
RDW: 13.7 % (ref 11.5–14.5)
WBC: 9 10*3/uL (ref 3.6–11.0)

## 2018-03-02 LAB — TROPONIN I: TROPONIN I: 0.08 ng/mL — AB (ref <0.03)

## 2018-03-02 LAB — COMPREHENSIVE METABOLIC PANEL
ALK PHOS: 71 U/L (ref 38–126)
ALT: 19 U/L (ref 0–44)
ANION GAP: 4 — AB (ref 5–15)
AST: 22 U/L (ref 15–41)
Albumin: 3.6 g/dL (ref 3.5–5.0)
BILIRUBIN TOTAL: 0.3 mg/dL (ref 0.3–1.2)
BUN: 25 mg/dL — ABNORMAL HIGH (ref 8–23)
CALCIUM: 8.5 mg/dL — AB (ref 8.9–10.3)
CO2: 27 mmol/L (ref 22–32)
CREATININE: 0.7 mg/dL (ref 0.44–1.00)
Chloride: 110 mmol/L (ref 98–111)
Glucose, Bld: 113 mg/dL — ABNORMAL HIGH (ref 70–99)
Potassium: 3.7 mmol/L (ref 3.5–5.1)
Sodium: 141 mmol/L (ref 135–145)
TOTAL PROTEIN: 6 g/dL — AB (ref 6.5–8.1)

## 2018-03-02 LAB — CK: CK TOTAL: 33 U/L — AB (ref 38–234)

## 2018-03-02 IMAGING — CR DG LUMBAR SPINE 2-3V
1 series · 3 of 3 positions shown · non-contrast
Comparison: None.

CLINICAL DATA: Fall.  Low back pain.

EXAM:
LUMBAR SPINE - 2-3 VIEW

[Series 1: dg lumbar spine 2-3 views · 0.14mm/px · 3 of 3 slices shown]
[im 1/3]
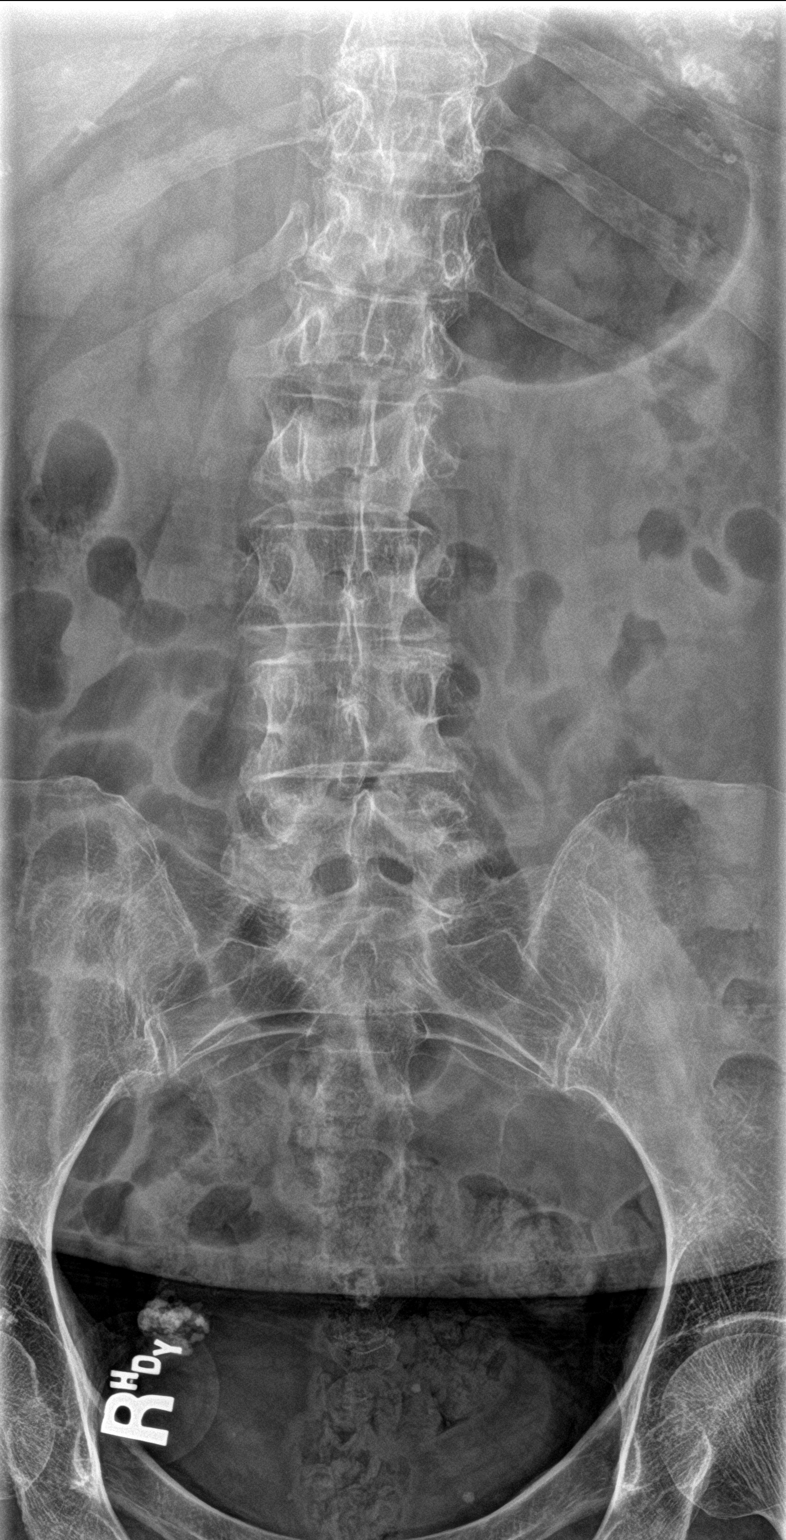
[im 2/3]
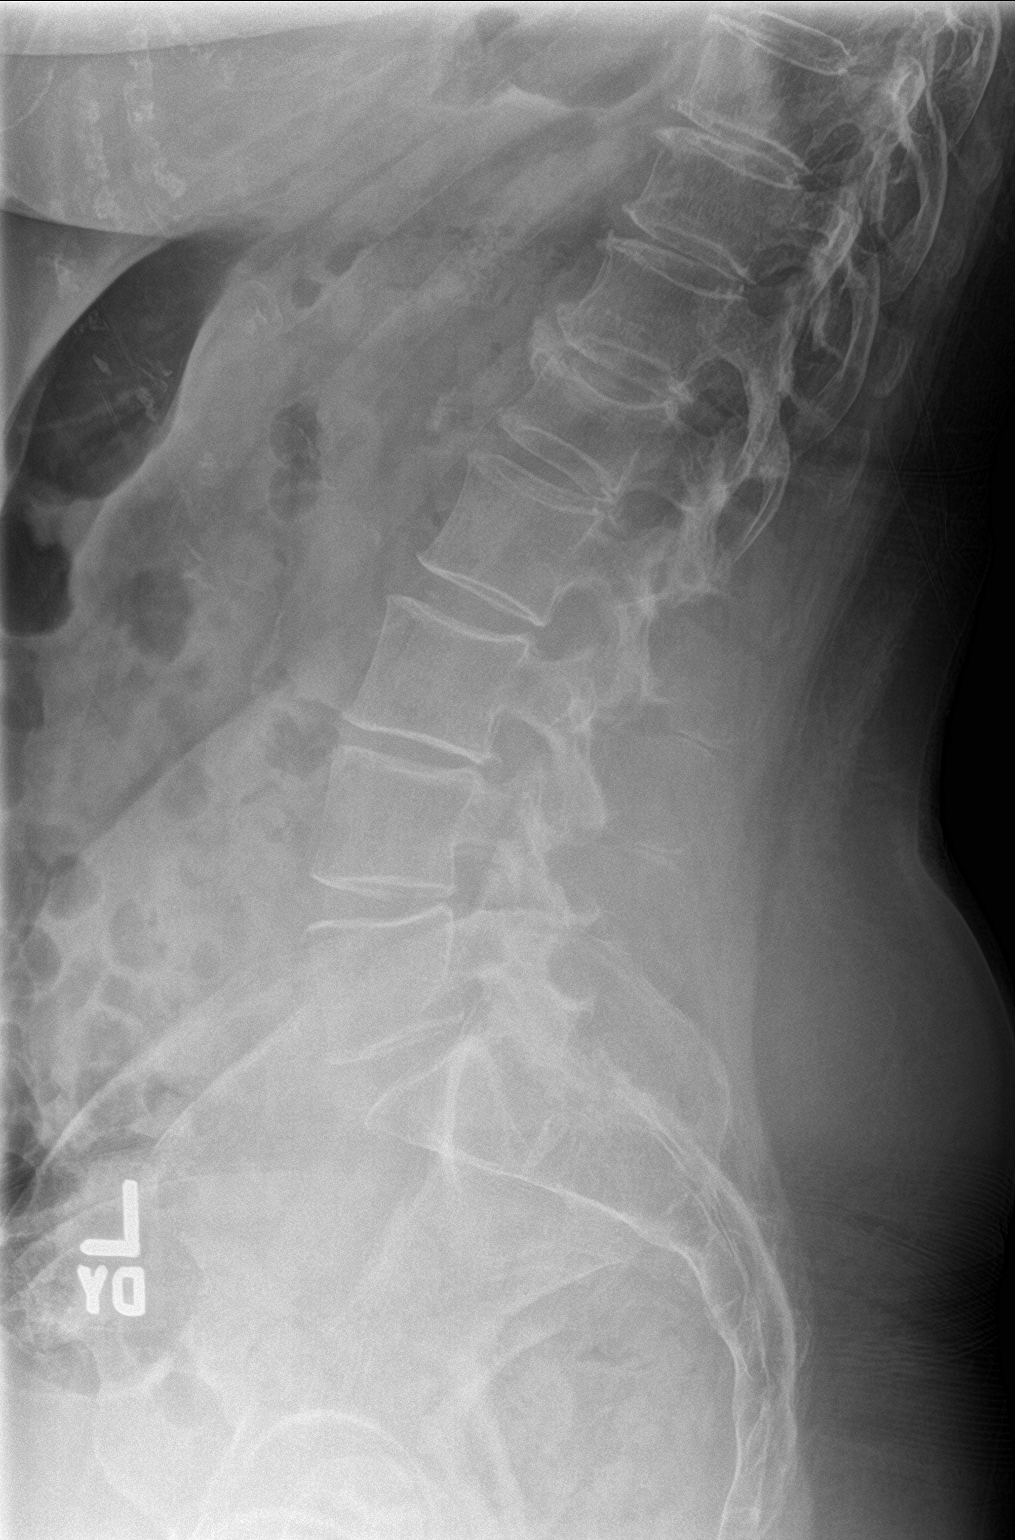
[im 3/3]
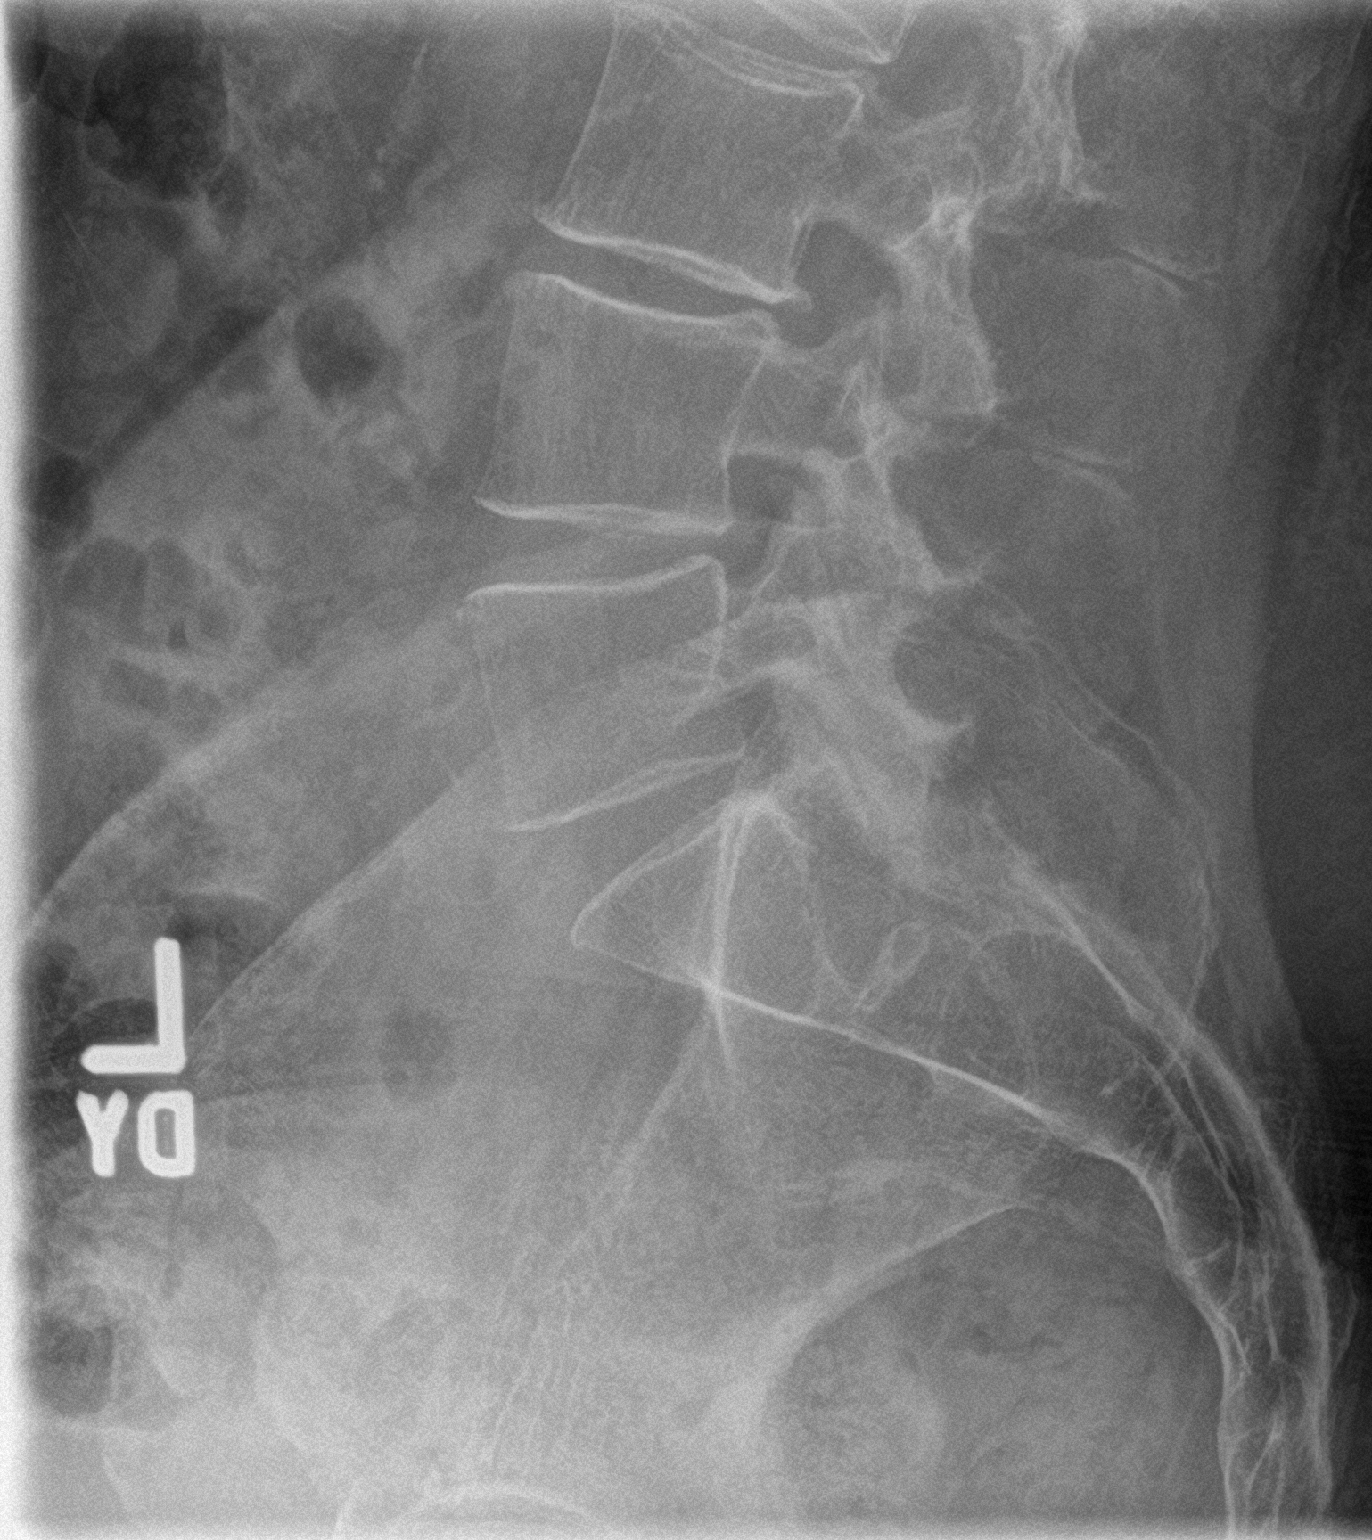

[3 of 3 positions shown; findings below may reference images not displayed]

FINDINGS: This report assumes 5 non rib-bearing lumbar vertebrae.

There is a mild-to-moderate superior L1 vertebral compression
fracture of indeterminate chronicity with approximately 30% loss of
vertebral body height. Otherwise preserved lumbar vertebral body
heights.

Mild-to-moderate multilevel lumbar degenerative disc disease, most
prominent at T12-L1. No spondylolisthesis. Mild lower lumbar facet
arthropathy. No aggressive appearing focal osseous lesions.
IMPRESSION: 1. Mild-to-moderate superior L1 vertebral compression fracture of
indeterminate chronicity.
2. No spondylolisthesis.
3. Mild-to-moderate multilevel lumbar degenerative changes as
detailed.

## 2018-03-02 IMAGING — DX DG PELVIS 1-2V
1 series · 1 of 1 positions shown · non-contrast
Comparison: None.

CLINICAL DATA: Fall with pain.

EXAM:
PELVIS - 1-2 VIEW

[pelvis ap]
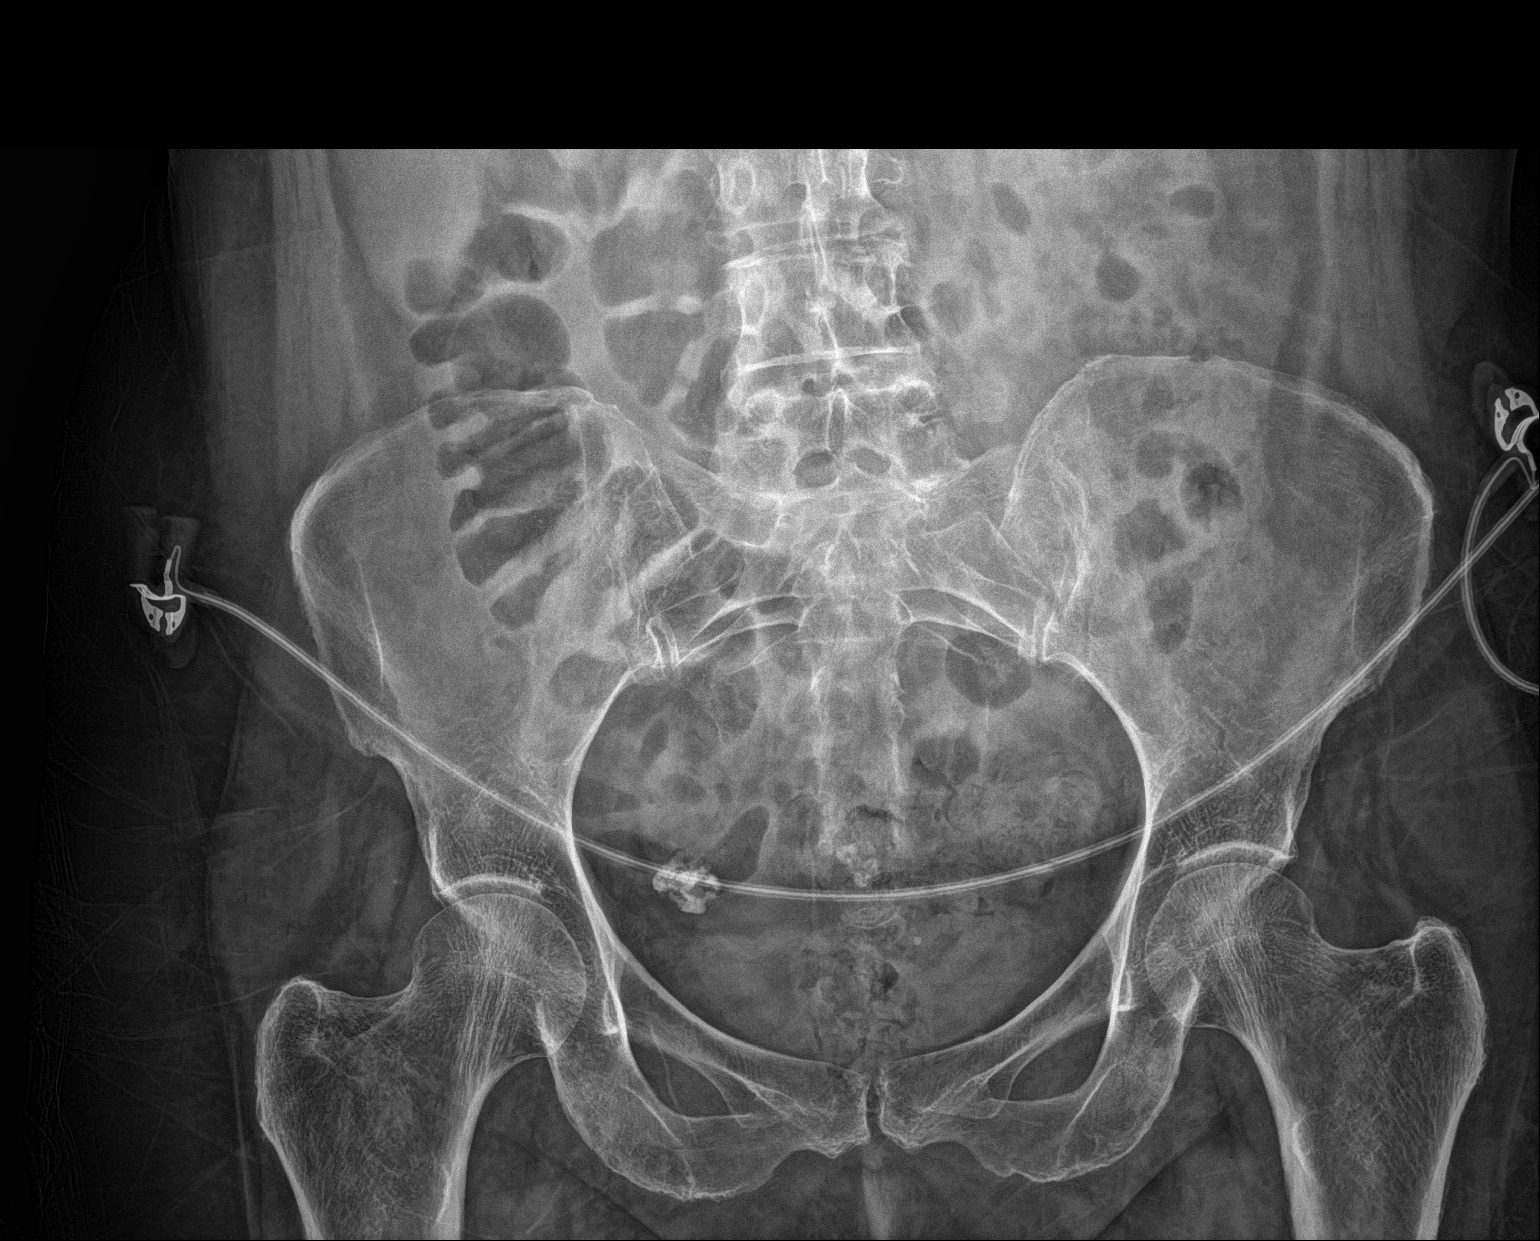

[1 of 1 positions shown; findings below may reference images not displayed]

FINDINGS: A calcification in the right-sided pelvis could represent a
calcified fibroid or ovarian calcification. Soft tissues otherwise
normal. No fractures identified.
IMPRESSION: No fractures identified.

## 2018-03-02 MED ORDER — CYCLOBENZAPRINE HCL 10 MG PO TABS: 10.0000 mg | ORAL_TABLET | Freq: Three times a day (TID) | ORAL | 0 refills | Status: DC | PRN

## 2018-03-02 MED ORDER — IBUPROFEN 600 MG PO TABS: 600.0000 mg | ORAL_TABLET | Freq: Three times a day (TID) | ORAL | 0 refills | Status: AC | PRN

## 2018-03-02 MED ORDER — IBUPROFEN 800 MG PO TABS: 800.0000 mg | ORAL_TABLET | Freq: Once | ORAL | Status: DC

## 2018-03-02 MED ORDER — SODIUM CHLORIDE 0.9 % IV BOLUS
1000.0000 mL | Freq: Once | INTRAVENOUS | Status: AC
  Administered 2018-03-02: 1000 mL via INTRAVENOUS

## 2018-03-02 MED ORDER — CYCLOBENZAPRINE HCL 10 MG PO TABS: 10.0000 mg | ORAL_TABLET | Freq: Once | ORAL | Status: DC

## 2018-03-02 MED ORDER — OXYCODONE-ACETAMINOPHEN 5-325 MG PO TABS: 1.0000 | ORAL_TABLET | Freq: Once | ORAL | Status: DC

## 2018-03-02 MED ORDER — OXYCODONE-ACETAMINOPHEN 7.5-325 MG PO TABS: 1.0000 | ORAL_TABLET | Freq: Four times a day (QID) | ORAL | 0 refills | Status: DC | PRN

## 2018-03-02 MED ORDER — ORPHENADRINE CITRATE 30 MG/ML IJ SOLN
60.0000 mg | Freq: Two times a day (BID) | INTRAMUSCULAR | Status: DC
  Administered 2018-03-02: 60 mg via INTRAVENOUS
  Filled 2018-03-02: qty 2

## 2018-03-02 NOTE — ED Notes (Signed)
X-ray at bedside

## 2018-03-02 NOTE — ED Provider Notes (Signed)
Patient brought to room 6 for borderline blood pressures.  I suspect this is related to analgesics she was given.  We will give IV fluids, she does describe some mild abdominal pain but she is not sure if this might be "hunger pains ".  She would like to eat, will hold off on CT at this time.  Will order pelvis x-ray   ED ECG REPORT I, Lavonia Drafts, the attending physician, personally viewed and interpreted this ECG.  Date: 03/02/2018  Rhythm: normal sinus rhythm QRS Axis: normal Intervals: normal ST/T Wave abnormalities: Inverted T waves in V2 and V3, unclear if this is chronic   ----------------------------------------- 10:41 PM on 03/02/2018 -----------------------------------------  Patient's troponin is mildly elevated, she denies chest pain or dizziness.  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  Do not feel CT angios necessary at this time given that recent results.  However given elevated troponin will admit to the hospital service for trending, further evaluation     Lavonia Drafts, MD 03/02/18 2241

## 2018-03-02 NOTE — ED Provider Notes (Signed)
[facility] Emergency Department Provider Note   ____________________________________________   First MD Initiated Contact with Patient 03/02/18 1622     (approximate)  I have reviewed the triage vital signs and the nursing notes.   HISTORY  Chief Complaint Fall    HPI Dorothy Callahan is a 67 y.o. female patient complain of low back and bilateral hip pain secondary to fall.  Patient states she was sitting a chair in the living room when he took back was comfortable laying on her buttocks.  Patient did pain increased with attempting to sit upright.  Patient denies bladder or bowel dysfunction.  Patient denies radicular component to her back pain.  Patient state pain is 5/10.  Patient given 100 mcg of fentanyl by EMS prior to arrival.  Past Medical History:  Diagnosis Date  . Anxiety   . Arthritis   . Asthma   . Depression   . 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 Eosinophilic pneumonia ([place])     There are no active problems to display for this patient.   Past Surgical History:  Procedure Laterality Date  . ENDOMETRIAL ABLATION    . TONSILLECTOMY      Prior to Admission medications   Medication Sig Start Date End Date Taking? Authorizing Provider  cyclobenzaprine (FLEXERIL) 10 MG tablet Take 1 tablet (10 mg total) by mouth 3 (three) times daily as needed. 03/02/18   Sable Feil, PA-C  ibuprofen (ADVIL,MOTRIN) 600 MG tablet Take 1 tablet (600 mg total) by mouth every 8 (eight) hours as needed. 03/02/18   Sable Feil, PA-C  oxyCODONE-acetaminophen (PERCOCET) 7.5-325 MG tablet Take 1 tablet by mouth every 6 (six) hours as needed for severe pain. 03/02/18   Sable Feil, PA-C    Allergies Aspirin  History reviewed. No pertinent family history.  Social History Social History   Tobacco Use  . Smoking status: Never Smoker  . Smokeless tobacco: Never Used  Substance Use Topics  . Alcohol use: Never    Frequency: Never  . Drug  use: Never    Review of Systems Constitutional: No fever/chills Eyes: No visual changes. ENT: No sore throat. Cardiovascular: Denies chest pain. Respiratory: Denies shortness of breath. Gastrointestinal: No abdominal pain.  No nausea, no vomiting.  No diarrhea.  No constipation. Genitourinary: Negative for dysuria. Musculoskeletal: Back and bilateral hip pain. Skin: Negative for rash. Neurological: Negative for headaches, focal weakness or numbness. Psychiatric:Anxiety depression. Allergic/Immunilogical: Aspirin. ____________________________________________   PHYSICAL EXAM:  VITAL SIGNS: ED Triage Vitals  Enc Vitals Group     BP 03/02/18 1623 (!) 127/58     Pulse Rate 03/02/18 1623 63     Resp 03/02/18 1623 12     Temp 03/02/18 1623 97.7 F (36.5 C)     Temp Source 03/02/18 1623 Oral     SpO2 03/02/18 1623 (S) (!) 85 %     Weight 03/02/18 1624 175 lb (79.4 kg)     Height 03/02/18 1624 5\' 4"  (1.626 m)     Head Circumference --      Peak Flow --      Pain Score 03/02/18 1624 5     Pain Loc --      Pain Edu? --      Excl. in [place]? --    Constitutional: Alert and oriented. Well appearing and in no acute distress. Eyes: Conjunctivae are normal. PERRL. EOMI. Head: Atraumatic. Nose: No congestion/rhinnorhea. Mouth/Throat: Mucous membranes are moist.  Oropharynx non-erythematous.  Neck: No stridor.  No cervical spine tenderness to palpation. Cardiovascular: Normal rate, regular rhythm. Grossly normal heart sounds.  Good peripheral circulation. Respiratory: Normal respiratory effort.  No retractions. Lungs CTAB. Gastrointestinal: Soft and nontender. No distention. No abdominal bruits. No CVA tenderness. Genitourinary: Deferred Musculoskeletal: No obvious spinal deformity.  Patient has moderate guarding palpation of L4-S1.  Patient had negative straight leg test.  No lower extremity tenderness nor edema.  No joint effusions. Neurologic:  Normal speech and language. No gross  focal neurologic deficits are appreciated. No gait instability. Skin:  Skin is warm, dry and intact. No rash noted. Psychiatric: Mood and affect are normal. Speech and behavior are normal.  ____________________________________________   LABS (all labs ordered are listed, but only abnormal results are displayed)  Labs Reviewed - No data to display ____________________________________________  EKG   ____________________________________________  RADIOLOGY  ED MD interpretation:    Official radiology report(s): Dg Lumbar Spine 2-3 Views  Result Date: 03/02/2018 CLINICAL DATA:  Fall.  Low back pain. EXAM: LUMBAR SPINE - 2-3 VIEW COMPARISON:  None. FINDINGS: This report assumes 5 non rib-bearing lumbar vertebrae. There is a mild-to-moderate superior L1 vertebral compression fracture of indeterminate chronicity with approximately 30% loss of vertebral body height. Otherwise preserved lumbar vertebral body heights. Mild-to-moderate multilevel lumbar degenerative disc disease, most prominent at T12-L1. No spondylolisthesis. Mild lower lumbar facet arthropathy. No aggressive appearing focal osseous lesions. IMPRESSION: 1. Mild-to-moderate superior L1 vertebral compression fracture of indeterminate chronicity. 2. No spondylolisthesis. 3. Mild-to-moderate multilevel lumbar degenerative changes as detailed. Electronically Signed   By: Ilona Sorrel M.D.   On: 03/02/2018 17:15    ____________________________________________   PROCEDURES  Procedure(s) performed: None  Procedures  Critical Care performed: No  ____________________________________________   INITIAL IMPRESSION / ASSESSMENT AND PLAN / ED COURSE  As part of my medical decision making, I reviewed the following data within the electronic MEDICAL RECORD NUMBER    Pain secondary to coccyx contusion.  Discussed x-ray findings with patient.  Patient given discharge care instruction advised take medication as directed.  Patient advised  follow-up PCP.      ____________________________________________   FINAL CLINICAL IMPRESSION(S) / ED DIAGNOSES  Final diagnoses:  Contusion of coccyx, initial encounter  Fall, initial encounter     ED Discharge Orders         Ordered    oxyCODONE-acetaminophen (PERCOCET) 7.5-325 MG tablet  Every 6 hours PRN     03/02/18 1725    cyclobenzaprine (FLEXERIL) 10 MG tablet  3 times daily PRN     03/02/18 1726    ibuprofen (ADVIL,MOTRIN) 600 MG tablet  Every 8 hours PRN     03/02/18 1726           Note:  This document was prepared using Dragon voice recognition software and may include unintentional dictation errors.    Sable Feil, PA-C 03/02/18 [place], [place], MD 03/07/18 1434

## 2018-03-02 NOTE — ED Provider Notes (Signed)
-----------------------------------------   9:10 PM on 03/02/2018 -----------------------------------------   Blood pressure (!) 92/54, pulse 65, temperature 97.7 F (36.5 C), temperature source Oral, resp. rate 18, height 5\' 4"  (1.626 m), weight 79.4 kg, SpO2 93 %.  Assuming care from Randel Pigg, PA-C..  In short, Dorothy Callahan is a 67 y.o. female with a chief complaint of Fall .  Refer to the original H&P for additional details.  Patient was initially discharged by provider, as his shift was over prior to patient being signed out, he provider left as per patient had no further complaints and had completed work-up.  On discharge vital signs, blood pressure was soft at 100/40.  Nursing staff presented to me, and I recommended his patient and received both fentanyl as well as Norflex to hold patient until vital signs improved.  Next vital signs showed blood pressure of 92/54 with an O2 saturation of 93%.  I reexamined the patient at this time.  Patient is now complaining of some mild midline abdominal pain in addition to her lower back pain.  On exam, with light palpation of her abdomen midline, patient reported tenderness.  No palpable mass or pulsating mass appreciated.  On auscultation, bowel sounds x4 quadrants, no appreciable bruits.  No distention, ecchymosis of the abdominal wall.  On review of records, patient has a history of dissection which is known.  Given patient's blood pressure, abdominal pain, I feel that patient would be suited with IV, fluids, labs, CT Angio of the chest abdomen and pelvis.  Differential at this time include side effects of medications (fentanyl and Norflex), transient hypotension, chronic hypotension, mild dehydration, leaking dissection.   Given patient's symptoms, history, orders, nursing staff did not feel comfortable keeping the patient in the fast track side of the emergency department without monitors.  As such, patient was transferred to the major side  emergency department.  Patient care transferred to Dr. Corky Downs at this time for further management and final disposition.       Brynda Peon 03/02/18 2207    Lavonia Drafts, MD 03/02/18 2252

## 2018-03-02 NOTE — ED Triage Notes (Signed)
Pt presents to ED via [facility] c/o fall with lower back and bil hip pain. EMS report pt was sitting in chair in living room and tipped over backward, landing on sacrum/buttocks. Pt reports intense pain when attempting to sit upright. Given 131mcg fentanyl PTA by EMS.

## 2018-03-02 NOTE — Discharge Instructions (Signed)
Follow discharge care instruction take medication as directed.  Be advised that pain medication muscle relaxer may cause increased drowsiness.

## 2018-03-02 NOTE — ED Notes (Signed)
ED Provider at bedside. 

## 2018-03-02 NOTE — H&P (Signed)
[facility] NAME: Dorothy Callahan    MR#:  [id number]  DATE OF BIRTH:  [birth year]  DATE OF ADMISSION:  03/02/2018  PRIMARY CARE PHYSICIAN: Hector Brunswick, MD   REQUESTING/REFERRING PHYSICIAN: Corky Downs, MD  CHIEF COMPLAINT:   Chief Complaint  Patient presents with  . Fall    HISTORY OF PRESENT ILLNESS:  Dorothy Callahan  is a 67 y.o. female who presents with chief complaint as above.  She had a fall and came to the ED with lower back pain.  She was given analgesia and muscle relaxer here and had a drop in her blood pressure.  Testing also revealed elevated troponin, the patient does not have a history of elevated troponin in the past.  She does have a known chronic aortic dissection first found in January of this year.  Recent CT imaging shows that it is stable.  On this writer's interview the patient states that she has been having some epigastric discomfort intermittently recently.  She set up an appointment to see a gastroenterologist thinking that this was all GI related.  Hospitalist were called for admission and further evaluation due to her elevated troponin.  PAST MEDICAL HISTORY:   Past Medical History:  Diagnosis Date  . Anxiety   . Arthritis   . Asthma   . Depression   . 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 ENDOMETRIAL ABLATION    . TONSILLECTOMY       SOCIAL HISTORY:   Social History   Tobacco Use  . Smoking status: Never Smoker  . Smokeless tobacco: Never Used  Substance Use Topics  . Alcohol use: Never    Frequency: Never     FAMILY HISTORY:   Family History  Problem Relation Age of Onset  . Aortic aneurysm Father   . Aortic aneurysm Brother      DRUG ALLERGIES:   Allergies  Allergen Reactions  . Aspirin Other (See Comments)    "makes me feel like I have the flu"     MEDICATIONS AT HOME:   Prior to Admission medications   Medication Sig Start Date End Date Taking? Authorizing Provider  albuterol (PROVENTIL HFA;VENTOLIN HFA) 108 (90 Base) MCG/ACT inhaler Inhale into the lungs every 6 (six) hours as needed for wheezing or shortness of breath.   Yes [provider]  ALPRAZolam Duanne Moron) 0.5 MG tablet Take 0.5 mg by mouth 2 (two) times daily as needed for anxiety.   Yes [provider]  atorvastatin (LIPITOR) 10 MG tablet Take 10 mg by mouth daily.   Yes [provider]  cyanocobalamin (,VITAMIN B-12,) 1000 MCG/ML injection Inject 1 mL into the muscle every 30 (thirty) days. 02/18/18  Yes [provider]  FLUoxetine (PROZAC) 20 MG capsule Take 20 mg by mouth daily. 01/11/18  Yes [provider]  FLUoxetine (PROZAC) 40 MG capsule Take 40 mg by mouth daily.   Yes [provider]  gabapentin (NEURONTIN) 100 MG capsule Take 100 mg by mouth 3 (three) times daily.   Yes [provider]  metoprolol succinate (TOPROL-XL) 25 MG 24 hr tablet Take 25 mg by mouth daily.   Yes [provider]  cyclobenzaprine (FLEXERIL) 10 MG tablet Take 1 tablet (10 mg total) by mouth 3 (three) times daily as needed. 03/02/18   Sable Feil, PA-C  ibuprofen (ADVIL,MOTRIN) 600 MG tablet Take 1 tablet (600 mg total) by mouth every 8 (eight) hours as needed. 03/02/18   Sable Feil, PA-C  metoprolol succinate (TOPROL-XL) 50 MG 24 hr tablet Take 50 mg by mouth daily. 12/30/17   [provider]  oxyCODONE-acetaminophen (PERCOCET) 7.5-325 MG tablet Take 1 tablet by mouth every 6 (six) hours as needed for severe pain. 03/02/18   Sable Feil, PA-C    REVIEW OF SYSTEMS:  Review of Systems  Constitutional: Negative for chills, fever, malaise/fatigue and weight loss.  HENT: Negative for ear pain, hearing loss and tinnitus.   Eyes: Negative for blurred vision, double vision, pain and redness.  Respiratory:  Negative for cough, hemoptysis and shortness of breath.   Cardiovascular: Negative for chest pain, palpitations, orthopnea and leg swelling.  Gastrointestinal: Negative for abdominal pain, constipation, diarrhea, nausea and vomiting.       Intermittent epigastric discomfort  Genitourinary: Negative for dysuria, frequency and hematuria.  Musculoskeletal: Positive for back pain and falls. Negative for joint pain and neck pain.  Skin:       No acne, rash, or lesions  Neurological: Negative for dizziness, tremors, focal weakness and weakness.  Endo/Heme/Allergies: Negative for polydipsia. Does not bruise/bleed easily.  Psychiatric/Behavioral: Negative for depression. The patient is not nervous/anxious and does not have insomnia.      VITAL SIGNS:   Vitals:   03/02/18 1916 03/02/18 2057 03/02/18 2133 03/02/18 2307  BP: (!) 100/40 (!) 92/54 (!) 107/54 133/65  Pulse: 62 65 64 71  Resp: 18 18  18   Temp:      TempSrc:      SpO2: 98% 93% 95% 96%  Weight:      Height:       Wt Readings from Last 3 Encounters:  03/02/18 79.4 kg    PHYSICAL EXAMINATION:  Physical Exam  Vitals reviewed. Constitutional: She is oriented to person, place, and time. She appears well-developed and well-nourished. No distress.  HENT:  Head: Normocephalic and atraumatic.  Mouth/Throat: Oropharynx is clear and moist.  Eyes: Pupils are equal, round, and reactive to light. Conjunctivae and EOM are normal. No scleral icterus.  Neck: Normal range of motion. Neck supple. No JVD present. No thyromegaly present.  Cardiovascular: Normal rate, regular rhythm and intact distal pulses. Exam reveals no gallop and no friction rub.  No murmur heard. Respiratory: Effort normal and breath sounds normal. No respiratory distress. She has no wheezes. She has no rales.  GI: Soft. Bowel sounds are normal. She exhibits no distension. There is no tenderness.  Musculoskeletal: Normal range of motion. She exhibits no edema.  No  arthritis, no gout  Lymphadenopathy:    She has no cervical adenopathy.  Neurological: She is alert and oriented to person, place, and time. No cranial nerve deficit.  No dysarthria, no aphasia  Skin: Skin is warm and dry. No rash noted. No erythema.  Psychiatric: She has a normal mood and affect. Her behavior is normal. Judgment and thought content normal.    LABORATORY PANEL:   CBC Recent Labs  Lab 03/02/18 2142  WBC 9.0  HGB 12.5  HCT 36.4  PLT 203   ------------------------------------------------------------------------------------------------------------------  Chemistries  Recent Labs  Lab 03/02/18 2142  NA 141  K 3.7  CL 110  CO2 27  GLUCOSE 113*  BUN 25*  CREATININE 0.70  CALCIUM 8.5*  AST 22  ALT 19  ALKPHOS 71  BILITOT 0.3   ------------------------------------------------------------------------------------------------------------------  Cardiac Enzymes Recent Labs  Lab 03/02/18 2142  TROPONINI 0.08*   ------------------------------------------------------------------------------------------------------------------  RADIOLOGY:  Dg Lumbar Spine 2-3 Views  Result Date: 03/02/2018 CLINICAL DATA:  Fall.  Low back pain. EXAM: LUMBAR SPINE - 2-3 VIEW COMPARISON:  None. FINDINGS: This report assumes 5 non rib-bearing lumbar vertebrae. There is a mild-to-moderate superior L1 vertebral compression fracture of indeterminate chronicity with approximately 30% loss of vertebral body height. Otherwise preserved lumbar vertebral body heights. Mild-to-moderate multilevel lumbar degenerative disc disease, most prominent at T12-L1. No spondylolisthesis. Mild lower lumbar facet arthropathy. No aggressive appearing focal osseous lesions. IMPRESSION: 1. Mild-to-moderate superior L1 vertebral compression fracture of indeterminate chronicity. 2. No spondylolisthesis. 3. Mild-to-moderate multilevel lumbar degenerative changes as detailed. Electronically Signed   By: Ilona Sorrel M.D.   On: 03/02/2018 17:15   Dg Pelvis 1-2 Views  Result Date: 03/02/2018 CLINICAL DATA:  Fall with pain. EXAM: PELVIS - 1-2 VIEW COMPARISON:  None. FINDINGS: A calcification in the right-sided pelvis could represent a calcified fibroid or ovarian calcification. Soft tissues otherwise normal. No fractures identified. IMPRESSION: No fractures identified. Electronically Signed   By: Dorise Bullion III M.D   On: 03/02/2018 22:18    EKG:   Orders placed or performed during the hospital encounter of 03/02/18  . ED EKG  . ED EKG  . EKG 12-Lead  . EKG 12-Lead    IMPRESSION AND PLAN:  Principal Problem:   Elevated troponin -trend cardiac enzymes, get an echocardiogram and a cardiology consult Active Problems:   Anxiety -home dose anxiolytic   Depression -home dose antidepressant   Chronic thoracic aortic dissection (HCC) -stable as per chart review of recent CTA   GERD (gastroesophageal reflux disease) -Home dose PPI   HLD (hyperlipidemia) -Home dose antilipid  Chart review performed and case discussed with ED provider. Labs, imaging and/or ECG reviewed by provider and discussed with patient/family. Management plans discussed with the patient and/or family.  DVT PROPHYLAXIS: SubQ lovenox   GI PROPHYLAXIS:  None  ADMISSION STATUS: Observation  CODE STATUS: Full Advance Directive Documentation     Most Recent Value  Type of Advance Directive  Living will, Healthcare Power of Attorney  Pre-existing out of facility DNR order (yellow form or pink MOST form)  -  "MOST" Form in Place?  -      TOTAL TIME TAKING CARE OF THIS PATIENT: 40 minutes.   Terald Jump [place] 03/02/2018, 11:18 PM  [employer]  [phone]  CC: Primary care physician; Hector Brunswick, MD  Note:  This document was prepared using Dragon voice recognition software and may include unintentional dictation errors.

## 2018-03-02 NOTE — ED Notes (Signed)
CRITICAL LAB: TROPONIN is 0.08, [facility], Dr. Corky Downs notified, orders received

## 2018-03-03 ENCOUNTER — Observation Stay (HOSPITAL_BASED_OUTPATIENT_CLINIC_OR_DEPARTMENT_OTHER)
Admit: 2018-03-03 | Discharge: 2018-03-03 | Disposition: A | Discharge status: Home / Self Care | Payer: Medicare Other | Attending: Internal Medicine | Admitting: Internal Medicine

## 2018-03-03 DIAGNOSIS — I361 Nonrheumatic tricuspid (valve) insufficiency: Secondary | ICD-10-CM

## 2018-03-03 DIAGNOSIS — I7101 Dissection of thoracic aorta: Secondary | ICD-10-CM | POA: Diagnosis not present

## 2018-03-03 DIAGNOSIS — R748 Abnormal levels of other serum enzymes: Secondary | ICD-10-CM

## 2018-03-03 DIAGNOSIS — M545 Low back pain: Secondary | ICD-10-CM

## 2018-03-03 DIAGNOSIS — E782 Mixed hyperlipidemia: Secondary | ICD-10-CM | POA: Diagnosis not present

## 2018-03-03 LAB — CBC
HCT: 34.7 % — ABNORMAL LOW (ref 35.0–47.0)
Hemoglobin: 11.9 g/dL — ABNORMAL LOW (ref 12.0–16.0)
MCH: 31.3 pg (ref 26.0–34.0)
MCHC: 34.3 g/dL (ref 32.0–36.0)
MCV: 91.3 fL (ref 80.0–100.0)
Platelets: 187 10*3/uL (ref 150–440)
RBC: 3.8 MIL/uL (ref 3.80–5.20)
RDW: 13.8 % (ref 11.5–14.5)
WBC: 6.8 10*3/uL (ref 3.6–11.0)

## 2018-03-03 LAB — BASIC METABOLIC PANEL
Anion gap: 5 (ref 5–15)
BUN: 19 mg/dL (ref 8–23)
CALCIUM: 8.1 mg/dL — AB (ref 8.9–10.3)
CO2: 25 mmol/L (ref 22–32)
CREATININE: 0.67 mg/dL (ref 0.44–1.00)
Chloride: 111 mmol/L (ref 98–111)
GFR calc Af Amer: >60 mL/min (ref >60)
Glucose, Bld: 113 mg/dL — ABNORMAL HIGH (ref 70–99)
Potassium: 3.7 mmol/L (ref 3.5–5.1)
Sodium: 141 mmol/L (ref 135–145)

## 2018-03-03 LAB — TROPONIN I
TROPONIN I: 0.08 ng/mL — AB (ref <0.03)
TROPONIN I: 0.09 ng/mL — AB (ref <0.03)
Troponin I: 0.09 ng/mL (ref <0.03)

## 2018-03-03 LAB — ECHOCARDIOGRAM COMPLETE
HEIGHTINCHES: 64 in
WEIGHTICAEL: 2800 [oz_av]

## 2018-03-03 MED ORDER — ENOXAPARIN SODIUM 40 MG/0.4ML ~~LOC~~ SOLN
40.0000 mg | SUBCUTANEOUS | Status: DC
  Administered 2018-03-03: 40 mg via SUBCUTANEOUS
  Filled 2018-03-03: qty 0.4

## 2018-03-03 MED ORDER — ONDANSETRON HCL 4 MG/2ML IJ SOLN: 4.0000 mg | Freq: Four times a day (QID) | INTRAMUSCULAR | Status: DC | PRN

## 2018-03-03 MED ORDER — ALPRAZOLAM 0.5 MG PO TABS
0.5000 mg | ORAL_TABLET | Freq: Two times a day (BID) | ORAL | Status: DC | PRN
  Administered 2018-03-03 (×2): 0.5 mg via ORAL
  Filled 2018-03-03 (×2): qty 1

## 2018-03-03 MED ORDER — BUDESONIDE 0.5 MG/2ML IN SUSP
0.5000 mg | Freq: Two times a day (BID) | RESPIRATORY_TRACT | Status: DC
  Filled 2018-03-03: qty 2

## 2018-03-03 MED ORDER — ONDANSETRON HCL 4 MG PO TABS: 4.0000 mg | ORAL_TABLET | Freq: Four times a day (QID) | ORAL | Status: DC | PRN

## 2018-03-03 MED ORDER — ACETAMINOPHEN 325 MG PO TABS: 650.0000 mg | ORAL_TABLET | Freq: Four times a day (QID) | ORAL | Status: DC | PRN

## 2018-03-03 MED ORDER — DOCUSATE SODIUM 100 MG PO CAPS
100.0000 mg | ORAL_CAPSULE | Freq: Two times a day (BID) | ORAL | Status: DC
  Administered 2018-03-03 – 2018-03-04 (×3): 100 mg via ORAL
  Filled 2018-03-03 (×3): qty 1

## 2018-03-03 MED ORDER — OXYCODONE HCL 5 MG PO TABS
5.0000 mg | ORAL_TABLET | ORAL | Status: DC | PRN
  Administered 2018-03-03 – 2018-03-04 (×4): 5 mg via ORAL
  Filled 2018-03-03 (×4): qty 1

## 2018-03-03 MED ORDER — ATORVASTATIN CALCIUM 20 MG PO TABS
10.0000 mg | ORAL_TABLET | Freq: Every day | ORAL | Status: DC
  Administered 2018-03-03 – 2018-03-04 (×2): 10 mg via ORAL
  Filled 2018-03-03 (×2): qty 1

## 2018-03-03 MED ORDER — ACETAMINOPHEN 650 MG RE SUPP: 650.0000 mg | Freq: Four times a day (QID) | RECTAL | Status: DC | PRN

## 2018-03-03 MED ORDER — ALPRAZOLAM 0.5 MG PO TABS
0.5000 mg | ORAL_TABLET | Freq: Two times a day (BID) | ORAL | Status: DC
  Administered 2018-03-04: 0.5 mg via ORAL
  Filled 2018-03-03 (×2): qty 1

## 2018-03-03 MED ORDER — MORPHINE SULFATE (PF) 2 MG/ML IV SOLN: 2.0000 mg | INTRAVENOUS | Status: DC | PRN

## 2018-03-03 MED ORDER — FLUOXETINE HCL 20 MG PO CAPS
40.0000 mg | ORAL_CAPSULE | Freq: Every day | ORAL | Status: DC
  Administered 2018-03-03: 40 mg via ORAL
  Filled 2018-03-03: qty 2

## 2018-03-03 MED ORDER — ALBUTEROL SULFATE (2.5 MG/3ML) 0.083% IN NEBU
3.0000 mL | INHALATION_SOLUTION | Freq: Four times a day (QID) | RESPIRATORY_TRACT | Status: DC | PRN
  Filled 2018-03-03: qty 3

## 2018-03-03 MED ORDER — FLUOXETINE HCL 20 MG PO CAPS
20.0000 mg | ORAL_CAPSULE | Freq: Every day | ORAL | Status: DC
  Administered 2018-03-04: 20 mg via ORAL
  Filled 2018-03-03: qty 1

## 2018-03-03 MED ORDER — PSYLLIUM 95 % PO PACK
1.0000 | PACK | Freq: Every day | ORAL | Status: DC
  Filled 2018-03-03 (×2): qty 1

## 2018-03-03 MED ORDER — POLYETHYLENE GLYCOL 3350 17 G PO PACK
17.0000 g | PACK | Freq: Every day | ORAL | Status: DC | PRN
  Administered 2018-03-03: 17 g via ORAL
  Filled 2018-03-03: qty 1

## 2018-03-03 MED ORDER — SODIUM CHLORIDE 0.9 % IV SOLN
INTRAVENOUS | Status: AC
  Administered 2018-03-03: 02:00:00 via INTRAVENOUS

## 2018-03-03 MED ORDER — NON FORMULARY: 1.0000 | Freq: Two times a day (BID) | Status: DC

## 2018-03-03 MED ORDER — CYCLOBENZAPRINE HCL 5 MG PO TABS
7.5000 mg | ORAL_TABLET | Freq: Three times a day (TID) | ORAL | Status: DC
  Administered 2018-03-04: 7.5 mg via ORAL
  Filled 2018-03-03 (×8): qty 1.5

## 2018-03-03 NOTE — Progress Notes (Signed)
Reviewed labs to find Trop was 0.09, which is up from 0.08 from last draw. Notified Dr. Tressia Miners. No new orders at this time.

## 2018-03-03 NOTE — ED Notes (Signed)
Reports  Complete att, securing transport with two people d/t pt's back pain

## 2018-03-03 NOTE — Evaluation (Signed)
Physical Therapy Evaluation Patient Details Name: Dorothy Callahan MRN: [id number] DOB: Nov 09, 1950 Today's Date: 03/03/2018   History of Present Illness  Pt admitted for elevated troponin, cleared to work with therapy per RN. Pt with complaints of fall with back and hip pain. History includes anxiety, depression, and aortic aneurysm.  Clinical Impression  Pt is a pleasant 67 year old female who was admitted for elevated troponin. Pt performs bed mobility with mod I, transfers with cga, and ambulation with cga and RW. Pt demonstrates deficits with strength/mobility/pain. Pt appears self limiting with ambulation. Pt also very drowsy initally, however woke up more as evaluation progressed. Would benefit from skilled PT to address above deficits and promote optimal return to PLOF. Recommend transition to [facility] upon discharge from acute hospitalization.       Follow Up Recommendations Home health PT    Equipment Recommendations  Rolling walker with 5" wheels    Recommendations for Other Services       Precautions / Restrictions Precautions Precautions: Fall Restrictions Weight Bearing Restrictions: No      Mobility  Bed Mobility Overal bed mobility: Modified Independent             General bed mobility comments: able to perform log roll and use railing to sit at EOB. Takes increased time for movement and transition to seated posture. Appears to be more alert in upright posture  Transfers Overall transfer level: Needs assistance Equipment used: Rolling walker (2 wheeled) Transfers: Sit to/from Stand Sit to Stand: Min guard         General transfer comment: needed 2 attempts for standing from raised bed. Cues for sequencing including hand placement and anterior weight shift. Once standing upright posture noted  Ambulation/Gait Ambulation/Gait assistance: Min guard Gait Distance (Feet): 10 Feet Assistive device: Rolling walker (2 wheeled) Gait Pattern/deviations:  Step-to pattern     General Gait Details: ambulated to recliner->BSC->recliner. RW used with slow gait speed and step to gait pattern. Pt limited by pain and refuses to ambulate further. Self limiting. Safe technique with RW  Stairs            Wheelchair Mobility    Modified Rankin (Stroke Patients Only)       Balance Overall balance assessment: History of Falls;Needs assistance Sitting-balance support: Feet supported Sitting balance-Leahy Scale: Good     Standing balance support: Bilateral upper extremity supported Standing balance-Leahy Scale: Fair                               Pertinent Vitals/Pain Pain Assessment: Faces Faces Pain Scale: Hurts a little bit Pain Location: low back Pain Descriptors / Indicators: Aching Pain Intervention(s): Limited activity within patient's tolerance    Home Living Family/patient expects to be discharged to:: Private residence Living Arrangements: Spouse/significant other;Children Available Help at Discharge: Family(has daughter to assist as well) Type of Home: House Home Access: Stairs to enter Entrance Stairs-Rails: Can reach both Entrance Stairs-Number of Steps: 4 Home Layout: One level Home Equipment: None      Prior Function Level of Independence: Independent         Comments: reports history of falls with recent fall out of a chair     Hand Dominance        Extremity/Trunk Assessment   Upper Extremity Assessment Upper Extremity Assessment: Overall WFL for tasks assessed    Lower Extremity Assessment Lower Extremity Assessment: Generalized weakness(B LE grossly 4/5)  Communication   Communication: No difficulties  Cognition Arousal/Alertness: Lethargic Behavior During Therapy: WFL for tasks assessed/performed Overall Cognitive Status: Within Functional Limits for tasks assessed                                 General Comments: very sleepy      General Comments       Exercises Other Exercises Other Exercises: Pt ambulated to [facility] with cga and safe technique. Needs additional person for safety during hygiene as pt unable to clean self. Slight buckling noted when standing from lower surface.   Assessment/Plan    PT Assessment Patient needs continued PT services  PT Problem List Decreased strength;Decreased activity tolerance;Decreased balance;Decreased mobility;Pain;Decreased knowledge of use of DME       PT Treatment Interventions Gait training;DME instruction;Therapeutic exercise;Balance training    PT Goals (Current goals can be found in the Care Plan section)  Acute Rehab PT Goals Patient Stated Goal: to get stronger PT Goal Formulation: With patient Time For Goal Achievement: 03/17/18 Potential to Achieve Goals: Good    Frequency Min 2X/week   Barriers to discharge        Co-evaluation               AM-PAC PT "6 Clicks" Daily Activity  Outcome Measure Difficulty turning over in bed (including adjusting bedclothes, sheets and blankets)?: None Difficulty moving from lying on back to sitting on the side of the bed? : None Difficulty sitting down on and standing up from a chair with arms (e.g., wheelchair, bedside commode, etc,.)?: Unable Help needed moving to and from a bed to chair (including a wheelchair)?: A Little Help needed walking in hospital room?: A Little Help needed climbing 3-5 steps with a railing? : A Lot 6 Click Score: 17    End of Session   Activity Tolerance: Patient tolerated treatment well Patient left: in chair;with chair alarm set Nurse Communication: Mobility status PT Visit Diagnosis: Unsteadiness on feet (R26.81);Muscle weakness (generalized) (M62.81);Difficulty in walking, not elsewhere classified (R26.2)    Time: 1429-1500 PT Time Calculation (min) (ACUTE ONLY): 31 min   Charges:   PT Evaluation $PT Eval Low Complexity: 1 Low PT Treatments $Therapeutic Activity: 8-22 mins         Dorothy Callahan, PT, DPT [phone]   Dorothy Callahan 03/03/2018, 3:22 PM

## 2018-03-03 NOTE — Care Management Obs Status (Signed)
[facility] NOTIFICATION   Patient Details  Name: Dorothy Callahan MRN: [id number] Date of Birth: October 09, 1950   Medicare Observation Status Notification Given:  Yes    Lacresia Darwish A Makaya Juneau, RN 03/03/2018, 8:23 AM

## 2018-03-03 NOTE — Progress Notes (Signed)
[facility] NAME: Dorothy Callahan    MR#:  [id number]  DATE OF BIRTH:  Nov 29, 1950  SUBJECTIVE:  CHIEF COMPLAINT:   Chief Complaint  Patient presents with  . Fall   -Came in with a fall and complains of low back pain and legs feeling heavy. -Also constipated  REVIEW OF SYSTEMS:  Review of Systems  Constitutional: Negative for chills, fever and malaise/fatigue.  HENT: Negative for congestion, ear discharge, hearing loss and nosebleeds.   Eyes: Negative for blurred vision and double vision.  Respiratory: Negative for cough, shortness of breath and wheezing.   Cardiovascular: Negative for chest pain and palpitations.  Gastrointestinal: Positive for constipation. Negative for abdominal pain, diarrhea, nausea and vomiting.  Genitourinary: Negative for dysuria.  Musculoskeletal: Positive for back pain and myalgias.  Neurological: Negative for dizziness, focal weakness, seizures, weakness and headaches.  Psychiatric/Behavioral: Negative for depression.    DRUG ALLERGIES:   Allergies  Allergen Reactions  . Pregabalin Other (See Comments)    Hallucinations and falling   . Denosumab Other (See Comments)    Reaction: body aches  . Aspirin Other (See Comments)    Reaction: "makes me feel like I have the flu"  . Topiramate Other (See Comments)    Reaction: Slurred speach   . Tramadol Other (See Comments)    Reaction: "loopy"  . Amitriptyline Other (See Comments)    Other reaction(s): Unknown     VITALS:  Blood pressure (!) 116/52, pulse 60, temperature 98.5 F (36.9 C), temperature source Oral, resp. rate 17, height 5\' 4"  (1.626 m), weight 79.4 kg, SpO2 96 %.  PHYSICAL EXAMINATION:  Physical Exam  GENERAL:  67 y.o.-year-old patient lying in the bed with no acute distress.  EYES: Pupils equal, round, reactive to light and accommodation. No scleral icterus. Extraocular muscles intact.  HEENT: Head atraumatic,  normocephalic. Oropharynx and nasopharynx clear.  NECK:  Supple, no jugular venous distention. No thyroid enlargement, no tenderness.  LUNGS: Normal breath sounds bilaterally, no wheezing, rales,rhonchi or crepitation. No use of accessory muscles of respiration.  CARDIOVASCULAR: S1, S2 normal. No murmurs, rubs, or gallops.  ABDOMEN: Soft, nontender, nondistended. Bowel sounds present. No organomegaly or mass.  EXTREMITIES: No pedal edema, cyanosis, or clubbing.  Straight leg raising test is positive on both sides, right greater than left NEUROLOGIC: Cranial nerves II through XII are intact. Muscle strength 5/5 in all extremities. Sensation intact. Gait not checked.  PSYCHIATRIC: The patient is alert and oriented x 3.  SKIN: No obvious rash, lesion, or ulcer.    LABORATORY PANEL:   CBC Recent Labs  Lab 03/03/18 0316  WBC 6.8  HGB 11.9*  HCT 34.7*  PLT 187   ------------------------------------------------------------------------------------------------------------------  Chemistries  Recent Labs  Lab 03/02/18 2142 03/03/18 0316  NA 141 141  K 3.7 3.7  CL 110 111  CO2 27 25  GLUCOSE 113* 113*  BUN 25* 19  CREATININE 0.70 0.67  CALCIUM 8.5* 8.1*  AST 22  --   ALT 19  --   ALKPHOS 71  --   BILITOT 0.3  --    ------------------------------------------------------------------------------------------------------------------  Cardiac Enzymes Recent Labs  Lab 03/03/18 1028  TROPONINI 0.09*   ------------------------------------------------------------------------------------------------------------------  RADIOLOGY:  Dg Lumbar Spine 2-3 Views  Result Date: 03/02/2018 CLINICAL DATA:  Fall.  Low back pain. EXAM: LUMBAR SPINE - 2-3 VIEW COMPARISON:  None. FINDINGS: This report assumes 5 non rib-bearing lumbar vertebrae. There is a mild-to-moderate superior L1  vertebral compression fracture of indeterminate chronicity with approximately 30% loss of vertebral body height.  Otherwise preserved lumbar vertebral body heights. Mild-to-moderate multilevel lumbar degenerative disc disease, most prominent at T12-L1. No spondylolisthesis. Mild lower lumbar facet arthropathy. No aggressive appearing focal osseous lesions. IMPRESSION: 1. Mild-to-moderate superior L1 vertebral compression fracture of indeterminate chronicity. 2. No spondylolisthesis. 3. Mild-to-moderate multilevel lumbar degenerative changes as detailed. Electronically Signed   By: Ilona Sorrel M.D.   On: 03/02/2018 17:15   Dg Pelvis 1-2 Views  Result Date: 03/02/2018 CLINICAL DATA:  Fall with pain. EXAM: PELVIS - 1-2 VIEW COMPARISON:  None. FINDINGS: A calcification in the right-sided pelvis could represent a calcified fibroid or ovarian calcification. Soft tissues otherwise normal. No fractures identified. IMPRESSION: No fractures identified. Electronically Signed   By: Dorise Bullion III M.D   On: 03/02/2018 22:18    EKG:   Orders placed or performed during the hospital encounter of 03/02/18  . ED EKG  . ED EKG  . EKG 12-Lead  . EKG 12-Lead    ASSESSMENT AND PLAN:   67 year old female with past medical history significant for chronic low back pain, asthma, depression anxiety presents from home after a fall and worsening back pain.  1.  Fall and low back pain-x-ray with old L3 compression fracture. -Patient has had back pain issues and degenerative disc disease in the lower back. -No acute fracture noted.  Add muscle relaxants, pain medicines and monitor -Physical therapy consult -If no improvement or worsening-we will get MRI of lumbar spine  2.  Elevated troponin-appreciate cardiology consult.  Likely demand ischemia.  No plans for further cardiac testing at this time.  3.  Chronic descending aortic dissection-type B, no indication for repeating CT at this time.  Continue to follow-up as outpatient  4.  Depression anxiety-continue Prozac and Xanax  5.  Constipation-meds added  6.  DVT  prophylaxis-Lovenox     All the records are reviewed and case discussed with Care Management/Social Workerr. Management plans discussed with the patient, family and they are in agreement.  CODE STATUS: Full code  TOTAL TIME TAKING CARE OF THIS PATIENT: 37 minutes.   POSSIBLE D/C IN 1-2 DAYS, DEPENDING ON CLINICAL CONDITION.   Gladstone Lighter M.D on 03/03/2018 at 12:21 PM  Between 7am to 6pm - Pager - [phone]  After 6pm go to [email or web address] - password [email or web address] [facility]  Office  [phone]  CC: Primary care physician; Hector Brunswick, MD

## 2018-03-03 NOTE — Care Management Note (Signed)
Case Management Note  Patient Details  Name: Dorothy Callahan MRN: [id number] Date of Birth: [birth year]  Subjective/Objective:  Patient admitted to [facility] under observation status for elevated troponin. RNCM consulted on patient to provide MOON letter and complete assessment. Patient currently lives at home with her spouse and tells me she is completely independent with all activities of daily living. Voices concern over current level of mobility since she "cant move her legs because they are too heavy". Patient "does not even want to begin to think about physical therapy or any form of movement right now". Patient uses no DME at home and still drives. No PT consult as of yet since patient refuses to get out of the bed. I have offered workup for home health at discharge but she needs time to consider it and see if she progresses. Has PCP. Uses [facility] and obtains medications without issue.                     Action/Plan: RNCM to continue to follow for any needs.   Expected Discharge Date:  03/04/18               Expected Discharge Plan:     In-House Referral:     Discharge planning Services     Post Acute Care Choice:    Choice offered to:     DME Arranged:    DME Agency:     HH Arranged:    HH Agency:     Status of Service:     If discussed at [employer], dates discussed:    Additional Comments:  Shanicqua Coldren A Aundra Pung, RN 03/03/2018, 9:22 AM

## 2018-03-03 NOTE — Consult Note (Signed)
Cardiology Consultation:   Patient ID: Dorothy Callahan; [id number]; 01/13/1951   Admit date: 03/02/2018 Date of Consult: 03/03/2018  Primary Care Provider: Hector Brunswick, MD Primary Cardiologist: New to [facility] Reason for consult: elevated troponin Physician requesting consult: Dr. Jannifer Franklin   Patient Profile:   Dorothy Callahan is a 67 y.o. female with a hx of tight B aortic dissection/followed at [facility], chronic back pain, anxiety depression, presenting after a recent fall off a chair, noted to have elevated troponin in the emergency room  History of Present Illness:   [name] in 2016 with type B aortic dissection extending down left iliac artery, routine follow-up performed at [facility], recent CT scan one month ago showing stable disease, presenting after falling off a chair, noted to have minimally elevated troponinin the emergency room, was kept for observation Overnight and this morning reporting having severe back pain Long discussion concerning her back history, numerous epidurals Limited in her ability to exert herself secondary to chronic back symptoms  She was sitting on a office chair bending over picking things up off the ground when she fell off the chair, and hurt her back In the emergency room had back pain and bilateral hip pain Was given fentanyl en route by EMS X-ray showing mild to moderate superior L1 vertebral compression fracture indeterminate chronicity also mild to moderate multilevel lumbar degenerative changes  Also reports having some epigastric discomfort recently  Denies any significant chest pain on exertion  Recent CT scan from one month ago reviewed previously by myself showing mild coronary calcification otherwise no significant PAD  Past Medical History:  Diagnosis Date  . Anxiety   . Arthritis   . Asthma   . Depression   . Descending aortic aneurysm ([place])   . 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 ENDOMETRIAL ABLATION    . TONSILLECTOMY       Home Medications:  Prior to Admission medications   Medication Sig Start Date End Date Taking? Authorizing Provider  ALPRAZolam Duanne Moron) 1 MG tablet Take 0.5 mg by mouth 2 (two) times daily. 02/02/18  Yes [provider]  atorvastatin (LIPITOR) 10 MG tablet Take 10 mg by mouth at bedtime.    Yes [provider]  cyanocobalamin (,VITAMIN B-12,) 1000 MCG/ML injection Inject 1 mL into the muscle every 30 (thirty) days. 02/18/18  Yes [provider]  FLOVENT HFA 220 MCG/ACT inhaler Inhale 1 puff into the lungs 2 (two) times daily. 01/08/18  Yes [provider]  FLUoxetine (PROZAC) 20 MG capsule Take 20 mg by mouth daily. 01/11/18  Yes [provider]  gabapentin (NEURONTIN) 100 MG capsule Take 100 mg by mouth at bedtime.    Yes [provider]  metoprolol succinate (TOPROL-XL) 50 MG 24 hr tablet Take 25 mg by mouth at bedtime.  12/30/17  Yes [provider]  Vitamin D, Ergocalciferol, (DRISDOL) 50000 units CAPS capsule Take 1 capsule by mouth once a week. Takes on Sunday 01/26/18  Yes [provider]  albuterol (PROVENTIL HFA;VENTOLIN HFA) 108 (90 Base) MCG/ACT inhaler Inhale into the lungs every 6 (six) hours as needed for wheezing or shortness of breath.    [provider]  cyclobenzaprine (FLEXERIL) 10 MG tablet Take 1 tablet (10 mg total) by mouth 3 (three) times daily as needed. 03/02/18   Sable Feil, PA-C  ibuprofen (ADVIL,MOTRIN) 600 MG tablet Take 1 tablet (600 mg total) by mouth  every 8 (eight) hours as needed. 03/02/18   Sable Feil, PA-C  oxyCODONE-acetaminophen (PERCOCET) 7.5-325 MG tablet Take 1 tablet by mouth every 6 (six) hours as needed for severe pain. 03/02/18   Sable Feil, PA-C    Inpatient Medications: Scheduled Meds: . ALPRAZolam  0.5 mg Oral BID  . atorvastatin  10 mg Oral Daily  . budesonide (PULMICORT) nebulizer  solution  0.5 mg Nebulization BID  . cyclobenzaprine  7.5 mg Oral TID  . docusate sodium  100 mg Oral BID  . enoxaparin (LOVENOX) injection  40 mg Subcutaneous Q24H  . [START ON 03/04/2018] FLUoxetine  20 mg Oral Daily  . psyllium  1 packet Oral Daily   Continuous Infusions:  PRN Meds: acetaminophen **OR** acetaminophen, albuterol, morphine injection, ondansetron **OR** ondansetron (ZOFRAN) IV, oxyCODONE, polyethylene glycol  Allergies:    Allergies  Allergen Reactions  . Pregabalin Other (See Comments)    Hallucinations and falling   . Denosumab Other (See Comments)    Reaction: body aches  . Aspirin Other (See Comments)    Reaction: "makes me feel like I have the flu"  . Topiramate Other (See Comments)    Reaction: Slurred speach   . Tramadol Other (See Comments)    Reaction: "loopy"  . Amitriptyline Other (See Comments)    Other reaction(s): Unknown     Social History:   Social History   Socioeconomic History  . Marital status: Married    Spouse name: Not on file  . Number of children: Not on file  . Years of education: Not on file  . Highest education level: Not on file  Occupational History  . Not on file  Social Needs  . Financial resource strain: Not on file  . Food insecurity:    Worry: Not on file    Inability: Not on file  . Transportation needs:    Medical: Not on file    Non-medical: Not on file  Tobacco Use  . Smoking status: Never Smoker  . Smokeless tobacco: Never Used  Substance and Sexual Activity  . Alcohol use: Never    Frequency: Never  . Drug use: Never  . Sexual activity: Not Currently  Lifestyle  . Physical activity:    Days per week: Not on file    Minutes per session: Not on file  . Stress: Not on file  Relationships  . Social connections:    Talks on phone: Not on file    Gets together: Not on file    Attends religious service: Not on file    Active member of club or organization: Not on file    Attends meetings of clubs  or organizations: Not on file    Relationship status: Not on file  . Intimate partner violence:    Fear of current or ex partner: Not on file    Emotionally abused: Not on file    Physically abused: Not on file    Forced sexual activity: Not on file  Other Topics Concern  . Not on file  Social History Narrative  . Not on file    Family History:    Family History  Problem Relation Age of Onset  . Aortic aneurysm Father   . Aortic aneurysm Brother      ROS:  Please see the history of present illness.  Review of Systems  Constitutional: Negative.   Respiratory: Negative.   Cardiovascular: Negative.   Gastrointestinal: Negative.   Musculoskeletal: Positive for back pain.  Hip pain  Neurological: Negative.   Psychiatric/Behavioral: Negative.   All other systems reviewed and are negative.   Physical Exam/Data:   Vitals:   03/02/18 2307 03/03/18 0016 03/03/18 0121 03/03/18 0756  BP: 133/65 (!) 118/52 (!) 107/53 (!) 116/52  Pulse: 71 66 65 60  Resp: 18 16 16 17   Temp:   97.9 F (36.6 C) 98.5 F (36.9 C)  TempSrc:   Oral Oral  SpO2: 96% 92% 97% 96%  Weight:      Height:        Intake/Output Summary (Last 24 hours) at 03/03/2018 1528 Last data filed at 03/03/2018 0500 Gross per 24 hour  Intake 1251.46 ml  Output -  Net 1251.46 ml   Filed Weights   03/02/18 1624  Weight: 79.4 kg   Body mass index is 30.04 kg/m.  General:  Well nourished, well developed, in no acute distress HEENT: normal Lymph: no adenopathy Neck: no JVD Endocrine:  No thryomegaly Vascular: No carotid bruits; FA pulses 2+ bilaterally without bruits  Cardiac:  normal S1, S2; RRR; no murmur  Lungs:  clear to auscultation bilaterally, no wheezing, rhonchi or rales  Abd: soft, nontender, no hepatomegaly  Ext: no edema Musculoskeletal:  No deformities, BUE and BLE strength normal and equal Skin: warm and dry  Neuro:  CNs 2-12 intact, no focal abnormalities noted Psych:  Normal affect    EKG:  The EKG was personally reviewed and demonstrates:   Normal sinus rhythm rate 63 bpm nonspecific ST abnormality in leads V1 and V2  Telemetry:  Telemetry was personally reviewed and demonstrates:  Normal sinus rhythm  Relevant CV Studies: Echo Left ventricle: The cavity size was normal. Systolic function was   normal. The estimated ejection fraction was in the range of 50%   to 55%. Wall motion was normal; there were no regional wall   motion abnormalities. Features are consistent with a pseudonormal   left ventricular filling pattern, with concomitant abnormal   relaxation and increased filling pressure (grade 2 diastolic   dysfunction). - Left atrium: The atrium was normal in size. - Right ventricle: Systolic function was normal. - Pulmonary arteries: Systolic pressure was mildly elevated. PA   peak pressure: 36 mm Hg (S).  Laboratory Data:  Chemistry Recent Labs  Lab 03/02/18 2142 03/03/18 0316  NA 141 141  K 3.7 3.7  CL 110 111  CO2 27 25  GLUCOSE 113* 113*  BUN 25* 19  CREATININE 0.70 0.67  CALCIUM 8.5* 8.1*  GFRNONAA >60 >60  GFRAA >60 >60  ANIONGAP 4* 5    Recent Labs  Lab 03/02/18 2142  PROT 6.0*  ALBUMIN 3.6  AST 22  ALT 19  ALKPHOS 71  BILITOT 0.3   Hematology Recent Labs  Lab 03/02/18 2142 03/03/18 0316  WBC 9.0 6.8  RBC 4.05 3.80  HGB 12.5 11.9*  HCT 36.4 34.7*  MCV 89.9 91.3  MCH 30.9 31.3  MCHC 34.4 34.3  RDW 13.7 13.8  PLT 203 187   Cardiac Enzymes Recent Labs  Lab 03/02/18 2142 03/03/18 0316 03/03/18 1028  TROPONINI 0.08* 0.08* 0.09*   No results for input(s): TROPIPOC in the last 168 hours.  BNPNo results for input(s): BNP, PROBNP in the last 168 hours.  DDimer No results for input(s): DDIMER in the last 168 hours.  Radiology/Studies:  Dg Lumbar Spine 2-3 Views  Result Date: 03/02/2018 CLINICAL DATA:  Fall.  Low back pain. EXAM: LUMBAR SPINE - 2-3 VIEW COMPARISON:  None. FINDINGS:  This report assumes 5 non  rib-bearing lumbar vertebrae. There is a mild-to-moderate superior L1 vertebral compression fracture of indeterminate chronicity with approximately 30% loss of vertebral body height. Otherwise preserved lumbar vertebral body heights. Mild-to-moderate multilevel lumbar degenerative disc disease, most prominent at T12-L1. No spondylolisthesis. Mild lower lumbar facet arthropathy. No aggressive appearing focal osseous lesions. IMPRESSION: 1. Mild-to-moderate superior L1 vertebral compression fracture of indeterminate chronicity. 2. No spondylolisthesis. 3. Mild-to-moderate multilevel lumbar degenerative changes as detailed. Electronically Signed   By: Ilona Sorrel M.D.   On: 03/02/2018 17:15   Dg Pelvis 1-2 Views  Result Date: 03/02/2018 CLINICAL DATA:  Fall with pain. EXAM: PELVIS - 1-2 VIEW COMPARISON:  None. FINDINGS: A calcification in the right-sided pelvis could represent a calcified fibroid or ovarian calcification. Soft tissues otherwise normal. No fractures identified. IMPRESSION: No fractures identified. Electronically Signed   By: Dorise Bullion III M.D   On: 03/02/2018 22:18    Assessment and Plan:   1. Elevated troponin Numbers plateaued with no significant change No symptoms concerning for angina,  Presenting for back pain No known coronary disease EKGs relatively benign Recent CT scan with mild coronary calcification.  This can likely be managed medically No further ischemic workup needed at this time  2) acute on chronic back pain Following a fall Likely muscular ligamental injury Long history of back pain per the patient with prior epidurals X-ray with [facility], old vertebral fracture  3) type B aortic dissection Presenting 3 years ago, followed by [facility] with periodic CT scans Recent CT scan one month ago showing stable disease No further workup needed at this time  4) depression anxiety Managed by primary care On antidepressants  5) hyperlipidemia Continue statin     Total encounter time more than 110 minutes  Greater than 50% was spent in counseling and coordination of care with the patient   For questions or updates, please contact [facility] Please consult [email or web address] for contact info under Cardiology/STEMI.   Signed, Ida Rogue, MD  03/03/2018 3:28 PM

## 2018-03-03 NOTE — Progress Notes (Signed)
87% rm air sat on cpap. Added 2 lpm O2. Sat now 92.

## 2018-03-04 ENCOUNTER — Observation Stay: Payer: Medicare Other

## 2018-03-04 IMAGING — CR DG CHEST 2V
1 series · 2 of 2 positions shown · non-contrast
Comparison: [DATE] lumbar spine images

CLINICAL DATA: Hypoxia, recent fall with increased pain in lower
back that radiated into left hip, history of arthritis, asthma

EXAM:
CHEST - 2 VIEW

[Series 1: w chest pa · 0.14mm/px · 2 of 2 slices shown]
[im 1/2]
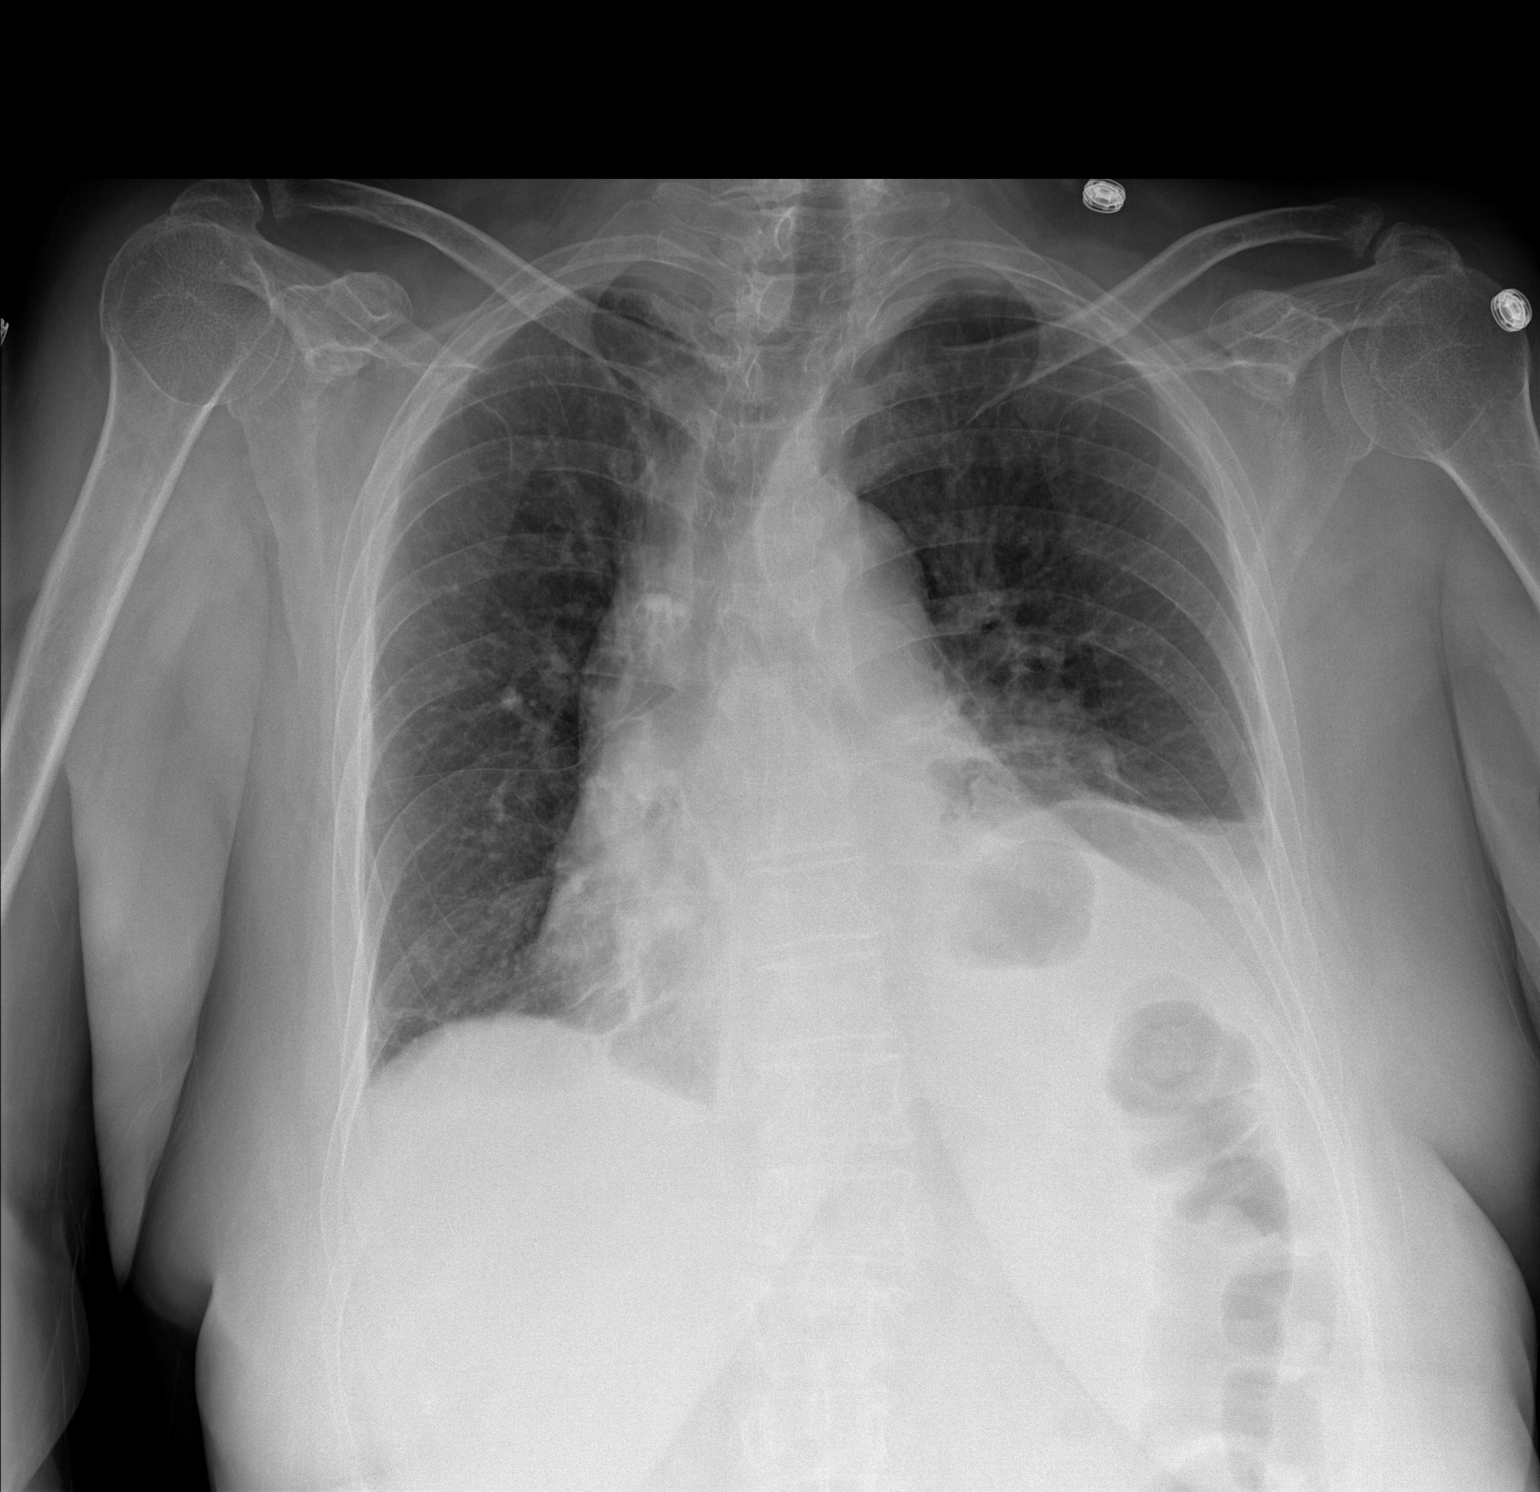
[im 2/2]
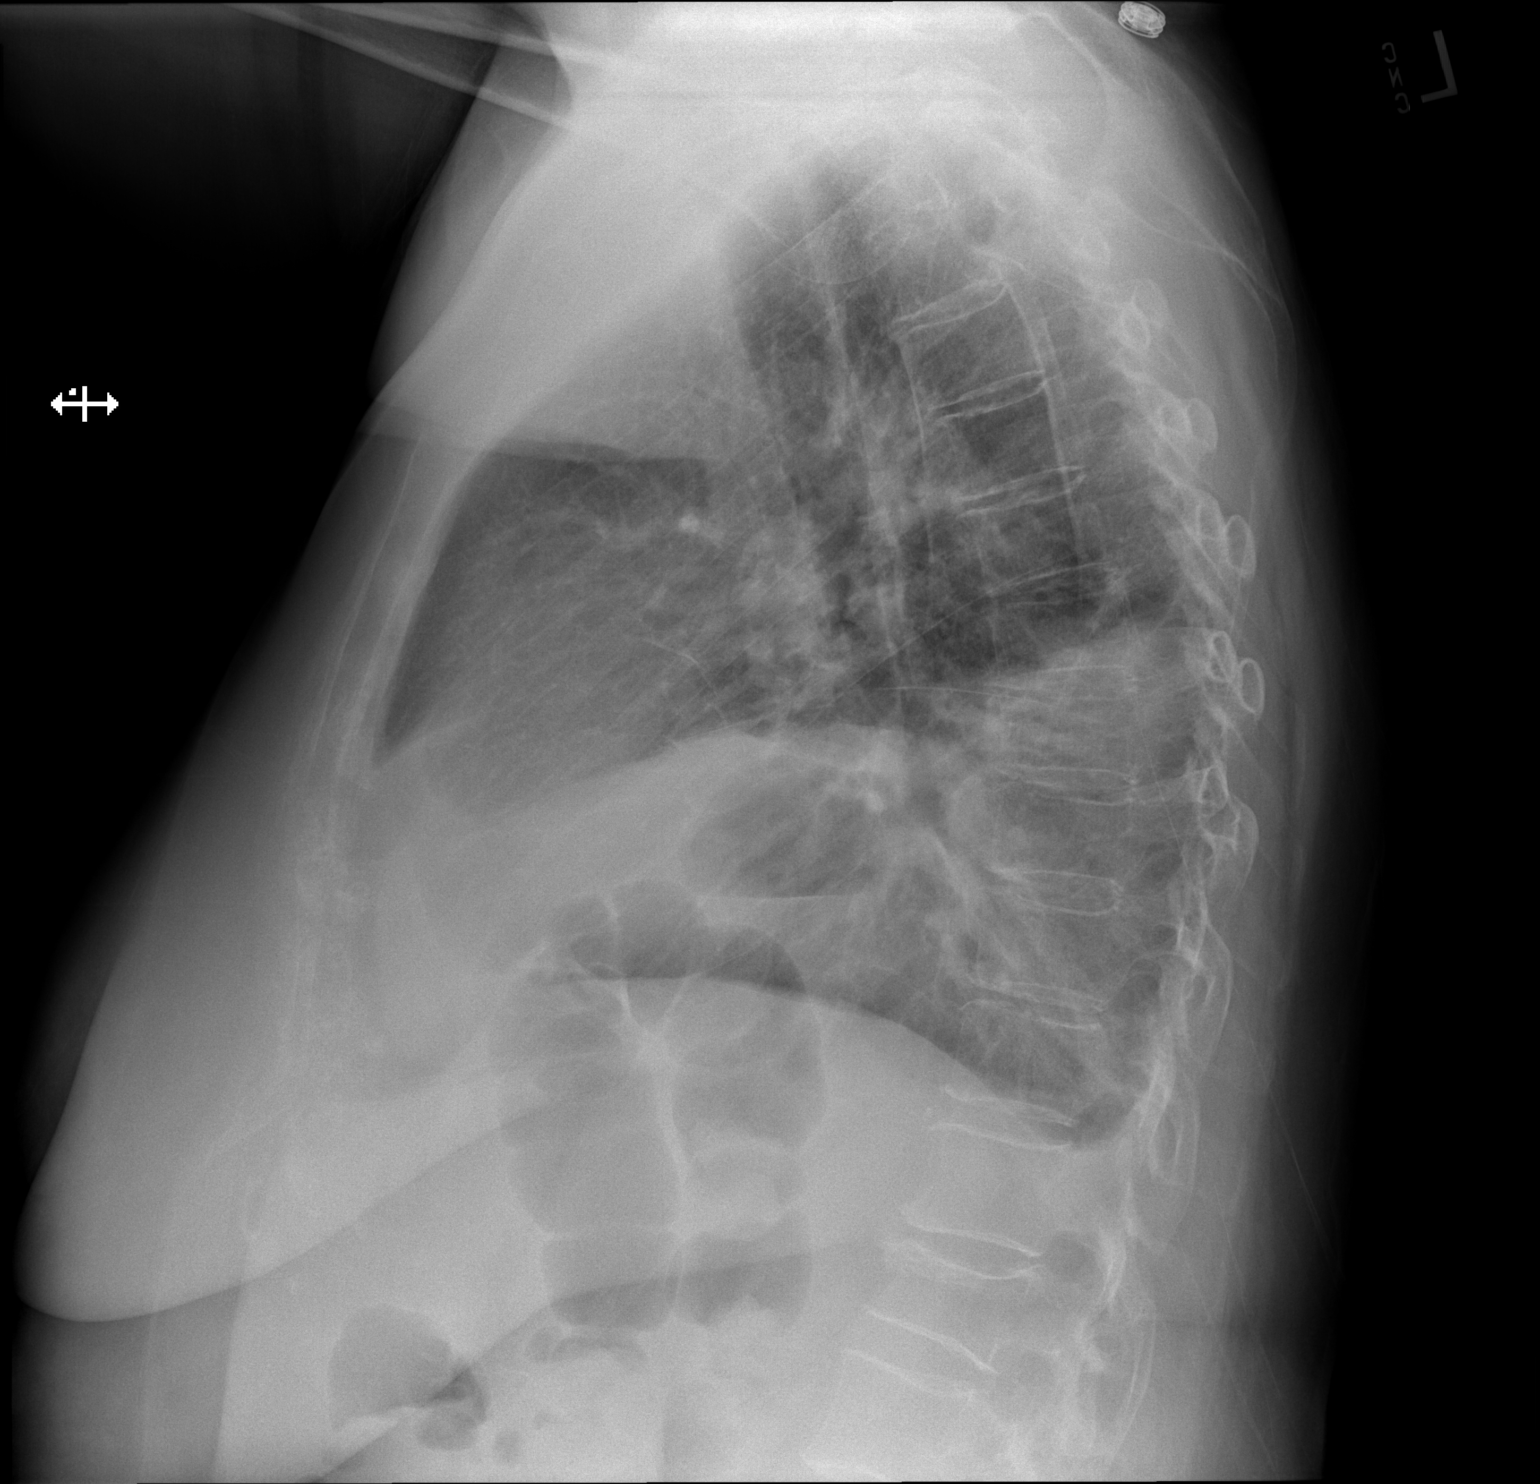

[2 of 2 positions shown; findings below may reference images not displayed]

FINDINGS: Heart size is mildly enlarged. There is opacity at the LEFT lung
base, consistent pleural effusion and consolidation or significant
atelectasis. Trace RIGHT pleural effusion is also present. There is
no pulmonary edema.

There is scoliosis of the thoracic spine associated with
degenerative change. Stable appearance of wedge compression fracture
at L1.
IMPRESSION: LEFT pleural effusion and LEFT LOWER lobe consolidation and/or
significant atelectasis.

Cardiomegaly.  No pulmonary edema.

## 2018-03-04 NOTE — Care Management (Addendum)
Met with patient and her spouse at bedside to discuss discharge planning. Patient will need home health PT. She will be living here temporarily for the next 2 weeks at : [address]  [place],  [id number] [phone]. Offered a list of home health agencies and she has no preference. Referral to [facility] for HHPT. No DME needs.

## 2018-03-04 NOTE — Progress Notes (Addendum)
Patient is being discharged home with husband. No new scripts (given to patient in ER and was filled already). Reviewed last dose given. Patient was able to walk around the nurse's station with walker. Waiting for walker at this time. IV removed with cath intact. Patient's PCP Is in [facility], gave patient copy of lab work with discharge paperwork. She will follow-up with PCP.

## 2018-03-04 NOTE — Discharge Summary (Signed)
[facility] NAME: Dorothy Callahan    MR#:  [id number]  DATE OF BIRTH:  [birth year]  DATE OF ADMISSION:  03/02/2018   ADMITTING PHYSICIAN: Lance Coon, MD  DATE OF DISCHARGE:  03/04/18  PRIMARY CARE PHYSICIAN: Hector Brunswick, MD   ADMISSION DIAGNOSIS:   Elevated troponin [R74.8] Fall, initial encounter [id number]] Contusion of coccyx, initial encounter [S30.0XXA]  DISCHARGE DIAGNOSIS:   Principal Problem:   Elevated troponin Active Problems:   Anxiety   Depression   GERD (gastroesophageal reflux disease)   HLD (hyperlipidemia)   Chronic thoracic aortic dissection (HCC)   SECONDARY DIAGNOSIS:   Past Medical History:  Diagnosis Date  . Anxiety   . Arthritis   . Asthma   . Depression   . 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  1.  Fall and low back pain-x-ray with old L3 compression fracture. -Patient has had back pain issues and degenerative disc disease in the lower back. -No acute fracture noted.  Added muscle relaxants, pain medicines with improvement. -Physical therapy consult appreciated-Will need home health -If no improvement or worsening-follow-up to get MRI of lumbar spine -Since improvement noted, able to ambulate-discharge home today.  2.  Elevated troponin-appreciate cardiology consult.  Likely demand ischemia.  No plans for further cardiac testing at this time. -CT angiogram of the chest done as an outpatient showing only minimal coronary calcifications.  3.  Chronic descending aortic dissection-type B, no indication for repeating CT at this time.  Continue to follow-up as outpatient  4.  Depression anxiety-continue Prozac and Xanax   DISCHARGE CONDITIONS:    Guarded  CONSULTS OBTAINED:   Treatment Team:  Minna Merritts, MD  DRUG ALLERGIES:   Allergies  Allergen Reactions  . Pregabalin Other (See Comments)    Hallucinations and falling   . Denosumab Other (See Comments)    Reaction: body aches  . Aspirin Other (See Comments)    Reaction: "makes me feel like I have the flu"  . Topiramate Other (See Comments)    Reaction: Slurred speach   . Tramadol Other (See Comments)    Reaction: "loopy"  . Amitriptyline Other (See Comments)    Other reaction(s): Unknown    DISCHARGE MEDICATIONS:   Allergies as of 03/04/2018      Reactions   Pregabalin Other (See Comments)   Hallucinations and falling   Denosumab Other (See Comments)   Reaction: body aches   Aspirin Other (See Comments)   Reaction: "makes me feel like I have the flu"   Topiramate Other (See Comments)   Reaction: Slurred speach   Tramadol Other (See Comments)   Reaction: "loopy"   Amitriptyline Other (See Comments)   Other reaction(s): Unknown      Medication List    STOP taking these medications   cyanocobalamin 1000 MCG/ML injection Commonly known as:  (VITAMIN B-12)   gabapentin 100 MG capsule Commonly known as:  NEURONTIN     TAKE these medications   albuterol 108 (90 Base) MCG/ACT inhaler Commonly known as:  PROVENTIL HFA;VENTOLIN HFA Inhale into the lungs every 6 (six) hours as needed for wheezing or shortness of breath.   ALPRAZolam 1 MG tablet Commonly known as:  XANAX Take 0.5 mg by mouth 2 (  two) times daily.   atorvastatin 10 MG tablet Commonly known as:  LIPITOR Take 10 mg by mouth at bedtime.   cyclobenzaprine 10 MG tablet Commonly known as:  FLEXERIL Take 1 tablet (10 mg total) by mouth 3 (three) times daily as needed.   FLOVENT HFA 220 MCG/ACT inhaler Generic drug:  fluticasone Inhale 1 puff into the lungs 2 (two) times daily.   FLUoxetine 20 MG capsule Commonly known as:  PROZAC Take 20 mg by mouth daily.   ibuprofen 600  MG tablet Commonly known as:  ADVIL,MOTRIN Take 1 tablet (600 mg total) by mouth every 8 (eight) hours as needed.   metoprolol succinate 50 MG 24 hr tablet Commonly known as:  TOPROL-XL Take 25 mg by mouth at bedtime.   oxyCODONE-acetaminophen 7.5-325 MG tablet Commonly known as:  PERCOCET Take 1 tablet by mouth every 6 (six) hours as needed for severe pain.   Vitamin D (Ergocalciferol) 50000 units Caps capsule Commonly known as:  DRISDOL Take 1 capsule by mouth once a week. Takes on Sunday        DISCHARGE INSTRUCTIONS:   1. PCP f/u in 1 week  DIET:   Cardiac diet  ACTIVITY:   Activity as tolerated  OXYGEN:   Home Oxygen: No.  Oxygen Delivery: room air  DISCHARGE LOCATION:   home   If you experience worsening of your admission symptoms, develop shortness of breath, life threatening emergency, suicidal or homicidal thoughts you must seek medical attention immediately by calling 911 or calling your MD immediately  if symptoms less severe.  You Must read complete instructions/literature along with all the possible adverse reactions/side effects for all the Medicines you take and that have been prescribed to you. Take any new Medicines after you have completely understood and accpet all the possible adverse reactions/side effects.   Please note  You were cared for by a hospitalist during your hospital stay. If you have any questions about your discharge medications or the care you received while you were in the hospital after you are discharged, you can call the unit and asked to speak with the hospitalist on call if the hospitalist that took care of you is not available. Once you are discharged, your primary care physician will handle any further medical issues. Please note that NO REFILLS for any discharge medications will be authorized once you are discharged, as it is imperative that you return to your primary care physician (or establish a relationship with a primary  care physician if you do not have one) for your aftercare needs so that they can reassess your need for medications and monitor your lab values.    On the day of Discharge:  VITAL SIGNS:   Blood pressure (!) 126/53, pulse 68, temperature 98.5 F (36.9 C), temperature source Oral, resp. rate 20, height 5\' 4"  (1.626 m), weight 79.4 kg, SpO2 97 %.  PHYSICAL EXAMINATION:   GENERAL:  67 y.o.-year-old patient lying in the bed with no acute distress.  EYES: Pupils equal, round, reactive to light and accommodation. No scleral icterus. Extraocular muscles intact.  HEENT: Head atraumatic, normocephalic. Oropharynx and nasopharynx clear.  NECK:  Supple, no jugular venous distention. No thyroid enlargement, no tenderness.  LUNGS: Normal breath sounds bilaterally, no wheezing, rales,rhonchi or crepitation. No use of accessory muscles of respiration.  CARDIOVASCULAR: S1, S2 normal. No murmurs, rubs, or gallops.  ABDOMEN: Soft, nontender, nondistended. Bowel sounds present. No organomegaly or mass.  EXTREMITIES: No pedal edema, cyanosis, or clubbing.  Straight leg raising test is positive on both sides, right greater than left NEUROLOGIC: Cranial nerves II through XII are intact. Muscle strength 5/5 in all extremities. Sensation intact. Gait not checked.  PSYCHIATRIC: The patient is alert and oriented x 3.  SKIN: No obvious rash, lesion, or ulcer  DATA REVIEW:   CBC Recent Labs  Lab 03/03/18 0316  WBC 6.8  HGB 11.9*  HCT 34.7*  PLT 187    Chemistries  Recent Labs  Lab 03/02/18 2142 03/03/18 0316  NA 141 141  K 3.7 3.7  CL 110 111  CO2 27 25  GLUCOSE 113* 113*  BUN 25* 19  CREATININE 0.70 0.67  CALCIUM 8.5* 8.1*  AST 22  --   ALT 19  --   ALKPHOS 71  --   BILITOT 0.3  --      Microbiology Results  No results found for this or any previous visit.  RADIOLOGY:  Dg Chest 2 View  Result Date: 03/04/2018 CLINICAL DATA:  Hypoxia, recent fall with increased pain in lower back  that radiated into left hip, history of arthritis, asthma EXAM: CHEST - 2 VIEW COMPARISON:  03/02/2018 lumbar spine images FINDINGS: Heart size is mildly enlarged. There is opacity at the LEFT lung base, consistent pleural effusion and consolidation or significant atelectasis. Trace RIGHT pleural effusion is also present. There is no pulmonary edema. There is scoliosis of the thoracic spine associated with degenerative change. Stable appearance of wedge compression fracture at L1. IMPRESSION: LEFT pleural effusion and LEFT LOWER lobe consolidation and/or significant atelectasis. Cardiomegaly.  No pulmonary edema. Electronically Signed   By: Nolon Nations M.D.   On: 03/04/2018 08:51     Management plans discussed with the patient, family and they are in agreement.  CODE STATUS:     Code Status Orders  (From admission, onward)         Start     Ordered   03/03/18 0111  Full code  Continuous     03/03/18 0111        Code Status History    This patient has a current code status but no historical code status.    Advance Directive Documentation     Most Recent Value  Type of Advance Directive  Living will  Pre-existing out of facility DNR order (yellow form or pink MOST form)  -  "MOST" Form in Place?  -      TOTAL TIME TAKING CARE OF THIS PATIENT: 38 minutes.    Kynadi Dragos M.D on 03/04/2018 at 12:43 PM  Between 7am to 6pm - Pager - [phone]  After 6pm go to [email or web address] - password [email or web address] [facility]  [facility]  Office  [phone]  CC: Primary care physician; Hector Brunswick, MD   Note: This dictation was prepared with Dragon dictation along with smaller phrase technology. Any transcriptional errors that result from this process are unintentional.

## 2018-03-05 LAB — HIV ANTIBODY (ROUTINE TESTING W REFLEX): HIV Screen 4th Generation wRfx: NONREACTIVE

## 2018-03-06 NOTE — Care Management (Signed)
Received notice from [facility] that patient does not have a PCP and can not receive HHPT. [facility] is following up with patient.

## 2018-03-26 ENCOUNTER — Telehealth

## 2018-03-26 MED ORDER — ALPRAZOLAM 1 MG TAB
1 mg | ORAL_TABLET | Freq: Two times a day (BID) | ORAL | 3 refills | Status: DC
Start: 2018-03-26 — End: 2018-07-31

## 2018-03-26 NOTE — Telephone Encounter (Signed)
Ok per Dr Mercer PodGundi    Pt notified     Orders Placed This Encounter   ??? ALPRAZolam (XANAX) 1 mg tablet     Sig: Take 0.5 Tabs by mouth two (2) times a day. Max Daily Amount: 1 mg. Indications: anxious     Dispense:  30 Tab     Refill:  3     Per dr

## 2018-03-26 NOTE — Telephone Encounter (Signed)
-----   Message from ZHYQMVHQSiddesha Karena AddisonM Arashinagundi, MD sent at 03/26/2018  1:22 PM EDT -----  Molli Knockkay to refill, make sure we are not refilling early.  ----- Message -----  From: Hilaria OtaGillespie, Arrion Burruel E  Sent: 03/26/2018  10:32 AM EDT  To: Uvaldo BristleSiddesha M Arashinagundi, MD    Pt req Xanax refill

## 2018-04-16 ENCOUNTER — Encounter: Payer: MEDICARE | Attending: Specialist | Primary: Specialist

## 2018-04-16 ENCOUNTER — Encounter: Attending: Specialist | Primary: Specialist

## 2018-05-26 ENCOUNTER — Encounter

## 2018-05-27 MED ORDER — FLUOXETINE 20 MG CAP
20 mg | ORAL_CAPSULE | ORAL | 3 refills | Status: DC
Start: 2018-05-27 — End: 2019-03-04

## 2018-07-31 ENCOUNTER — Ambulatory Visit: Admit: 2018-07-31 | Discharge: 2018-07-31 | Payer: MEDICARE | Attending: Specialist | Primary: Specialist

## 2018-07-31 ENCOUNTER — Ambulatory Visit: Attending: Specialist | Primary: Specialist

## 2018-07-31 DIAGNOSIS — Z0000 Encounter for general adult medical examination without abnormal findings: Secondary | ICD-10-CM

## 2018-07-31 MED ORDER — ALPRAZOLAM 1 MG TAB
1 mg | ORAL_TABLET | Freq: Two times a day (BID) | ORAL | 3 refills | Status: DC
Start: 2018-07-31 — End: 2018-08-28

## 2018-07-31 MED ORDER — METOPROLOL SUCCINATE SR 50 MG 24 HR TAB
50 mg | ORAL_TABLET | Freq: Every day | ORAL | 3 refills | Status: AC
Start: 2018-07-31 — End: ?

## 2018-07-31 NOTE — Progress Notes (Signed)
This is the Subsequent Medicare Annual Wellness Exam, performed 12 months or more after the Initial AWV or the last Subsequent AWV    I have reviewed the patient's medical history in detail and updated the computerized patient record.     History     Patient Active Problem List   Diagnosis Code   ??? 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 Major depressive disorder with single episode, in partial remission (HCC) F32.4   ??? Asthma, moderate J45.909   ??? Osteoporosis M81.0   ??? History of rib fracture Z87.81   ??? Vitamin B 12 deficiency E53.8   ??? Chronic pain syndrome G89.4   ??? Arthralgia M25.50   ??? Chest pain, unspecified R07.9   ??? Essential hypertension, benign I10   ??? 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 Obstructive sleep apnea G47.33     Past Medical History:   Diagnosis Date   ??? Aorta disorder (HCC)     TYPE B   ??? Arthritis    ??? Asthma    ??? Chronic pain     Back   ??? Heart disease    ??? Osteoporosis    ??? Rib fractures     2009   ??? Vitamin B 12 deficiency    ??? Vitamin D deficiency       Past Surgical History:   Procedure Laterality Date   ??? HX BUNIONECTOMY     ??? HX TONSIL AND ADENOIDECTOMY       Current Outpatient Medications   Medication Sig Dispense Refill   ??? metoprolol succinate (TOPROL XL) 50 mg XL tablet Take 1 Tab by mouth daily. 90 Tab 3   ??? ALPRAZolam (XANAX) 1 mg tablet Take 0.5 Tabs by mouth two (2) times a day. Max Daily Amount: 1 mg. Indications: anxious 30 Tab 3   ??? FLUoxetine (PROZAC) 20 mg capsule TAKE 1 CAPSULE BY MOUTH  DAILY 90 Cap 3   ??? FLOVENT HFA 220 mcg/actuation inhaler Take 1 Puff by inhalation two (2) times a day. 1 Inhaler 11   ??? Syringe with Needle, Disp, (BD ECLIPSE LUER-LOK) 3 mL 25 x 5/8" syrg Inject 73m of B12 every 30 days IM 50 Syringe 2   ??? cyanocobalamin, vitamin B-12, 1,000 mcg/mL kit 1 mL by Injection route every month. 10 Kit 2   ??? gabapentin (NEURONTIN) 100 mg capsule Take 1 Cap by  mouth three (3) times daily. 90 Cap 2   ??? atorvastatin (LIPITOR) 10 mg tablet Take 1 Tab by mouth daily. 90 Tab 4   ??? FLUoxetine (PROZAC) 40 mg capsule Take 1 Cap by mouth daily. 90 Cap 2   ??? ergocalciferol (ERGOCALCIFEROL) 50,000 unit capsule Take 1 Cap by mouth every seven (7) days. 12 Cap 12   ??? PROAIR HFA 90 mcg/actuation inhaler INHALE 2 INHALATIONS INTO THE LUNGS EVERY 6 (SIX) HOURS AS NEEDED.  11   ??? cpap machine kit by Does Not Apply route.       Allergies   Allergen Reactions   ??? Aspirin Other (comments)     "Flu-like symptoms"   ??? Cymbalta [Duloxetine] Other (comments)     pain   ??? Elavil Other (comments)     pain   ??? Forteo [Teriparatide] Other (comments)   ??? Lyrica [Pregabalin] Other (comments)     Hallucinations and falling   ??? Prolia [Denosumab] Rash   ???  Topamax [Topiramate] Other (comments)     Slurred speach   ??? Ultram [Tramadol] Drowsiness       Family History   Problem Relation Age of Onset   ??? Cancer Mother    ??? Cancer Father    ??? Cancer Brother      Social History     Tobacco Use   ??? Smoking status: Never Smoker   ??? Smokeless tobacco: Never Used   Substance Use Topics   ??? Alcohol use: No       Depression Risk Factor Screening:     3 most recent PHQ Screens 07/11/2016   PHQ Not Done -   Little interest or pleasure in doing things Not at all   Feeling down, depressed, irritable, or hopeless Not at all   Total Score PHQ 2 0       Alcohol Risk Factor Screening:   Do you average 1 drink per night or more than 7 drinks a week:  No    On any one occasion in the past three months have you have had more than 3 drinks containing alcohol:  No      Functional Ability and Level of Safety:   Hearing: Hearing is good.    Activities of Daily Living:  The home contains: no safety equipment.  Patient does total self care    Ambulation: with no difficulty    Fall Risk:  Fall Risk Assessment, last 12 mths 07/31/2018   Able to walk? Yes   Fall in past 12 months? Yes   Fall with injury? Yes   Number of falls in past 12  months 2   Fall Risk Score 3       Abuse Screen:  Patient is not abused    Cognitive Screening   Has your family/caregiver stated any concerns about your memory: no  Cognitive Screening: Normal - CPCOG    Patient Care Team   Patient Care Team:  Hector Brunswick, MD as PCP - General (Internal Medicine)  Hector Brunswick, MD as PCP - [facility] Empaneled Provider  Nechama Guard, MD (Cardiology)  Teressa Lower, MD as Physician (Pulmonary Disease)    Assessment/Plan   Education and counseling provided:  Are appropriate based on today's review and evaluation  End-of-Life planning (with patient's consent)  Pneumococcal Vaccine  Influenza Vaccine  Hepatitis B Vaccine  Screening Mammography  Screening Pap and pelvic (covered once every 2 years)  Colorectal cancer screening tests  Cardiovascular screening blood test  Bone mass measurement (DEXA)  Screening for glaucoma  Diabetes screening test     General appearance - alert, well appearing, and in no distress    Mental status - alert, oriented to person, place, and time    Eyes - pupils equal and reactive, extraocular eye movements intact    Ears - bilateral TM's and external ear canals normal    Nose - normal and patent, no erythema, discharge or polyps    Neck - supple, no significant adenopathy, carotids upstroke normal bilaterally, no bruits, thyroid exam: thyroid is normal in size without nodules or tenderness    Throat / Mouth - no erythema, no tonsillar exudate, normal dental hygiene, no ulcers    Chest - clear to auscultation, no wheezes, rales or rhonchi, symmetric air entry    Heart - normal rate and regular rhythm, S1 and S2 normal, no gallops noted, no murmur    Abdomen - soft, nontender, nondistended, no masses or organomegaly  Back exam - full range of motion, no tenderness, palpable spasm or pain.    Neurological - alert, oriented, normal speech, no focal findings or movement disorder noted    Musculoskeletal - no joint tenderness,  deformity or swelling    Extremities - peripheral pulses normal, no pedal edema, no clubbing or cyanosis    Skin - normal coloration and turgor, no rashes, no suspicious skin lesions noted      Diagnoses and all orders for this visit:    1. Medicare annual wellness visit, subsequent    2. GAD (generalized anxiety disorder)  -     ALPRAZolam (XANAX) 1 mg tablet; Take 0.5 Tabs by mouth two (2) times a day. Max Daily Amount: 1 mg. Indications: anxious    3. Obstructive sleep apnea  -     ALPRAZolam (XANAX) 1 mg tablet; Take 0.5 Tabs by mouth two (2) times a day. Max Daily Amount: 1 mg. Indications: anxious    4. Aortic dissection distal to left subclavian (HCC)  -     ALPRAZolam (XANAX) 1 mg tablet; Take 0.5 Tabs by mouth two (2) times a day. Max Daily Amount: 1 mg. Indications: anxious    5. Recurrent major depressive disorder, in full remission ([place])    6. Vitamin B 12 deficiency    7. Vitamin D deficiency    8. Pure hypercholesterolemia    9. 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        Health Maintenance Due   Topic Date Due   ??? Shingrix Vaccine Age 66> (1 of 2) 10/16/2002   ??? MEDICARE YEARLY EXAM  02/14/2018     Status post EGD, still has fair amount of epigastric discomfort, patient on Protonix not much of any help, discussed importance of diet, given information on the FODMAP diet, change to Pepcid/Prilosec, DC Protonix, we will see her back in 4 weeks

## 2018-07-31 NOTE — Patient Instructions (Addendum)
Medicare Wellness Visit, Female     The best way to live healthy is to have a lifestyle where you eat a well-balanced diet, exercise regularly, limit alcohol use, and quit all forms of tobacco/nicotine, if applicable.     Regular preventive services are another way to keep healthy. Preventive services (vaccines, screening tests, monitoring & exams) can help personalize your care plan, which helps you manage your own care. Screening tests can find health problems at the earliest stages, when they are easiest to treat.   [facility] follows the current, evidence-based guidelines published by the [place] [employer] (USPSTF) when recommending preventive services for our patients. Because we follow these guidelines, sometimes recommendations change over time as research supports it. (For example, mammograms used to be recommended annually. Even though Medicare will still pay for an annual mammogram, the newer guidelines recommend a mammogram every two years for women of average risk).  Of course, you and your doctor may decide to screen more often for some diseases, based on your risk and your co-morbidities (chronic disease you are already diagnosed with).     Preventive services for you include:  - Medicare offers their members a free annual wellness visit, which is time for you and your primary care provider to discuss and plan for your preventive service needs. Take advantage of this benefit every year!  -All adults over the age of 45 should receive the recommended pneumonia vaccines. Current USPSTF guidelines recommend a series of two vaccines for the best pneumonia protection.   -All adults should have a flu vaccine yearly and a tetanus vaccine every 10 years.   -All adults age 5 and older should receive the shingles vaccines (series of two vaccines).      -All adults age 53-70 who are overweight should have a diabetes screening test once every three years.    -All adults born between [birth year] and 1963 should be screened once for Hepatitis C.  -Other screening tests and preventive services for persons with diabetes include: an eye exam to screen for diabetic retinopathy, a kidney function test, a foot exam, and stricter control over your cholesterol.   -Cardiovascular screening for adults with routine risk involves an electrocardiogram (ECG) at intervals determined by your doctor.   -Colorectal cancer screenings should be done for adults age 36-75 with no increased risk factors for colorectal cancer.  There are a number of acceptable methods of screening for this type of cancer. Each test has its own benefits and drawbacks. Discuss with your doctor what is most appropriate for you during your annual wellness visit. The different tests include: colonoscopy (considered the best screening method), a fecal occult blood test, a fecal DNA test, and sigmoidoscopy.    -A bone mass density test is recommended when a woman turns 65 to screen for osteoporosis. This test is only recommended one time, as a screening. Some providers will use this same test as a disease monitoring tool if you already have osteoporosis.  -Breast cancer screenings are recommended every other year for women of normal risk, age 73-74.  -Cervical cancer screenings for women over age 53 are only recommended with certain risk factors.     Here is a list of your current Health Maintenance items (your personalized list of preventive services) with a due date:  Health Maintenance Due   Topic Date Due   ??? Shingles Vaccine (1 of 2) 10/16/2002   ??? Annual Well Visit  02/14/2018  Learning About the Low FODMAP Diet for Irritable Bowel Syndrome (IBS)  What is the low-FODMAP diet?  A low-FODMAP diet is a way to find out what foods give you digestion problems. You stop eating certain high-FODMAP foods for about 2 months. Then you add them back to see how your body reacts.   This is called a "challenge diet." A dietitian or doctor can help you follow this diet.  FODMAPs are carbohydrates. They are in many types of foods. FODMAP stands for:  ?? F ermentable.  ?? O ligosaccharides.  ?? D isaccharides.  ?? M onosaccharides.  ?? A nd p olyols.  If you have digestive problems, some of these foods can make your symptoms worse. When you are on this diet, you can still eat certain fruits and vegetables. You can also eat certain grains, meats, fish, and lactose-free milks.  What is it used for?  If you have irritable bowel syndrome (IBS), you can ease your symptoms by not eating some types of foods. Some people also use this diet for inflammatory bowel disease (IBD) or some food intolerances.  High-FODMAP foods can be hard to digest. They pull more fluid into your intestines. They are also easily fermented. This can lead to bloating, belly pain, gas, and diarrhea.  The low-FODMAP diet can help you figure out what foods to avoid. And it can help you find foods that are easier to digest.  This diet can help with symptoms of some digestive diseases. But it's not a cure. You will still need to manage your condition.  How does it work?  You will work with a doctor or dietitian when you start the diet.  At first, you won't eat any high-FODMAP foods for a few weeks.  Go to www.monashfodmap.com to learn more about this diet. You'll also find links to an app for your phone or other device. You'll find low-FODMAP cookbooks there too.  After 6 to 8 weeks, you will start to try high-FODMAP foods again. You will add those foods back to your diet, one group at a time. Your doctor or dietitian will probably have you wait a few days before you add each new group of those foods.  Keep a food diary. You can write down the foods you try and note how they make you feel.  After a few weeks, you may have a better idea of what foods you should avoid and what foods make you feel your best.  What are the risks?   There is some risk of not getting all of the vitamins and nutrients you need on the low-FODMAP diet. These include:  ?? Folate.  ?? Thiamin.  ?? Vitamin B6.  ?? Calcium.  ?? Vitamin D.  Your dietitian or doctor can help you find other sources of these if needed.  This diet may limit your fiber intake. Try to plan your meals to include other sources of fiber.  What foods are on the low-FODMAP diet?  Here is a guide to foods that you can eat, plus the foods that you should avoid, when you are on the low-FODMAP diet.  Grains  Okay to eat: Foods made from grains like arrowroot, buckwheat, corn, millet, and oats. You can also eat potato, quinoa, rice, sorghum, tapioca, and teff. Cereals, pasta, breads, corn tortillas and baked goods made from these grains are also okay. (These grains may be labeled "gluten-free.")  Avoid: Grains like wheat, barley, and rye. Avoid ingredients such as bulgur, couscous, durum, and  semolina. And avoid cereals, breads, and pastas made from these grains. Avoid chickpea, lentil, and pea flour.  Proteins  Okay to eat: Most meat, fish, and eggs without high-FODMAP sauces. You can have small amounts of almonds or hazelnuts (10 nuts). Macadamia nuts, peanuts, pecans, pine nuts, and walnuts are also okay. You can also eat chia and pumpkin seeds, tofu, and tempeh.  Avoid: Beans, chickpeas, lentils, and soybeans. Avoid pistachio and cashew nuts. And some sausages may have high-FODMAP ingredients.  Dairy  Okay to eat: Lactose-free dairy milks. Rice milk and almond milk are okay. So are lactose-free yogurts, kefirs, ice creams, and sorbet from low-FODMAP fruits and sweeteners. (These are often labeled "lactose-free.") You can have small amounts (2 Tbsp) of cottage, cream, or ricotta cheese. Hard cheeses like cheddar, [place], [place], and Swiss are okay. So are small amounts (1 oz) of aged or ripened cheeses like Brie, blue, and feta.  Avoid: Milk, including cow, goat, and sheep. Avoid condensed or evaporated  milk, buttermilk, custard, cream, sour cream, yogurt, and ice cream. Avoid soy milk. (Check sauces for dairy ingredients.)  Vegetables  Okay to eat: Bamboo shoots, bell peppers, bok choy, up to ?? cup of broccoli or cabbage (red or white), and cucumbers. Eggplant, green beans, lettuce, olives, parsnips, and potatoes are okay to eat. So are pumpkin, rutabaga, seaweed, sprouts, Swiss chard, and spinach. You can eat scallions (green part only) and squash (not butternut). You can eat tomatoes, turnips, watercress, yams, and zucchini. You can also have small amounts of artichoke hearts (from can, 1 oz), carrots, corn (?? cob), and sweet potato (?? cup).  Avoid: Artichokes, asparagus, Brussels sprouts, savoy cabbage, cauliflower, and celery. And avoid garlic, leeks, mushrooms, okra, onions, scallions (white part), shallots, and peas.  Fruits  Okay to eat: Bananas, blueberries, cantaloupe, coconut, grapes, and honeydew. Kiwi, lemons, limes, oranges, passion fruit, papaya, and pineapple are also okay. You can eat plantain, raspberries, rhubarb, star fruit, strawberries, tangelo, and tangerine. You can also have small amounts of dried banana chips (up to 10 chips), dried cranberries (1 Tbsp), and shredded coconut (up to ?? cup).  Avoid: Apples, applesauce, apricots, avocados, blackberries, boysenberries, and cherries. Also avoid dates, figs, grapefruit, guava, lychee, and mangoes. Don't eat nectarines, peaches, pears, persimmon, plums, prunes, tam[place], or watermelon. And limit most canned and dried fruits.  Oils, spices, condiments, and sweeteners  Okay to eat: Vegetable oils (including garlic infused), butter, ghee, lard, and margarine (no trans fat). You can have most fresh herbs like basil, chives, coriander, ginger, parsley, rosemary and thyme. You can have salt, jams made from low-FODMAP fruits, mayonnaise, and mustard. Soy sauce, hot sauce (no garlic), tamari, and vinegar are also okay.  Sweeteners that are okay include sugar (sucrose), powdered (confectioner's) sugar, brown sugar, glucose, and maple syrup. You can also have some artificial sweeteners like aspartame, saccharine, and stevia.  Avoid: Chutneys, hummus, jellies, garlic sauces, and gravies made with onion or garlic. Avoid pickles, relish, some salad dressings and soup stocks, salsa, and tomato paste. And avoid sauces and other foods with high fructose corn syrup, honey, molasses, and agave. Avoid artificial sweeteners (isomalt, mannitol, malitol, sorbitol, and xylitol). Avoid corn syrup solids, fructose, fruit juice concentrate, and polydextrose.  Other foods and drinks  Okay to have: Water, soda water, tonic, soft drinks sweetened with sugar, ?? cup of low-FODMAP fruit juice, and most teas and alcohols. You can also eat foods made with baking powder and soda, cocoa, and gelatin.  Avoid: Juices from high-FODMAP  fruits and vegetables. And avoid fortified wines, chamomile and fennel teas, chicory-based drinks and coffee substitutes, and bouillon cubes.  Follow-up care is a key part of your treatment and safety. Be sure to make and go to all appointments, and call your doctor if you are having problems. It's also a good idea to know your test results and keep a list of the medicines you take.  Where can you learn more?  Go to http://www.healthwise.net/[facility].  Enter L235 in the search box to learn more about "Learning About the Low FODMAP Diet for Irritable Bowel Syndrome (IBS)."  Current as of: May 16, 2017  Content Version: 12.2  ?? 2006-2019 Healthwise[employer] Incorporated. Care instructions adapted under license by [employer] [facility] (which disclaims liability or warranty for this information). If you have questions about a medical condition or this instruction, always ask your healthcare professional. Healthwise, Incorporated disclaims any warranty or liability for your use of this information.

## 2018-07-31 NOTE — Progress Notes (Signed)
This is the Subsequent Medicare Annual Wellness Exam, performed 12 months or more after the Initial AWV or the last Subsequent AWV    I have reviewed the patient's medical history in detail and updated the computerized patient record.     History     Patient Active Problem List   Diagnosis Code   ??? 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 Major depressive disorder with single episode, in partial remission (HCC) F32.4   ??? Asthma, moderate J45.909   ??? Osteoporosis M81.0   ??? History of rib fracture Z87.81   ??? Vitamin B 12 deficiency E53.8   ??? Chronic pain syndrome G89.4   ??? Arthralgia M25.50   ??? Chest pain, unspecified R07.9   ??? Essential hypertension, benign I10   ??? 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 Obstructive sleep apnea G47.33     Past Medical History:   Diagnosis Date   ??? Aorta disorder (HCC)     TYPE B   ??? Arthritis    ??? Asthma    ??? Chronic pain     Back   ??? Heart disease    ??? Osteoporosis    ??? Rib fractures     2009   ??? Vitamin B 12 deficiency    ??? Vitamin D deficiency       Past Surgical History:   Procedure Laterality Date   ??? HX BUNIONECTOMY     ??? HX TONSIL AND ADENOIDECTOMY       Current Outpatient Medications   Medication Sig Dispense Refill   ??? metoprolol succinate (TOPROL XL) 50 mg XL tablet Take 1 Tab by mouth daily. 90 Tab 3   ??? ALPRAZolam (XANAX) 1 mg tablet Take 0.5 Tabs by mouth two (2) times a day. Max Daily Amount: 1 mg. Indications: anxious 30 Tab 3   ??? FLUoxetine (PROZAC) 20 mg capsule TAKE 1 CAPSULE BY MOUTH  DAILY 90 Cap 3   ??? FLOVENT HFA 220 mcg/actuation inhaler Take 1 Puff by inhalation two (2) times a day. 1 Inhaler 11   ??? Syringe with Needle, Disp, (BD ECLIPSE LUER-LOK) 3 mL 25 x 5/8" syrg Inject 21m of B12 every 30 days IM 50 Syringe 2   ??? cyanocobalamin, vitamin B-12, 1,000 mcg/mL kit 1 mL by Injection route every month. 10 Kit 2    ??? gabapentin (NEURONTIN) 100 mg capsule Take 1 Cap by mouth three (3) times daily. 90 Cap 2   ??? atorvastatin (LIPITOR) 10 mg tablet Take 1 Tab by mouth daily. 90 Tab 4   ??? FLUoxetine (PROZAC) 40 mg capsule Take 1 Cap by mouth daily. 90 Cap 2   ??? ergocalciferol (ERGOCALCIFEROL) 50,000 unit capsule Take 1 Cap by mouth every seven (7) days. 12 Cap 12   ??? PROAIR HFA 90 mcg/actuation inhaler INHALE 2 INHALATIONS INTO THE LUNGS EVERY 6 (SIX) HOURS AS NEEDED.  11   ??? cpap machine kit by Does Not Apply route.       Allergies   Allergen Reactions   ??? Aspirin Other (comments)     "Flu-like symptoms"   ??? Cymbalta [Duloxetine] Other (comments)     pain   ??? Elavil Other (comments)     pain   ??? Forteo [Teriparatide] Other (comments)   ??? Lyrica [Pregabalin] Other (comments)     Hallucinations and falling   ??? Prolia [Denosumab] Rash   ???  Topamax [Topiramate] Other (comments)     Slurred speach   ??? Ultram [Tramadol] Drowsiness       Family History   Problem Relation Age of Onset   ??? Cancer Mother    ??? Cancer Father    ??? Cancer Brother      Social History     Tobacco Use   ??? Smoking status: Never Smoker   ??? Smokeless tobacco: Never Used   Substance Use Topics   ??? Alcohol use: No       Depression Risk Factor Screening:     3 most recent PHQ Screens 07/11/2016   PHQ Not Done -   Little interest or pleasure in doing things Not at all   Feeling down, depressed, irritable, or hopeless Not at all   Total Score PHQ 2 0       Alcohol Risk Factor Screening:   Do you average 1 drink per night or more than 7 drinks a week:  No    On any one occasion in the past three months have you have had more than 3 drinks containing alcohol:  No      Functional Ability and Level of Safety:   Hearing: Hearing is good.    Activities of Daily Living:  The home contains: no safety equipment.  Patient does total self care    Ambulation: with no difficulty    Fall Risk:  Fall Risk Assessment, last 12 mths 07/31/2018   Able to walk? Yes    Fall in past 12 months? Yes   Fall with injury? Yes   Number of falls in past 12 months 2   Fall Risk Score 3       Abuse Screen:  Patient is not abused    Cognitive Screening   Has your family/caregiver stated any concerns about your memory: no  Cognitive Screening: Normal - [facility]PCOG    Patient Care Team   Patient Care Team:  Hector Brunswick, MD as PCP - General (Internal Medicine)  Hector Brunswick, MD as PCP - [facility] Empaneled Provider  Nechama Guard, MD (Cardiology)  Teressa Lower, MD as Physician (Pulmonary Disease)    Assessment/Plan   Education and counseling provided:  Are appropriate based on today's review and evaluation  End-of-Life planning (with patient's consent)  Pneumococcal Vaccine  Influenza Vaccine  Hepatitis B Vaccine  Screening Mammography  Screening Pap and pelvic (covered once every 2 years)  Colorectal cancer screening tests  Cardiovascular screening blood test  Bone mass measurement (DEXA)  Screening for glaucoma  Diabetes screening test     General appearance - alert, well appearing, and in no distress    Mental status - alert, oriented to person, place, and time    Eyes - pupils equal and reactive, extraocular eye movements intact    Ears - bilateral TM's and external ear canals normal    Nose - normal and patent, no erythema, discharge or polyps    Neck - supple, no significant adenopathy, carotids upstroke normal bilaterally, no bruits, thyroid exam: thyroid is normal in size without nodules or tenderness    Throat / Mouth - no erythema, no tonsillar exudate, normal dental hygiene, no ulcers    Chest - clear to auscultation, no wheezes, rales or rhonchi, symmetric air entry    Heart - normal rate and regular rhythm, S1 and S2 normal, no gallops noted, no murmur    Abdomen - soft, nontender, nondistended, no masses or organomegaly  Back exam - full range of motion, no tenderness, palpable spasm or pain.     Neurological - alert, oriented, normal speech, no focal findings or movement disorder noted    Musculoskeletal - no joint tenderness, deformity or swelling    Extremities - peripheral pulses normal, no pedal edema, no clubbing or cyanosis    Skin - normal coloration and turgor, no rashes, no suspicious skin lesions noted      Diagnoses and all orders for this visit:    1. Medicare annual wellness visit, subsequent    2. GAD (generalized anxiety disorder)  -     ALPRAZolam (XANAX) 1 mg tablet; Take 0.5 Tabs by mouth two (2) times a day. Max Daily Amount: 1 mg. Indications: anxious    3. Obstructive sleep apnea  -     ALPRAZolam (XANAX) 1 mg tablet; Take 0.5 Tabs by mouth two (2) times a day. Max Daily Amount: 1 mg. Indications: anxious    4. Aortic dissection distal to left subclavian (HCC)  -     ALPRAZolam (XANAX) 1 mg tablet; Take 0.5 Tabs by mouth two (2) times a day. Max Daily Amount: 1 mg. Indications: anxious    5. Recurrent major depressive disorder, in full remission ([place])    6. Vitamin B 12 deficiency    7. Vitamin D deficiency    8. Pure hypercholesterolemia    9. 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        Health Maintenance Due   Topic Date Due   ??? Shingrix Vaccine Age 58> (1 of 2) 10/16/2002   ??? MEDICARE YEARLY EXAM  02/14/2018     Status post EGD, still has fair amount of epigastric discomfort, patient on Protonix not much of any help, discussed importance of diet, given information on the FODMAP diet, change to Pepcid/Prilosec, DC Protonix, we will see her back in 4 weeks

## 2018-08-28 ENCOUNTER — Ambulatory Visit: Admit: 2018-08-28 | Discharge: 2018-08-28 | Payer: MEDICARE | Attending: Specialist | Primary: Specialist

## 2018-08-28 ENCOUNTER — Ambulatory Visit: Attending: Specialist | Primary: Specialist

## 2018-08-28 DIAGNOSIS — E538 Deficiency of other specified B group vitamins: Secondary | ICD-10-CM

## 2018-08-28 NOTE — Progress Notes (Signed)
[facility]  Campbell Riches, M.D.  Sudhirkumar C. Posey Pronto, M.D.  [address]  [place], [place]  Ph No:  [phone]  Fax:  [phone]      Chief Complaint   Patient presents with   ??? Follow Up Chronic Condition     Stress and anxiety        History of Present Illness:  Suzanne Pham is a 68 y.o. female that presents today for follow-up, mild worsening of anxiety symptoms, off of Xanax 2 weeks, patient had a withdrawal when she was in [place], seems to be doing well, taking OTC CBD oil, still has mild depressed symptoms/excessive anxiety symptoms, no recent panic attack,    Left-sided rib pain pain, chronic, secondary to previous injury, stable at this time,    Asthma/eosinophilic pneumonia, followed by pulmonology at [facility], stable, patient not using her CPAP on regular basis,    On vitamin D supplementation,    Postmenopausal, discussed preventive screening/immunization,    Blood pressure well controlled, patient tolerating Toprol-XL well.        Allergies   Allergen Reactions   ??? Aspirin Other (comments)     "Flu-like symptoms"   ??? Cymbalta [Duloxetine] Other (comments)     pain   ??? Elavil Other (comments)     pain   ??? Forteo [Teriparatide] Other (comments)   ??? Lyrica [Pregabalin] Other (comments)     Hallucinations and falling   ??? Prolia [Denosumab] Rash   ??? Topamax [Topiramate] Other (comments)     Slurred speach   ??? Ultram [Tramadol] Drowsiness     Past Medical History:   Diagnosis Date   ??? Aorta disorder (HCC)     TYPE B   ??? Arthritis    ??? Asthma    ??? Chronic pain     Back   ??? Heart disease    ??? Osteoporosis    ??? Rib fractures     2009   ??? Vitamin B 12 deficiency    ??? Vitamin D deficiency      Past Surgical History:   Procedure Laterality Date   ??? HX BUNIONECTOMY     ??? HX TONSIL AND ADENOIDECTOMY       Family History   Problem Relation Age of Onset   ??? Cancer Mother    ??? Cancer Father    ??? Cancer Brother      Social History     Socioeconomic History   ???  Marital status: MARRIED     Spouse name: Not on file   ??? Number of children: Not on file   ??? Years of education: Not on file   ??? Highest education level: Not on file   Occupational History   ??? Not on file   Social Needs   ??? Financial resource strain: Not on file   ??? Food insecurity:     Worry: Not on file     Inability: Not on file   ??? Transportation needs:     Medical: Not on file     Non-medical: Not on file   Tobacco Use   ??? Smoking status: Never Smoker   ??? Smokeless tobacco: Never Used   Substance and Sexual Activity   ??? Alcohol use: No   ??? Drug use: No   ??? Sexual activity: Yes     Partners: Male     Birth control/protection: None   Lifestyle   ??? Physical activity:  Days per week: Not on file     Minutes per session: Not on file   ??? Stress: Not on file   Relationships   ??? Social connections:     Talks on phone: Not on file     Gets together: Not on file     Attends religious service: Not on file     Active member of club or organization: Not on file     Attends meetings of clubs or organizations: Not on file     Relationship status: Not on file   ??? Intimate partner violence:     Fear of current or ex partner: Not on file     Emotionally abused: Not on file     Physically abused: Not on file     Forced sexual activity: Not on file   Other Topics Concern   ??? Not on file   Social History Narrative   ??? Not on file     Current Outpatient Medications   Medication Sig Dispense Refill   ??? gabapentin (NEURONTIN) 100 mg capsule Take  by mouth three (3) times daily.     ??? metoprolol succinate (TOPROL XL) 50 mg XL tablet Take 1 Tab by mouth daily. 90 Tab 3   ??? pantoprazole (PROTONIX) 40 mg tablet Take 40 mg by mouth daily.     ??? FLUoxetine (PROZAC) 20 mg capsule TAKE 1 CAPSULE BY MOUTH  DAILY 90 Cap 3   ??? FLOVENT HFA 220 mcg/actuation inhaler Take 1 Puff by inhalation two (2) times a day. 1 Inhaler 11   ??? Syringe with Needle, Disp, (BD ECLIPSE LUER-LOK) 3 mL 25 x 5/8" syrg Inject 81m of B12 every 30 days IM 50 Syringe 2    ??? cyanocobalamin, vitamin B-12, 1,000 mcg/mL kit 1 mL by Injection route every month. 10 Kit 2   ??? atorvastatin (LIPITOR) 10 mg tablet Take 1 Tab by mouth daily. 90 Tab 4   ??? FLUoxetine (PROZAC) 40 mg capsule Take 1 Cap by mouth daily. 90 Cap 2   ??? ergocalciferol (ERGOCALCIFEROL) 50,000 unit capsule Take 1 Cap by mouth every seven (7) days. 12 Cap 12   ??? PROAIR HFA 90 mcg/actuation inhaler INHALE 2 INHALATIONS INTO THE LUNGS EVERY 6 (SIX) HOURS AS NEEDED.  11   ??? cpap machine kit by Does Not Apply route.           REVIEW OF SYSTEMS:    GENERAL: negative for - chills, fatigue, fever, hot flashes, malaise, night sweats, sleep disturbance, weight gain or weight loss.    EYES: negative for  - blurred vision, double vision, photophobia, pain, discharge and redness.    ENT AND MOUTH: negative for - epistaxis, headaches, hearing change, nasal   discharge, nasal polyps, oral lesions, facial pain,, sneezing, sore throat, tinnitus, vertigo, visual changes or vocal changes.    CARDIOVASCULAR: negative for - chest pain, dyspnea on exertion, pedal edema, irregular heartbeat, loss of consciousness,  orthopnea, palpitations, paroxysmal nocturnal dyspnea, resting shortness of breath.    RESPIRATORY:  negative for - cough wheezing, hemoptysis, orthopnea, pleuritic pain, shortness of breath, sputum changes,  tachypnea     GASTROINTESTINAL: negative for - abdominal pain, appetite loss, blood in stools, change in bowel habits, change in stools, constipation, diarrhea, gas/bloating, heartburn, hematemesis, melena, nausea/vomiting, stool incontinence or swallowing difficulty    GENITOURINARY FEMALE: negative for -change in menstrual cycle, change in urinary stream, dysmenorrhea, dyspareunia, dysuria, genital discharge, genital ulcers, hematuria, incontinence, irregular/heavy menses, pelvic  pain, urinary frequency/urgency and decrease in libido     NEUROLOGICAL: negative for - behavioral changes, bowel and bladder control changes,  confusion, dizziness, gait disturbance, headaches, sensory changes, motor weakness, seizures, speech problems, tremors, visual changes or weakness    PSYCHIATRIC: negative for -, behavioral disorder, concentration difficulties, , depressive symptoms, disorientation, hallucinations, delusions, irritability, memory difficulties, mood swings, obsessive thoughts, physical abuse, sleep disturbances or suicidal ideation.    MUSCULOSKELETAL: negative for - gait disturbance, joint pain, joint stiffness, joint swelling, muscle pain, muscular weakness,     INTEGUMENTARY (BREASTS): negative for - a breast mass, a palpable lump in the breast, breast pain, nipple discharge     ENDOCRINE: negative for polydipsia, polyuria, polydypsia, cold-heat intolerance,  hot flashes,    HEM/LYMPH: negative for anemia, bruising, bleeding, lymph node enlargement,     ALLERGY/IMMUNOLOGIC: negative for allergies to medicine, food, dyes, hepatitis,    PHYSICAL EXAM  General appearance - alert, well appearing, and in no distress    Mental status - alert, oriented to person, place, and time    Eyes - pupils equal and reactive, extraocular eye movements intact    Ears - bilateral TM's and external ear canals normal    Nose - normal and patent, no erythema, discharge or polyps    Neck - supple, no significant adenopathy, carotids upstroke normal bilaterally, no bruits, thyroid exam: thyroid is normal in size without nodules or tenderness    Throat / Mouth - no erythema, no tonsillar exudate, normal dental hygiene, no ulcers    Chest - clear to auscultation, no wheezes, rales or rhonchi, symmetric air entry    Heart - normal rate and regular rhythm, S1 and S2 normal, no gallops noted, no murmur    Abdomen - soft, nontender, nondistended, no masses or organomegaly    Back exam - full range of motion, no tenderness, palpable spasm or pain.    Neurological - alert, oriented, normal speech, no focal findings or movement disorder noted    Musculoskeletal -  no joint tenderness, deformity or swelling    Extremities - peripheral pulses normal, no pedal edema, no clubbing or cyanosis    Skin - normal coloration and turgor, no rashes, no suspicious skin lesions noted    Immunization History   Administered Date(s) Administered   ??? Hep B Vaccine 10/18/1993   ??? Influenza Vaccine 10/18/2009, 04/09/2014, 03/22/2015, 04/09/2016   ??? Influenza Vaccine (Quad) PF 04/09/2017   ??? Influenza Vaccine PF 06/05/2011, 04/03/2012   ??? Pneumococcal Conjugate (PCV-13) 02/13/2017   ??? Pneumococcal Polysaccharide (PPSV-23) 07/10/2010, 05/02/2014, 10/15/2014   ??? Tdap 10/19/2007, 10/15/2014          LABS:   Results for orders placed or performed in visit on 11/12/17   LIPID PANEL   Result Value Ref Range    Cholesterol, total 130 100 - 199 mg/dL    Triglyceride 156 (H) 0 - 149 mg/dL    HDL Cholesterol 44 >39 mg/dL    VLDL, calculated 31 5 - 40 mg/dL    LDL, calculated 55 0 - 99 mg/dL   METABOLIC PANEL, COMPREHENSIVE   Result Value Ref Range    Glucose 84 65 - 99 mg/dL    BUN 16 8 - 27 mg/dL    Creatinine 0.76 0.57 - 1.00 mg/dL    GFR est non-AA 83 >59 mL/min/1.73    GFR est AA 95 >59 mL/min/1.73    BUN/Creatinine ratio 21 12 - 28    Sodium 142 134 -  144 mmol/L    Potassium 4.2 3.5 - 5.2 mmol/L    Chloride 107 (H) 96 - 106 mmol/L    CO2 24 20 - 29 mmol/L    Calcium 9.6 8.7 - 10.3 mg/dL    Protein, total 5.9 (L) 6.0 - 8.5 g/dL    Albumin 4.0 3.6 - 4.8 g/dL    GLOBULIN, TOTAL 1.9 1.5 - 4.5 g/dL    A-G Ratio 2.1 1.2 - 2.2    Bilirubin, total 0.4 0.0 - 1.2 mg/dL    Alk. phosphatase 79 39 - 117 IU/L    AST (SGOT) 14 0 - 40 IU/L    ALT (SGPT) 15 0 - 32 IU/L   TSH 3RD GENERATION   Result Value Ref Range    TSH 2.070 0.450 - 4.500 uIU/mL   VITAMIN D, 25 HYDROXY   Result Value Ref Range    VITAMIN D, 25-HYDROXY 48.5 30.0 - 100.0 ng/mL   HEPATITIS C AB   Result Value Ref Range    Hep C Virus Ab <0.1 0.0 - 0.9 s/co ratio   AMB POC COMPLETE CBC,AUTOMATED ENTER   Result Value Ref Range    WBC (POC) 6.8 4.5 - 10.5  10^3/ul    LYMPHOCYTES (POC) 37.0 20.5 - 51.1 %    MONOCYTES (POC) 5.4 1.7 - 9.3 %    GRANULOCYTES (POC) 57.6 42.2 - 75.2 %    ABS. LYMPHS (POC) 2.5 1.2 - 3.4 10^3/ul    ABS. MONOS (POC) 0.4 0.1 - 0.6 10^3/ul    ABS. GRANS (POC) 3.9 1.4 - 6.5 10^3/ul    RBC (POC) 4.40 4 - 6 10^6/ul    HGB (POC) 13.4 11 - 18 g/dL    HCT (POC) 39.7 35 - 60 %    MCV (POC) 90.3 80 - 99.9 fL    MCH (POC) 30.6 27 - 31 pg    MCHC (POC) 33.9 33 - 37 g/dL    RDW (POC) 13.8 (A) 11.6 - 13.7 %    PLATELET (POC) 228 150 - 450 10^3/ul    MPV (POC) 9.4 7.8 - 11 fL   AMB POC URINALYSIS DIP STICK AUTO W/O MICRO   Result Value Ref Range    Color (UA POC) Yellow     Clarity (UA POC) Clear     Glucose (UA POC) Negative Negative mg/dL    Bilirubin (UA POC) Negative Negative    Ketones (UA POC) Negative Negative    Specific gravity (UA POC) 1.015 1.001 - 1.035    Blood (UA POC) Negative Negative    pH (UA POC) 5.5 4.6 - 8.0    Protein (UA POC) Negative Negative    Urobilinogen (UA POC) 0.2 mg/dL 0.2 - 1    Nitrites (UA POC) Negative Negative    Leukocyte esterase (UA POC) Negative Negative   AMB POC FECAL BLOOD, OCCULT, QL 3 CARDS   Result Value Ref Range    VALID INTERNAL CONTROL POC Yes     Hemoccult (POC) Negative Negative    Occult Blood-2 (POC) Negative     Occult blood-3 (POC) Negative          ASSESSMENT AND PLAN  Diagnoses and all orders for this visit:    1. Vitamin B 12 deficiency    2. Vitamin D deficiency    3. Pure hypercholesterolemia    4. GAD (generalized anxiety disorder)    5. Benzodiazepine withdrawal with complication (HCC)    6. Rib pain    7.  Recurrent major depressive disorder, in full remission ([place])        Continue B12/vitamin D supplementation,    Stable benzodiazepine withdrawal, off of Xanax, increase fluoxetine to 40 mg p.o. daily, we will see her back in 2 to 3 weeks,    Continue Flovent as directed, stable asthma symptoms,    GERD stable tolerating Protonix well.      [id number] Theodoro Kos, MD

## 2018-08-28 NOTE — Progress Notes (Signed)
[facility]  Campbell Riches, M.D.  Sudhirkumar C. Posey Pronto, M.D.  [address]  [place], [place]  Ph No:  [phone]  Fax:  [phone]      Chief Complaint   Patient presents with   ??? Follow Up Chronic Condition     Stress and anxiety        History of Present Illness:  Suzanne Pham is a 68 y.o. female that presents today for follow-up, mild worsening of anxiety symptoms, off of Xanax 2 weeks, patient had a withdrawal when she was in [place], seems to be doing well, taking OTC CBD oil, still has mild depressed symptoms/excessive anxiety symptoms, no recent panic attack,    Left-sided rib pain pain, chronic, secondary to previous injury, stable at this time,    Asthma/eosinophilic pneumonia, followed by pulmonology at [facility], stable, patient not using her CPAP on regular basis,    On vitamin D supplementation,    Postmenopausal, discussed preventive screening/immunization,    Blood pressure well controlled, patient tolerating Toprol-XL well.        Allergies   Allergen Reactions   ??? Aspirin Other (comments)     "Flu-like symptoms"   ??? Cymbalta [Duloxetine] Other (comments)     pain   ??? Elavil Other (comments)     pain   ??? Forteo [Teriparatide] Other (comments)   ??? Lyrica [Pregabalin] Other (comments)     Hallucinations and falling   ??? Prolia [Denosumab] Rash   ??? Topamax [Topiramate] Other (comments)     Slurred speach   ??? Ultram [Tramadol] Drowsiness     Past Medical History:   Diagnosis Date   ??? Aorta disorder (HCC)     TYPE B   ??? Arthritis    ??? Asthma    ??? Chronic pain     Back   ??? Heart disease    ??? Osteoporosis    ??? Rib fractures     2009   ??? Vitamin B 12 deficiency    ??? Vitamin D deficiency      Past Surgical History:   Procedure Laterality Date   ??? HX BUNIONECTOMY     ??? HX TONSIL AND ADENOIDECTOMY       Family History   Problem Relation Age of Onset   ??? Cancer Mother    ??? Cancer Father    ??? Cancer Brother      Social History     Socioeconomic History    ??? Marital status: MARRIED     Spouse name: Not on file   ??? Number of children: Not on file   ??? Years of education: Not on file   ??? Highest education level: Not on file   Occupational History   ??? Not on file   Social Needs   ??? Financial resource strain: Not on file   ??? Food insecurity:     Worry: Not on file     Inability: Not on file   ??? Transportation needs:     Medical: Not on file     Non-medical: Not on file   Tobacco Use   ??? Smoking status: Never Smoker   ??? Smokeless tobacco: Never Used   Substance and Sexual Activity   ??? Alcohol use: No   ??? Drug use: No   ??? Sexual activity: Yes     Partners: Male     Birth control/protection: None   Lifestyle   ??? Physical activity:  Days per week: Not on file     Minutes per session: Not on file   ??? Stress: Not on file   Relationships   ??? Social connections:     Talks on phone: Not on file     Gets together: Not on file     Attends religious service: Not on file     Active member of club or organization: Not on file     Attends meetings of clubs or organizations: Not on file     Relationship status: Not on file   ??? Intimate partner violence:     Fear of current or ex partner: Not on file     Emotionally abused: Not on file     Physically abused: Not on file     Forced sexual activity: Not on file   Other Topics Concern   ??? Not on file   Social History Narrative   ??? Not on file     Current Outpatient Medications   Medication Sig Dispense Refill   ??? gabapentin (NEURONTIN) 100 mg capsule Take  by mouth three (3) times daily.     ??? metoprolol succinate (TOPROL XL) 50 mg XL tablet Take 1 Tab by mouth daily. 90 Tab 3   ??? pantoprazole (PROTONIX) 40 mg tablet Take 40 mg by mouth daily.     ??? FLUoxetine (PROZAC) 20 mg capsule TAKE 1 CAPSULE BY MOUTH  DAILY 90 Cap 3   ??? FLOVENT HFA 220 mcg/actuation inhaler Take 1 Puff by inhalation two (2) times a day. 1 Inhaler 11   ??? Syringe with Needle, Disp, (BD ECLIPSE LUER-LOK) 3 mL 25 x 5/8" syrg  Inject 13m of B12 every 30 days IM 50 Syringe 2   ??? cyanocobalamin, vitamin B-12, 1,000 mcg/mL kit 1 mL by Injection route every month. 10 Kit 2   ??? atorvastatin (LIPITOR) 10 mg tablet Take 1 Tab by mouth daily. 90 Tab 4   ??? FLUoxetine (PROZAC) 40 mg capsule Take 1 Cap by mouth daily. 90 Cap 2   ??? ergocalciferol (ERGOCALCIFEROL) 50,000 unit capsule Take 1 Cap by mouth every seven (7) days. 12 Cap 12   ??? PROAIR HFA 90 mcg/actuation inhaler INHALE 2 INHALATIONS INTO THE LUNGS EVERY 6 (SIX) HOURS AS NEEDED.  11   ??? cpap machine kit by Does Not Apply route.           REVIEW OF SYSTEMS:    GENERAL: negative for - chills, fatigue, fever, hot flashes, malaise, night sweats, sleep disturbance, weight gain or weight loss.    EYES: negative for  - blurred vision, double vision, photophobia, pain, discharge and redness.    ENT AND MOUTH: negative for - epistaxis, headaches, hearing change, nasal   discharge, nasal polyps, oral lesions, facial pain,, sneezing, sore throat, tinnitus, vertigo, visual changes or vocal changes.    CARDIOVASCULAR: negative for - chest pain, dyspnea on exertion, pedal edema, irregular heartbeat, loss of consciousness,  orthopnea, palpitations, paroxysmal nocturnal dyspnea, resting shortness of breath.    RESPIRATORY:  negative for - cough wheezing, hemoptysis, orthopnea, pleuritic pain, shortness of breath, sputum changes,  tachypnea     GASTROINTESTINAL: negative for - abdominal pain, appetite loss, blood in stools, change in bowel habits, change in stools, constipation, diarrhea, gas/bloating, heartburn, hematemesis, melena, nausea/vomiting, stool incontinence or swallowing difficulty    GENITOURINARY FEMALE: negative for -change in menstrual cycle, change in urinary stream, dysmenorrhea, dyspareunia, dysuria, genital discharge, genital ulcers, hematuria, incontinence, irregular/heavy menses, pelvic  pain, urinary frequency/urgency and decrease in libido      NEUROLOGICAL: negative for - behavioral changes, bowel and bladder control changes, confusion, dizziness, gait disturbance, headaches, sensory changes, motor weakness, seizures, speech problems, tremors, visual changes or weakness    PSYCHIATRIC: negative for -, behavioral disorder, concentration difficulties, , depressive symptoms, disorientation, hallucinations, delusions, irritability, memory difficulties, mood swings, obsessive thoughts, physical abuse, sleep disturbances or suicidal ideation.    MUSCULOSKELETAL: negative for - gait disturbance, joint pain, joint stiffness, joint swelling, muscle pain, muscular weakness,     INTEGUMENTARY (BREASTS): negative for - a breast mass, a palpable lump in the breast, breast pain, nipple discharge     ENDOCRINE: negative for polydipsia, polyuria, polydypsia, cold-heat intolerance,  hot flashes,    HEM/LYMPH: negative for anemia, bruising, bleeding, lymph node enlargement,     ALLERGY/IMMUNOLOGIC: negative for allergies to medicine, food, dyes, hepatitis,    PHYSICAL EXAM  General appearance - alert, well appearing, and in no distress    Mental status - alert, oriented to person, place, and time    Eyes - pupils equal and reactive, extraocular eye movements intact    Ears - bilateral TM's and external ear canals normal    Nose - normal and patent, no erythema, discharge or polyps    Neck - supple, no significant adenopathy, carotids upstroke normal bilaterally, no bruits, thyroid exam: thyroid is normal in size without nodules or tenderness    Throat / Mouth - no erythema, no tonsillar exudate, normal dental hygiene, no ulcers    Chest - clear to auscultation, no wheezes, rales or rhonchi, symmetric air entry    Heart - normal rate and regular rhythm, S1 and S2 normal, no gallops noted, no murmur    Abdomen - soft, nontender, nondistended, no masses or organomegaly    Back exam - full range of motion, no tenderness, palpable spasm or pain.     Neurological - alert, oriented, normal speech, no focal findings or movement disorder noted    Musculoskeletal - no joint tenderness, deformity or swelling    Extremities - peripheral pulses normal, no pedal edema, no clubbing or cyanosis    Skin - normal coloration and turgor, no rashes, no suspicious skin lesions noted    Immunization History   Administered Date(s) Administered   ??? Hep B Vaccine 10/18/1993   ??? Influenza Vaccine 10/18/2009, 04/09/2014, 03/22/2015, 04/09/2016   ??? Influenza Vaccine (Quad) PF 04/09/2017   ??? Influenza Vaccine PF 06/05/2011, 04/03/2012   ??? Pneumococcal Conjugate (PCV-13) 02/13/2017   ??? Pneumococcal Polysaccharide (PPSV-23) 07/10/2010, 05/02/2014, 10/15/2014   ??? Tdap 10/19/2007, 10/15/2014          LABS:   Results for orders placed or performed in visit on 11/12/17   LIPID PANEL   Result Value Ref Range    Cholesterol, total 130 100 - 199 mg/dL    Triglyceride 156 (H) 0 - 149 mg/dL    HDL Cholesterol 44 >39 mg/dL    VLDL, calculated 31 5 - 40 mg/dL    LDL, calculated 55 0 - 99 mg/dL   METABOLIC PANEL, COMPREHENSIVE   Result Value Ref Range    Glucose 84 65 - 99 mg/dL    BUN 16 8 - 27 mg/dL    Creatinine 0.76 0.57 - 1.00 mg/dL    GFR est non-AA 83 >59 mL/min/1.73    GFR est AA 95 >59 mL/min/1.73    BUN/Creatinine ratio 21 12 - 28    Sodium 142 134 -  144 mmol/L    Potassium 4.2 3.5 - 5.2 mmol/L    Chloride 107 (H) 96 - 106 mmol/L    CO2 24 20 - 29 mmol/L    Calcium 9.6 8.7 - 10.3 mg/dL    Protein, total 5.9 (L) 6.0 - 8.5 g/dL    Albumin 4.0 3.6 - 4.8 g/dL    GLOBULIN, TOTAL 1.9 1.5 - 4.5 g/dL    A-G Ratio 2.1 1.2 - 2.2    Bilirubin, total 0.4 0.0 - 1.2 mg/dL    Alk. phosphatase 79 39 - 117 IU/L    AST (SGOT) 14 0 - 40 IU/L    ALT (SGPT) 15 0 - 32 IU/L   TSH 3RD GENERATION   Result Value Ref Range    TSH 2.070 0.450 - 4.500 uIU/mL   VITAMIN D, 25 HYDROXY   Result Value Ref Range    VITAMIN D, 25-HYDROXY 48.5 30.0 - 100.0 ng/mL   HEPATITIS C AB   Result Value Ref Range     Hep C Virus Ab <0.1 0.0 - 0.9 s/co ratio   AMB POC COMPLETE CBC,AUTOMATED ENTER   Result Value Ref Range    WBC (POC) 6.8 4.5 - 10.5 10^3/ul    LYMPHOCYTES (POC) 37.0 20.5 - 51.1 %    MONOCYTES (POC) 5.4 1.7 - 9.3 %    GRANULOCYTES (POC) 57.6 42.2 - 75.2 %    ABS. LYMPHS (POC) 2.5 1.2 - 3.4 10^3/ul    ABS. MONOS (POC) 0.4 0.1 - 0.6 10^3/ul    ABS. GRANS (POC) 3.9 1.4 - 6.5 10^3/ul    RBC (POC) 4.40 4 - 6 10^6/ul    HGB (POC) 13.4 11 - 18 g/dL    HCT (POC) 39.7 35 - 60 %    MCV (POC) 90.3 80 - 99.9 fL    MCH (POC) 30.6 27 - 31 pg    MCHC (POC) 33.9 33 - 37 g/dL    RDW (POC) 13.8 (A) 11.6 - 13.7 %    PLATELET (POC) 228 150 - 450 10^3/ul    MPV (POC) 9.4 7.8 - 11 fL   AMB POC URINALYSIS DIP STICK AUTO W/O MICRO   Result Value Ref Range    Color (UA POC) Yellow     Clarity (UA POC) Clear     Glucose (UA POC) Negative Negative mg/dL    Bilirubin (UA POC) Negative Negative    Ketones (UA POC) Negative Negative    Specific gravity (UA POC) 1.015 1.001 - 1.035    Blood (UA POC) Negative Negative    pH (UA POC) 5.5 4.6 - 8.0    Protein (UA POC) Negative Negative    Urobilinogen (UA POC) 0.2 mg/dL 0.2 - 1    Nitrites (UA POC) Negative Negative    Leukocyte esterase (UA POC) Negative Negative   AMB POC FECAL BLOOD, OCCULT, QL 3 CARDS   Result Value Ref Range    VALID INTERNAL CONTROL POC Yes     Hemoccult (POC) Negative Negative    Occult Blood-2 (POC) Negative     Occult blood-3 (POC) Negative          ASSESSMENT AND PLAN  Diagnoses and all orders for this visit:    1. Vitamin B 12 deficiency    2. Vitamin D deficiency    3. Pure hypercholesterolemia    4. GAD (generalized anxiety disorder)    5. Benzodiazepine withdrawal with complication (HCC)    6. Rib pain    7.  Recurrent major depressive disorder, in full remission ([place])        Continue B12/vitamin D supplementation,    Stable benzodiazepine withdrawal, off of Xanax, increase fluoxetine to 40 mg p.o. daily, we will see her back in 2 to 3 weeks,     Continue Flovent as directed, stable asthma symptoms,    GERD stable tolerating Protonix well.      [id number] Suzanne Kos, MD

## 2018-08-30 NOTE — Telephone Encounter (Signed)
Pt calls to complain of HA, sore throat. Dr Mercer Pod directs that she make an appt. She will call back if no improvement.

## 2018-09-30 ENCOUNTER — Encounter: Attending: Specialist | Primary: Specialist

## 2018-10-01 NOTE — Telephone Encounter (Signed)
Cardiologist from [facility] wants patient to have a coronary CTA with FFR. She is unable to get to [facility] but is requesting that you order and send to [facility]    Per DR: No elective procedure until 4 to 6 weeks, if patient has any acute symptoms, advised the patient to go to the emergency room    Notified patient

## 2018-10-07 ENCOUNTER — Encounter

## 2018-10-07 MED ORDER — ERGOCALCIFEROL (VITAMIN D2) 50,000 UNIT CAP
1250 mcg (50,000 unit) | ORAL_CAPSULE | ORAL | 9 refills | Status: AC
Start: 2018-10-07 — End: ?

## 2018-10-11 ENCOUNTER — Encounter

## 2018-10-11 MED ORDER — FLUOXETINE 40 MG CAP
40 mg | ORAL_CAPSULE | Freq: Every day | ORAL | 1 refills | Status: DC
Start: 2018-10-11 — End: 2019-02-07

## 2018-12-23 LAB — HM MAMMOGRAPHY

## 2019-02-06 ENCOUNTER — Encounter

## 2019-02-07 MED ORDER — FLUOXETINE 40 MG CAP
40 mg | ORAL_CAPSULE | ORAL | 3 refills | Status: AC
Start: 2019-02-07 — End: ?

## 2019-02-27 NOTE — Telephone Encounter (Signed)
Patient not seen for a while, needs follow-up appointment for lab/refill medication/visit,

## 2019-03-04 ENCOUNTER — Telehealth: Admit: 2019-03-04 | Discharge: 2019-03-04 | Payer: MEDICARE | Attending: Specialist | Primary: Specialist

## 2019-03-04 ENCOUNTER — Telehealth: Attending: Specialist | Primary: Specialist

## 2019-03-04 DIAGNOSIS — F411 Generalized anxiety disorder: Secondary | ICD-10-CM

## 2019-03-04 MED ORDER — CYANOCOBALAMIN (VIT B-12) 1,000 MCG/ML INJECTION KIT
1000 mcg/mL | PACK | INTRAMUSCULAR | 2 refills | Status: DC
Start: 2019-03-04 — End: 2019-03-07

## 2019-03-04 NOTE — Progress Notes (Signed)
I was in the office while conducting this encounter.    Consent:  She and/or her healthcare decision maker is aware that this patient-initiated Telehealth encounter is a billable service, with coverage as determined by her insurance carrier. She is aware that she may receive a bill and has provided verbal consent to proceed: Yes    This virtual visit was conducted via FaceTime. Pursuant to the emergency declaration under the [place], 1135 waiver authority and the [employer] Act, this Virtual  Visit was conducted to reduce the patient's risk of exposure to COVID-19 and provide continuity of care for an established patient.   Services were provided through a video synchronous discussion virtually to substitute for in-person clinic visit.  Due to this being a TeleHealth evaluation, many elements of the physical examination are unable to be assessed.     Total Time: minutes: 21-30 minutes.    [facility]  Campbell Riches, M.D.  Sudhirkumar C. Posey Pronto, M.D.  [place], [place]  Ph No:  [phone]  Fax:  [phone]      Chief Complaint   Patient presents with   ??? Medication Refill       History of Present Illness:  Ms. Riles is a 68 y.o. 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    Vitamin B12 deficiency, on B12 supplementation, also on vitamin D supplementation,      No radicular symptoms, no neuropathic symptoms, tolerating gabapentin  well,  Allergies   Allergen Reactions   ??? Aspirin Other (comments)     "Flu-like symptoms"   ??? Cymbalta [Duloxetine] Other (comments)     pain   ??? Elavil Other (comments)     pain   ??? Forteo [Teriparatide] Other (comments)   ??? Lyrica [Pregabalin] Other (comments)     Hallucinations and falling   ??? Prolia [Denosumab] Rash   ??? Topamax [Topiramate] Other (comments)     Slurred speach   ??? Ultram [Tramadol] Drowsiness     Past Medical History:   Diagnosis Date   ??? Aorta disorder (HCC)     TYPE B   ??? Arthritis    ??? Asthma    ??? Chronic pain     Back   ??? Heart disease    ??? Osteoporosis    ??? Rib fractures     2009   ??? Vitamin B 12 deficiency    ??? Vitamin D deficiency      Past Surgical History:   Procedure Laterality Date   ??? HX BUNIONECTOMY     ??? HX TONSIL AND ADENOIDECTOMY       Family History   Problem Relation Age of Onset   ??? Cancer Mother    ??? Cancer Father    ??? Cancer Brother      Social History     Socioeconomic History   ??? Marital status: MARRIED     Spouse name: Not on file   ??? Number of children: Not on file   ??? Years of education: Not on file   ???  Highest education level: Not on file   Occupational History   ??? Not on file   Social Needs   ??? Financial resource strain: Not on file   ??? Food insecurity     Worry: Not on file     Inability: Not on file   ??? Transportation needs     Medical: Not on file     Non-medical: Not on file   Tobacco Use   ??? Smoking status: Never Smoker   ??? Smokeless tobacco: Never Used   Substance and Sexual Activity   ??? Alcohol use: No   ??? Drug use: No   ??? Sexual activity: Yes     Partners: Male     Birth control/protection: None   Lifestyle   ??? Physical activity     Days per week: Not on file     Minutes per session: Not on file   ??? Stress: Not on file   Relationships   ??? Social Product manager on phone: Not on file     Gets together: Not on file     Attends religious service: Not on file     Active member of club or organization: Not on file     Attends meetings of clubs or  organizations: Not on file     Relationship status: Not on file   ??? Intimate partner violence     Fear of current or ex partner: Not on file     Emotionally abused: Not on file     Physically abused: Not on file     Forced sexual activity: Not on file   Other Topics Concern   ??? Not on file   Social History Narrative   ??? Not on file     Current Outpatient Medications   Medication Sig Dispense Refill   ??? cyanocobalamin, vitamin B-12, 1,000 mcg/mL kit 1 mL by Injection route every month. 10 Kit 2   ??? FLUoxetine (PROzac) 40 mg capsule TAKE 1 CAPSULE BY MOUTH  DAILY 90 Cap 3   ??? ergocalciferol (ERGOCALCIFEROL) 1,250 mcg (50,000 unit) capsule TAKE ONE CAPSULE BY MOUTH ONE TIME PER WEEK 12 Cap 9   ??? gabapentin (NEURONTIN) 100 mg capsule Take  by mouth three (3) times daily.     ??? metoprolol succinate (TOPROL XL) 50 mg XL tablet Take 1 Tab by mouth daily. 90 Tab 3   ??? pantoprazole (PROTONIX) 40 mg tablet Take 40 mg by mouth daily.     ??? FLOVENT HFA 220 mcg/actuation inhaler Take 1 Puff by inhalation two (2) times a day. 1 Inhaler 11   ??? Syringe with Needle, Disp, (BD ECLIPSE LUER-LOK) 3 mL 25 x 5/8" syrg Inject 1m of B12 every 30 days IM 50 Syringe 2   ??? atorvastatin (LIPITOR) 10 mg tablet Take 1 Tab by mouth daily. 90 Tab 4   ??? PROAIR HFA 90 mcg/actuation inhaler INHALE 2 INHALATIONS INTO THE LUNGS EVERY 6 (SIX) HOURS AS NEEDED.  11   ??? cpap machine kit by Does Not Apply route.           REVIEW OF SYSTEMS:    GENERAL: negative for - chills, fatigue, fever, hot flashes, malaise, night sweats, sleep disturbance, weight gain or weight loss.    EYES: negative for  - blurred vision, double vision, photophobia, pain, discharge and redness.    ENT AND MOUTH: negative for - epistaxis, headaches, hearing change, nasal   discharge, nasal polyps, oral lesions,  facial pain,, sneezing, sore throat, tinnitus, vertigo, visual changes or vocal changes.    CARDIOVASCULAR: negative for - chest pain, dyspnea on exertion, pedal edema,  irregular heartbeat, loss of consciousness,  orthopnea, palpitations, paroxysmal nocturnal dyspnea, resting shortness of breath.    RESPIRATORY:  negative for - cough wheezing, hemoptysis, orthopnea, pleuritic pain, shortness of breath, sputum changes,  tachypnea     GASTROINTESTINAL: negative for - abdominal pain, appetite loss, blood in stools, change in bowel habits, change in stools, constipation, diarrhea, gas/bloating, heartburn, hematemesis, melena, nausea/vomiting, stool incontinence or swallowing difficulty    GENITOURINARY FEMALE: negative for -change in menstrual cycle, change in urinary stream, dysmenorrhea, dyspareunia, dysuria, genital discharge, genital ulcers, hematuria, incontinence, irregular/heavy menses, pelvic pain, urinary frequency/urgency and decrease in libido     NEUROLOGICAL: negative for - behavioral changes, bowel and bladder control changes, confusion, dizziness, gait disturbance, headaches, sensory changes, motor weakness, seizures, speech problems, tremors, visual changes or weakness    PSYCHIATRIC: negative for - anxiety, behavioral disorder, concentration difficulties, , depressive symptoms, disorientation, hallucinations, delusions, irritability, memory difficulties, mood swings, obsessive thoughts, physical abuse, sleep disturbances or suicidal ideation.    MUSCULOSKELETAL: negative for - gait disturbance, joint pain, joint stiffness, joint swelling, muscle pain, muscular weakness,     INTEGUMENTARY (BREASTS): negative for - a breast mass, a palpable lump in the breast, breast pain, nipple discharge     ENDOCRINE: negative for polydipsia, polyuria, polydypsia, cold-heat intolerance,  hot flashes,    HEM/LYMPH: negative for anemia, bruising, bleeding, lymph node enlargement,     ALLERGY/IMMUNOLOGIC: negative for allergies to medicine, food, dyes, hepatitis,      Immunization History   Administered Date(s) Administered   ??? Hep B Vaccine 10/18/1993   ??? Influenza Vaccine 10/18/2009,  04/09/2014, 03/22/2015, 04/09/2016   ??? Influenza Vaccine (Quad) PF 04/09/2017   ??? Influenza Vaccine PF 06/05/2011, 04/03/2012   ??? Pneumococcal Conjugate (PCV-13) 02/13/2017   ??? Pneumococcal Polysaccharide (PPSV-23) 07/10/2010, 05/02/2014, 10/15/2014   ??? Tdap 10/19/2007, 10/15/2014          LABS:   Results for orders placed or performed in visit on 11/12/17   LIPID PANEL   Result Value Ref Range    Cholesterol, total 130 100 - 199 mg/dL    Triglyceride 156 (H) 0 - 149 mg/dL    HDL Cholesterol 44 >39 mg/dL    VLDL, calculated 31 5 - 40 mg/dL    LDL, calculated 55 0 - 99 mg/dL   METABOLIC PANEL, COMPREHENSIVE   Result Value Ref Range    Glucose 84 65 - 99 mg/dL    BUN 16 8 - 27 mg/dL    Creatinine 0.76 0.57 - 1.00 mg/dL    GFR est non-AA 83 >59 mL/min/1.73    GFR est AA 95 >59 mL/min/1.73    BUN/Creatinine ratio 21 12 - 28    Sodium 142 134 - 144 mmol/L    Potassium 4.2 3.5 - 5.2 mmol/L    Chloride 107 (H) 96 - 106 mmol/L    CO2 24 20 - 29 mmol/L    Calcium 9.6 8.7 - 10.3 mg/dL    Protein, total 5.9 (L) 6.0 - 8.5 g/dL    Albumin 4.0 3.6 - 4.8 g/dL    GLOBULIN, TOTAL 1.9 1.5 - 4.5 g/dL    A-G Ratio 2.1 1.2 - 2.2    Bilirubin, total 0.4 0.0 - 1.2 mg/dL    Alk. phosphatase 79 39 - 117 IU/L    AST (SGOT) 14 0 -  40 IU/L    ALT (SGPT) 15 0 - 32 IU/L   TSH 3RD GENERATION   Result Value Ref Range    TSH 2.070 0.450 - 4.500 uIU/mL   VITAMIN D, 25 HYDROXY   Result Value Ref Range    VITAMIN D, 25-HYDROXY 48.5 30.0 - 100.0 ng/mL   HEPATITIS C AB   Result Value Ref Range    Hep C Virus Ab <0.1 0.0 - 0.9 s/co ratio   AMB POC COMPLETE CBC,AUTOMATED ENTER   Result Value Ref Range    WBC (POC) 6.8 4.5 - 10.5 10^3/ul    LYMPHOCYTES (POC) 37.0 20.5 - 51.1 %    MONOCYTES (POC) 5.4 1.7 - 9.3 %    GRANULOCYTES (POC) 57.6 42.2 - 75.2 %    ABS. LYMPHS (POC) 2.5 1.2 - 3.4 10^3/ul    ABS. MONOS (POC) 0.4 0.1 - 0.6 10^3/ul    ABS. GRANS (POC) 3.9 1.4 - 6.5 10^3/ul    RBC (POC) 4.40 4 - 6 10^6/ul    HGB (POC) 13.4 11 - 18 g/dL    HCT (POC) 39.7  35 - 60 %    MCV (POC) 90.3 80 - 99.9 fL    MCH (POC) 30.6 27 - 31 pg    MCHC (POC) 33.9 33 - 37 g/dL    RDW (POC) 13.8 (A) 11.6 - 13.7 %    PLATELET (POC) 228 150 - 450 10^3/ul    MPV (POC) 9.4 7.8 - 11 fL   AMB POC URINALYSIS DIP STICK AUTO W/O MICRO   Result Value Ref Range    Color (UA POC) Yellow     Clarity (UA POC) Clear     Glucose (UA POC) Negative Negative mg/dL    Bilirubin (UA POC) Negative Negative    Ketones (UA POC) Negative Negative    Specific gravity (UA POC) 1.015 1.001 - 1.035    Blood (UA POC) Negative Negative    pH (UA POC) 5.5 4.6 - 8.0    Protein (UA POC) Negative Negative    Urobilinogen (UA POC) 0.2 mg/dL 0.2 - 1    Nitrites (UA POC) Negative Negative    Leukocyte esterase (UA POC) Negative Negative   AMB POC FECAL BLOOD, OCCULT, QL 3 CARDS   Result Value Ref Range    VALID INTERNAL CONTROL POC Yes     Hemoccult (POC) Negative Negative    Occult Blood-2 (POC) Negative     Occult blood-3 (POC) Negative          ASSESSMENT AND PLAN  Diagnoses and all orders for this visit:    1. GAD (generalized anxiety disorder)    2. Vitamin B 12 deficiency  -     cyanocobalamin, vitamin B-12, 1,000 mcg/mL kit; 1 mL by Injection route every month.    3. 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 Vitamin D deficiency    6. Moderate asthma without complication, unspecified whether persistent    7. Postmenopausal  -     DEXA,BONE DENSITY,AXIAL SKELETON        Stable GAD, continue fluoxetine as directed.  Continue B12/vitamin D supplementation,    Stable aortic dissection,    Continue Flovent as directed,    Schedule for bone density.    Discussed the CTA/MRI cardiac with the patient,      LOVFIEPP Theodoro Kos, MD

## 2019-03-04 NOTE — Progress Notes (Addendum)
I was in the office while conducting this encounter.    Consent:  She and/or her healthcare decision maker is aware that this patient-initiated Telehealth encounter is a billable service, with coverage as determined by her insurance carrier. She is aware that she may receive a bill and has provided verbal consent to proceed: Yes    This virtual visit was conducted via FaceTime. Pursuant to the emergency declaration under the [place], 1135 waiver authority and the [employer] Act, this Virtual  Visit was conducted to reduce the patient's risk of exposure to COVID-19 and provide continuity of care for an established patient.   Services were provided through a video synchronous discussion virtually to substitute for in-person clinic visit.  Due to this being a TeleHealth evaluation, many elements of the physical examination are unable to be assessed.     Total Time: minutes: 21-30 minutes.    [facility]  Suzanne Pham, M.D.  Suzanne Pham, M.D.  [place], [place]  Ph No:  [phone]  Fax:  [phone]      Chief Complaint   Patient presents with   ??? Medication Refill       History of Present Illness:  Suzanne Pham is a 68 y.o. 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    Vitamin B12 deficiency, on B12 supplementation, also on vitamin D supplementation,      No radicular symptoms, no neuropathic symptoms, tolerating gabapentin well,   Allergies   Allergen Reactions   ??? Aspirin Other (comments)     "Flu-like symptoms"   ??? Cymbalta [Duloxetine] Other (comments)     pain   ??? Elavil Other (comments)     pain   ??? Forteo [Teriparatide] Other (comments)   ??? Lyrica [Pregabalin] Other (comments)     Hallucinations and falling   ??? Prolia [Denosumab] Rash   ??? Topamax [Topiramate] Other (comments)     Slurred speach   ??? Ultram [Tramadol] Drowsiness     Past Medical History:   Diagnosis Date   ??? Aorta disorder (HCC)     TYPE B   ??? Arthritis    ??? Asthma    ??? Chronic pain     Back   ??? Heart disease    ??? Osteoporosis    ??? Rib fractures     2009   ??? Vitamin B 12 deficiency    ??? Vitamin D deficiency      Past Surgical History:   Procedure Laterality Date   ??? HX BUNIONECTOMY     ??? HX TONSIL AND ADENOIDECTOMY       Family History   Problem Relation Age of Onset   ??? Cancer Mother    ??? Cancer Father    ??? Cancer Brother      Social History     Socioeconomic History   ??? Marital status: MARRIED     Spouse name: Not on file   ??? Number of children: Not on file   ??? Years of education: Not on file   ???  Highest education level: Not on file   Occupational History   ??? Not on file   Social Needs   ??? Financial resource strain: Not on file   ??? Food insecurity     Worry: Not on file     Inability: Not on file   ??? Transportation needs     Medical: Not on file     Non-medical: Not on file   Tobacco Use   ??? Smoking status: Never Smoker   ??? Smokeless tobacco: Never Used   Substance and Sexual Activity   ??? Alcohol use: No   ??? Drug use: No   ??? Sexual activity: Yes     Partners: Male     Birth control/protection: None   Lifestyle   ??? Physical activity     Days per week: Not on file     Minutes per session: Not on file   ??? Stress: Not on file   Relationships   ??? Social Product manager on phone: Not on file     Gets together: Not on file     Attends religious service: Not on file     Active member of club or organization: Not on file      Attends meetings of clubs or organizations: Not on file     Relationship status: Not on file   ??? Intimate partner violence     Fear of current or ex partner: Not on file     Emotionally abused: Not on file     Physically abused: Not on file     Forced sexual activity: Not on file   Other Topics Concern   ??? Not on file   Social History Narrative   ??? Not on file     Current Outpatient Medications   Medication Sig Dispense Refill   ??? cyanocobalamin, vitamin B-12, 1,000 mcg/mL kit 1 mL by Injection route every month. 10 Kit 2   ??? FLUoxetine (PROzac) 40 mg capsule TAKE 1 CAPSULE BY MOUTH  DAILY 90 Cap 3   ??? ergocalciferol (ERGOCALCIFEROL) 1,250 mcg (50,000 unit) capsule TAKE ONE CAPSULE BY MOUTH ONE TIME PER WEEK 12 Cap 9   ??? gabapentin (NEURONTIN) 100 mg capsule Take  by mouth three (3) times daily.     ??? metoprolol succinate (TOPROL XL) 50 mg XL tablet Take 1 Tab by mouth daily. 90 Tab 3   ??? pantoprazole (PROTONIX) 40 mg tablet Take 40 mg by mouth daily.     ??? FLOVENT HFA 220 mcg/actuation inhaler Take 1 Puff by inhalation two (2) times a day. 1 Inhaler 11   ??? Syringe with Needle, Disp, (BD ECLIPSE LUER-LOK) 3 mL 25 x 5/8" syrg Inject 84m of B12 every 30 days IM 50 Syringe 2   ??? atorvastatin (LIPITOR) 10 mg tablet Take 1 Tab by mouth daily. 90 Tab 4   ??? PROAIR HFA 90 mcg/actuation inhaler INHALE 2 INHALATIONS INTO THE LUNGS EVERY 6 (SIX) HOURS AS NEEDED.  11   ??? cpap machine kit by Does Not Apply route.           REVIEW OF SYSTEMS:    GENERAL: negative for - chills, fatigue, fever, hot flashes, malaise, night sweats, sleep disturbance, weight gain or weight loss.    EYES: negative for  - blurred vision, double vision, photophobia, pain, discharge and redness.    ENT AND MOUTH: negative for - epistaxis, headaches, hearing change, nasal   discharge, nasal polyps, oral lesions,  facial pain,, sneezing, sore throat, tinnitus, vertigo, visual changes or vocal changes.     CARDIOVASCULAR: negative for - chest pain, dyspnea on exertion, pedal edema, irregular heartbeat, loss of consciousness,  orthopnea, palpitations, paroxysmal nocturnal dyspnea, resting shortness of breath.    RESPIRATORY:  negative for - cough wheezing, hemoptysis, orthopnea, pleuritic pain, shortness of breath, sputum changes,  tachypnea     GASTROINTESTINAL: negative for - abdominal pain, appetite loss, blood in stools, change in bowel habits, change in stools, constipation, diarrhea, gas/bloating, heartburn, hematemesis, melena, nausea/vomiting, stool incontinence or swallowing difficulty    GENITOURINARY FEMALE: negative for -change in menstrual cycle, change in urinary stream, dysmenorrhea, dyspareunia, dysuria, genital discharge, genital ulcers, hematuria, incontinence, irregular/heavy menses, pelvic pain, urinary frequency/urgency and decrease in libido     NEUROLOGICAL: negative for - behavioral changes, bowel and bladder control changes, confusion, dizziness, gait disturbance, headaches, sensory changes, motor weakness, seizures, speech problems, tremors, visual changes or weakness    PSYCHIATRIC: negative for - anxiety, behavioral disorder, concentration difficulties, , depressive symptoms, disorientation, hallucinations, delusions, irritability, memory difficulties, mood swings, obsessive thoughts, physical abuse, sleep disturbances or suicidal ideation.    MUSCULOSKELETAL: negative for - gait disturbance, joint pain, joint stiffness, joint swelling, muscle pain, muscular weakness,     INTEGUMENTARY (BREASTS): negative for - a breast mass, a palpable lump in the breast, breast pain, nipple discharge     ENDOCRINE: negative for polydipsia, polyuria, polydypsia, cold-heat intolerance,  hot flashes,    HEM/LYMPH: negative for anemia, bruising, bleeding, lymph node enlargement,     ALLERGY/IMMUNOLOGIC: negative for allergies to medicine, food, dyes, hepatitis,      Immunization History    Administered Date(s) Administered   ??? Hep B Vaccine 10/18/1993   ??? Influenza Vaccine 10/18/2009, 04/09/2014, 03/22/2015, 04/09/2016   ??? Influenza Vaccine (Quad) PF 04/09/2017   ??? Influenza Vaccine PF 06/05/2011, 04/03/2012   ??? Pneumococcal Conjugate (PCV-13) 02/13/2017   ??? Pneumococcal Polysaccharide (PPSV-23) 07/10/2010, 05/02/2014, 10/15/2014   ??? Tdap 10/19/2007, 10/15/2014          LABS:   Results for orders placed or performed in visit on 11/12/17   LIPID PANEL   Result Value Ref Range    Cholesterol, total 130 100 - 199 mg/dL    Triglyceride 156 (H) 0 - 149 mg/dL    HDL Cholesterol 44 >39 mg/dL    VLDL, calculated 31 5 - 40 mg/dL    LDL, calculated 55 0 - 99 mg/dL   METABOLIC PANEL, COMPREHENSIVE   Result Value Ref Range    Glucose 84 65 - 99 mg/dL    BUN 16 8 - 27 mg/dL    Creatinine 0.76 0.57 - 1.00 mg/dL    GFR est non-AA 83 >59 mL/min/1.73    GFR est AA 95 >59 mL/min/1.73    BUN/Creatinine ratio 21 12 - 28    Sodium 142 134 - 144 mmol/L    Potassium 4.2 3.5 - 5.2 mmol/L    Chloride 107 (H) 96 - 106 mmol/L    CO2 24 20 - 29 mmol/L    Calcium 9.6 8.7 - 10.3 mg/dL    Protein, total 5.9 (L) 6.0 - 8.5 g/dL    Albumin 4.0 3.6 - 4.8 g/dL    GLOBULIN, TOTAL 1.9 1.5 - 4.5 g/dL    A-G Ratio 2.1 1.2 - 2.2    Bilirubin, total 0.4 0.0 - 1.2 mg/dL    Alk. phosphatase 79 39 - 117 IU/L    AST (SGOT) 14 0 -  40 IU/L    ALT (SGPT) 15 0 - 32 IU/L   TSH 3RD GENERATION   Result Value Ref Range    TSH 2.070 0.450 - 4.500 uIU/mL   VITAMIN D, 25 HYDROXY   Result Value Ref Range    VITAMIN D, 25-HYDROXY 48.5 30.0 - 100.0 ng/mL   HEPATITIS C AB   Result Value Ref Range    Hep C Virus Ab <0.1 0.0 - 0.9 s/co ratio   AMB POC COMPLETE CBC,AUTOMATED ENTER   Result Value Ref Range    WBC (POC) 6.8 4.5 - 10.5 10^3/ul    LYMPHOCYTES (POC) 37.0 20.5 - 51.1 %    MONOCYTES (POC) 5.4 1.7 - 9.3 %    GRANULOCYTES (POC) 57.6 42.2 - 75.2 %    ABS. LYMPHS (POC) 2.5 1.2 - 3.4 10^3/ul    ABS. MONOS (POC) 0.4 0.1 - 0.6 10^3/ul     ABS. GRANS (POC) 3.9 1.4 - 6.5 10^3/ul    RBC (POC) 4.40 4 - 6 10^6/ul    HGB (POC) 13.4 11 - 18 g/dL    HCT (POC) 39.7 35 - 60 %    MCV (POC) 90.3 80 - 99.9 fL    MCH (POC) 30.6 27 - 31 pg    MCHC (POC) 33.9 33 - 37 g/dL    RDW (POC) 13.8 (A) 11.6 - 13.7 %    PLATELET (POC) 228 150 - 450 10^3/ul    MPV (POC) 9.4 7.8 - 11 fL   AMB POC URINALYSIS DIP STICK AUTO W/O MICRO   Result Value Ref Range    Color (UA POC) Yellow     Clarity (UA POC) Clear     Glucose (UA POC) Negative Negative mg/dL    Bilirubin (UA POC) Negative Negative    Ketones (UA POC) Negative Negative    Specific gravity (UA POC) 1.015 1.001 - 1.035    Blood (UA POC) Negative Negative    pH (UA POC) 5.5 4.6 - 8.0    Protein (UA POC) Negative Negative    Urobilinogen (UA POC) 0.2 mg/dL 0.2 - 1    Nitrites (UA POC) Negative Negative    Leukocyte esterase (UA POC) Negative Negative   AMB POC FECAL BLOOD, OCCULT, QL 3 CARDS   Result Value Ref Range    VALID INTERNAL CONTROL POC Yes     Hemoccult (POC) Negative Negative    Occult Blood-2 (POC) Negative     Occult blood-3 (POC) Negative          ASSESSMENT AND PLAN  Diagnoses and all orders for this visit:    1. GAD (generalized anxiety disorder)    2. Vitamin B 12 deficiency  -     cyanocobalamin, vitamin B-12, 1,000 mcg/mL kit; 1 mL by Injection route every month.    3. 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 Vitamin D deficiency    6. Moderate asthma without complication, unspecified whether persistent    7. Postmenopausal  -     DEXA,BONE DENSITY,AXIAL SKELETON        Stable GAD, continue fluoxetine as directed.  Continue B12/vitamin D supplementation,    Stable aortic dissection,    Continue Flovent as directed,    Schedule for bone density.    Discussed the CTA/MRI cardiac with the patient,      [id number] Theodoro Kos, MD

## 2019-03-07 MED ORDER — CYANOCOBALAMIN 1,000 MCG/ML IJ SOLN
1000 mcg/mL | Freq: Once | INTRAMUSCULAR | 2 refills | Status: AC
Start: 2019-03-07 — End: 2019-03-07

## 2019-03-07 NOTE — Telephone Encounter (Signed)
Orders Placed This Encounter   ??? cyanocobalamin (VITAMIN B12) 1,000 mcg/mL injection     Sig: 1,000 mcg by IntraMUSCular route once.     Ok per Dr Mercer Pod

## 2019-10-20 ENCOUNTER — Other Ambulatory Visit: Payer: Self-pay | Admitting: Certified Nurse Midwife

## 2019-10-20 DIAGNOSIS — Z1231 Encounter for screening mammogram for malignant neoplasm of breast: Secondary | ICD-10-CM

## 2019-12-29 ENCOUNTER — Ambulatory Visit
Admission: RE | Admit: 2019-12-29 | Discharge: 2019-12-29 | Disposition: A | Discharge status: Home / Self Care | Payer: Medicare Other | Source: Ambulatory Visit | Attending: Certified Nurse Midwife | Admitting: Certified Nurse Midwife

## 2019-12-29 DIAGNOSIS — Z1231 Encounter for screening mammogram for malignant neoplasm of breast: Secondary | ICD-10-CM | POA: Diagnosis present

## 2019-12-29 HISTORY — DX: Malignant (primary) neoplasm, unspecified: C80.1

## 2019-12-29 IMAGING — MG DIGITAL SCREENING BILAT W/ TOMO W/ CAD
8 series · 8 of 24 positions shown · non-contrast
Comparison: Previous exam(s).

CLINICAL DATA: Screening.

EXAM:
DIGITAL SCREENING BILATERAL MAMMOGRAM WITH TOMO AND CAD

[L MLO synth-2D]
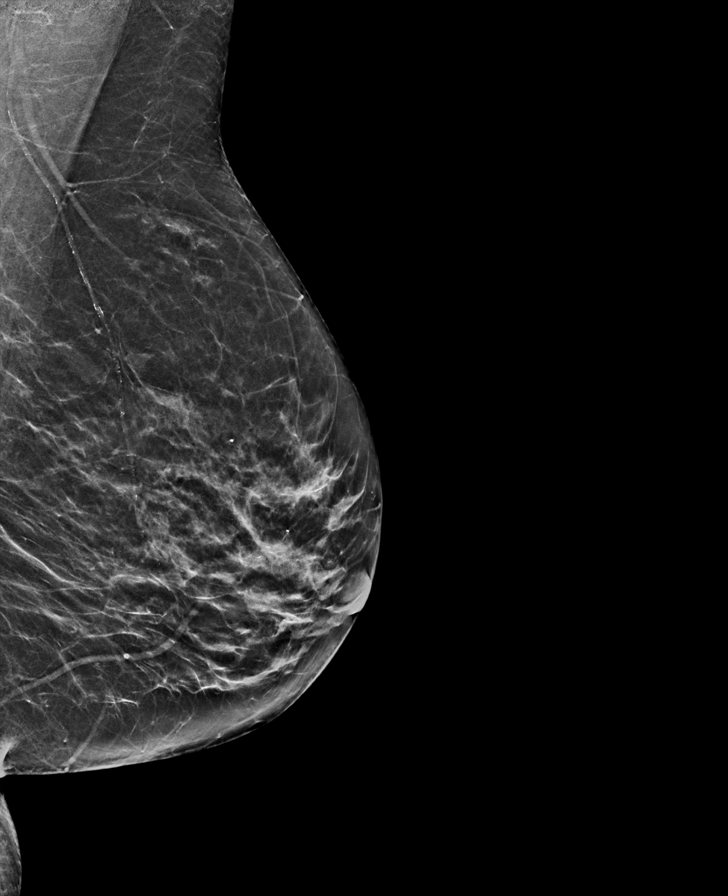

[R MLO synth-2D]
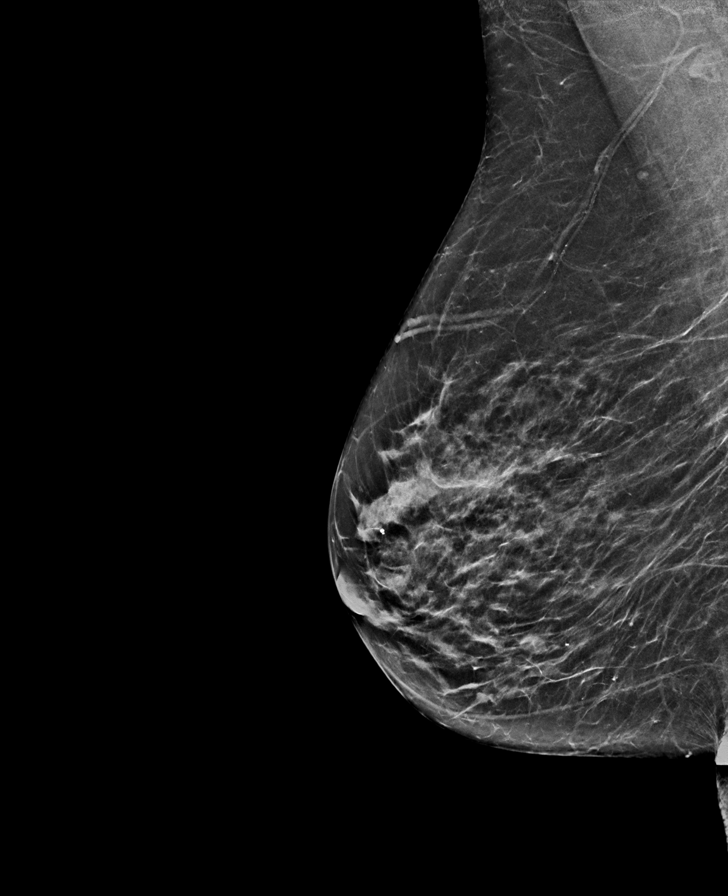

[L CC synth-2D]
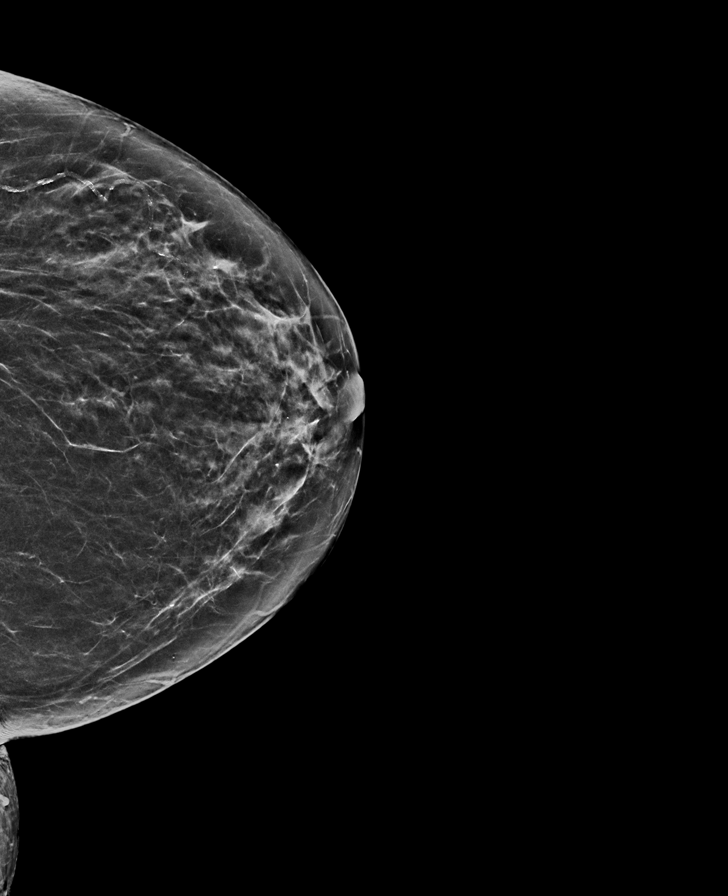

[R CC synth-2D]
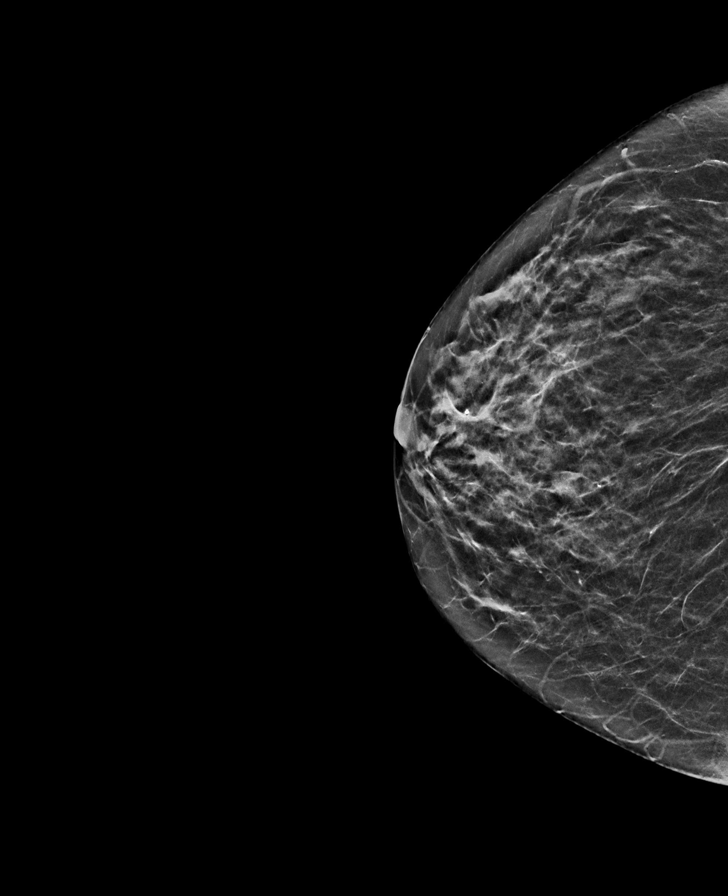

[L MLO tomo · tomo slice 29/56.0]
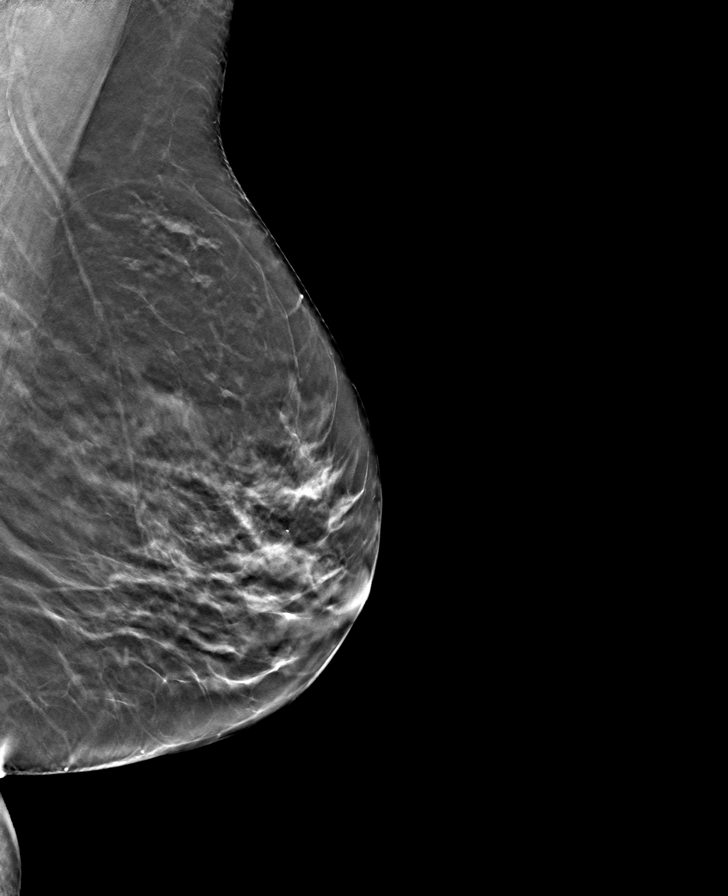

[L CC tomo · tomo slice 29/58.0]
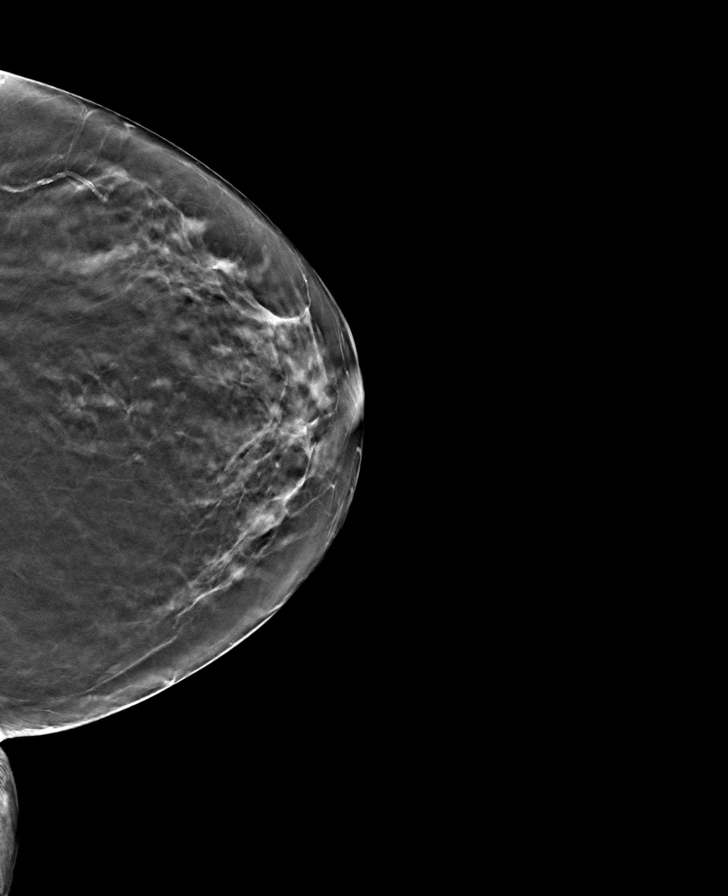

[R CC tomo · tomo slice 26/51.0]
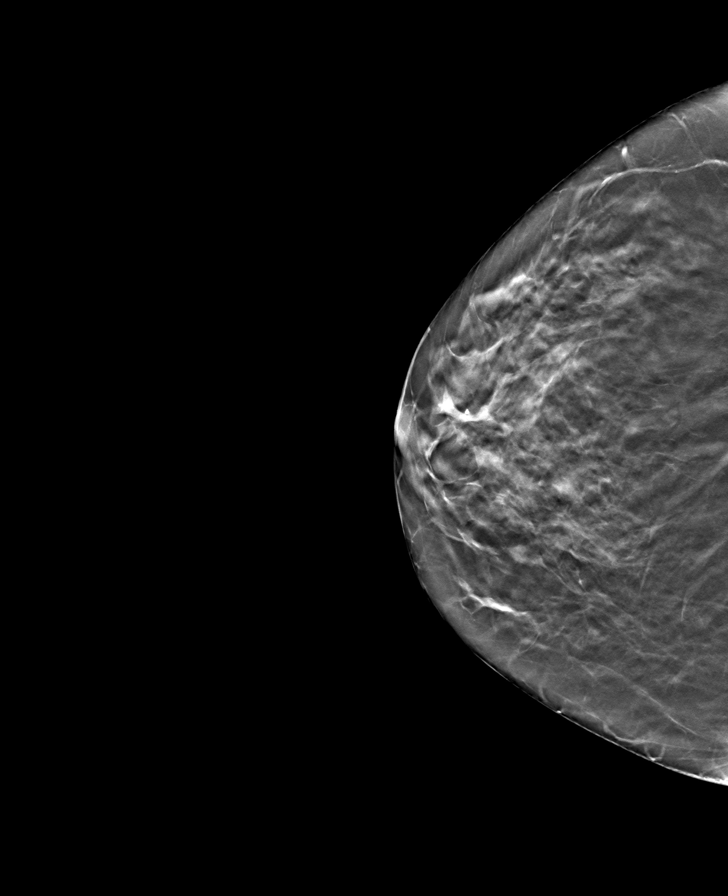

[R MLO tomo · tomo slice 29/57.0]
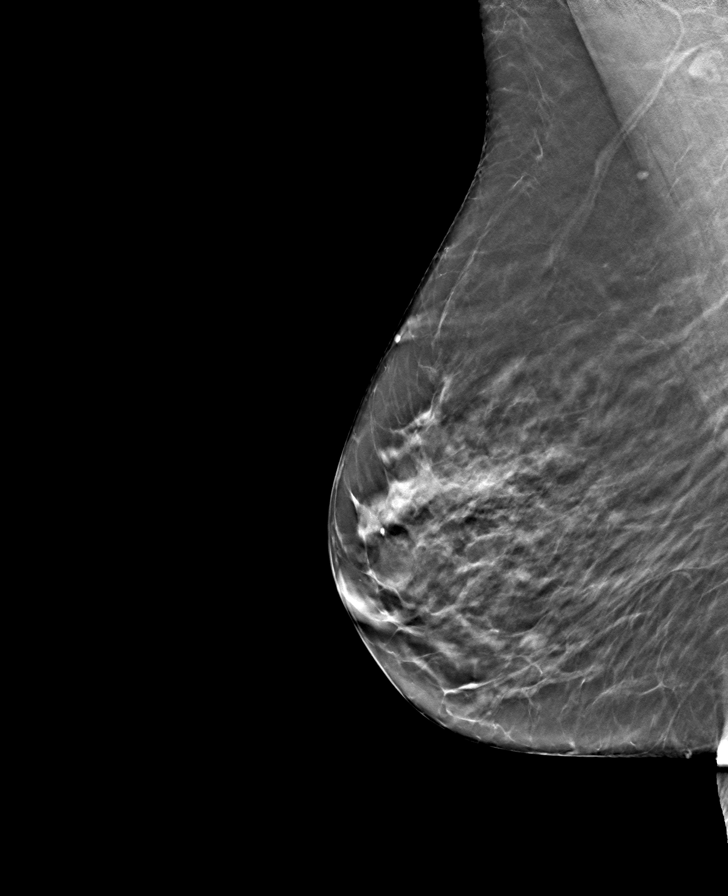

[8 of 24 positions shown; findings below may reference images not displayed]

ACR Breast Density Category c: The breast tissue is heterogeneously
dense, which may obscure small masses.
FINDINGS: There are no findings suspicious for malignancy. Images were
processed with CAD.
IMPRESSION: No mammographic evidence of malignancy. A result letter of this
screening mammogram will be mailed directly to the patient.

RECOMMENDATION:
Screening mammogram in one year. (Code:[5V])

BI-RADS CATEGORY  1: Negative.

## 2020-07-13 ENCOUNTER — Ambulatory Visit (INDEPENDENT_AMBULATORY_CARE_PROVIDER_SITE_OTHER): Payer: Medicare Other

## 2020-07-13 ENCOUNTER — Other Ambulatory Visit: Payer: Self-pay

## 2020-07-13 ENCOUNTER — Ambulatory Visit
Admission: EM | Admit: 2020-07-13 | Discharge: 2020-07-13 | Disposition: A | Discharge status: Home / Self Care | Payer: Medicare Other | Attending: Family Medicine | Admitting: Family Medicine

## 2020-07-13 ENCOUNTER — Encounter: Payer: Self-pay | Admitting: Emergency Medicine

## 2020-07-13 DIAGNOSIS — Z20822 Contact with and (suspected) exposure to covid-19: Secondary | ICD-10-CM | POA: Diagnosis not present

## 2020-07-13 DIAGNOSIS — J45901 Unspecified asthma with (acute) exacerbation: Secondary | ICD-10-CM | POA: Diagnosis present

## 2020-07-13 DIAGNOSIS — R0602 Shortness of breath: Secondary | ICD-10-CM | POA: Diagnosis not present

## 2020-07-13 DIAGNOSIS — J209 Acute bronchitis, unspecified: Secondary | ICD-10-CM | POA: Diagnosis present

## 2020-07-13 DIAGNOSIS — R059 Cough, unspecified: Secondary | ICD-10-CM

## 2020-07-13 LAB — SARS CORONAVIRUS 2 (TAT 6-24 HRS): SARS Coronavirus 2: NEGATIVE

## 2020-07-13 IMAGING — CR DG CHEST 2V
2 series · 2 of 2 positions shown · non-contrast
Comparison: [DATE]

CLINICAL DATA: Cough and shortness of breath

EXAM:
CHEST - 2 VIEW

[chest pa]
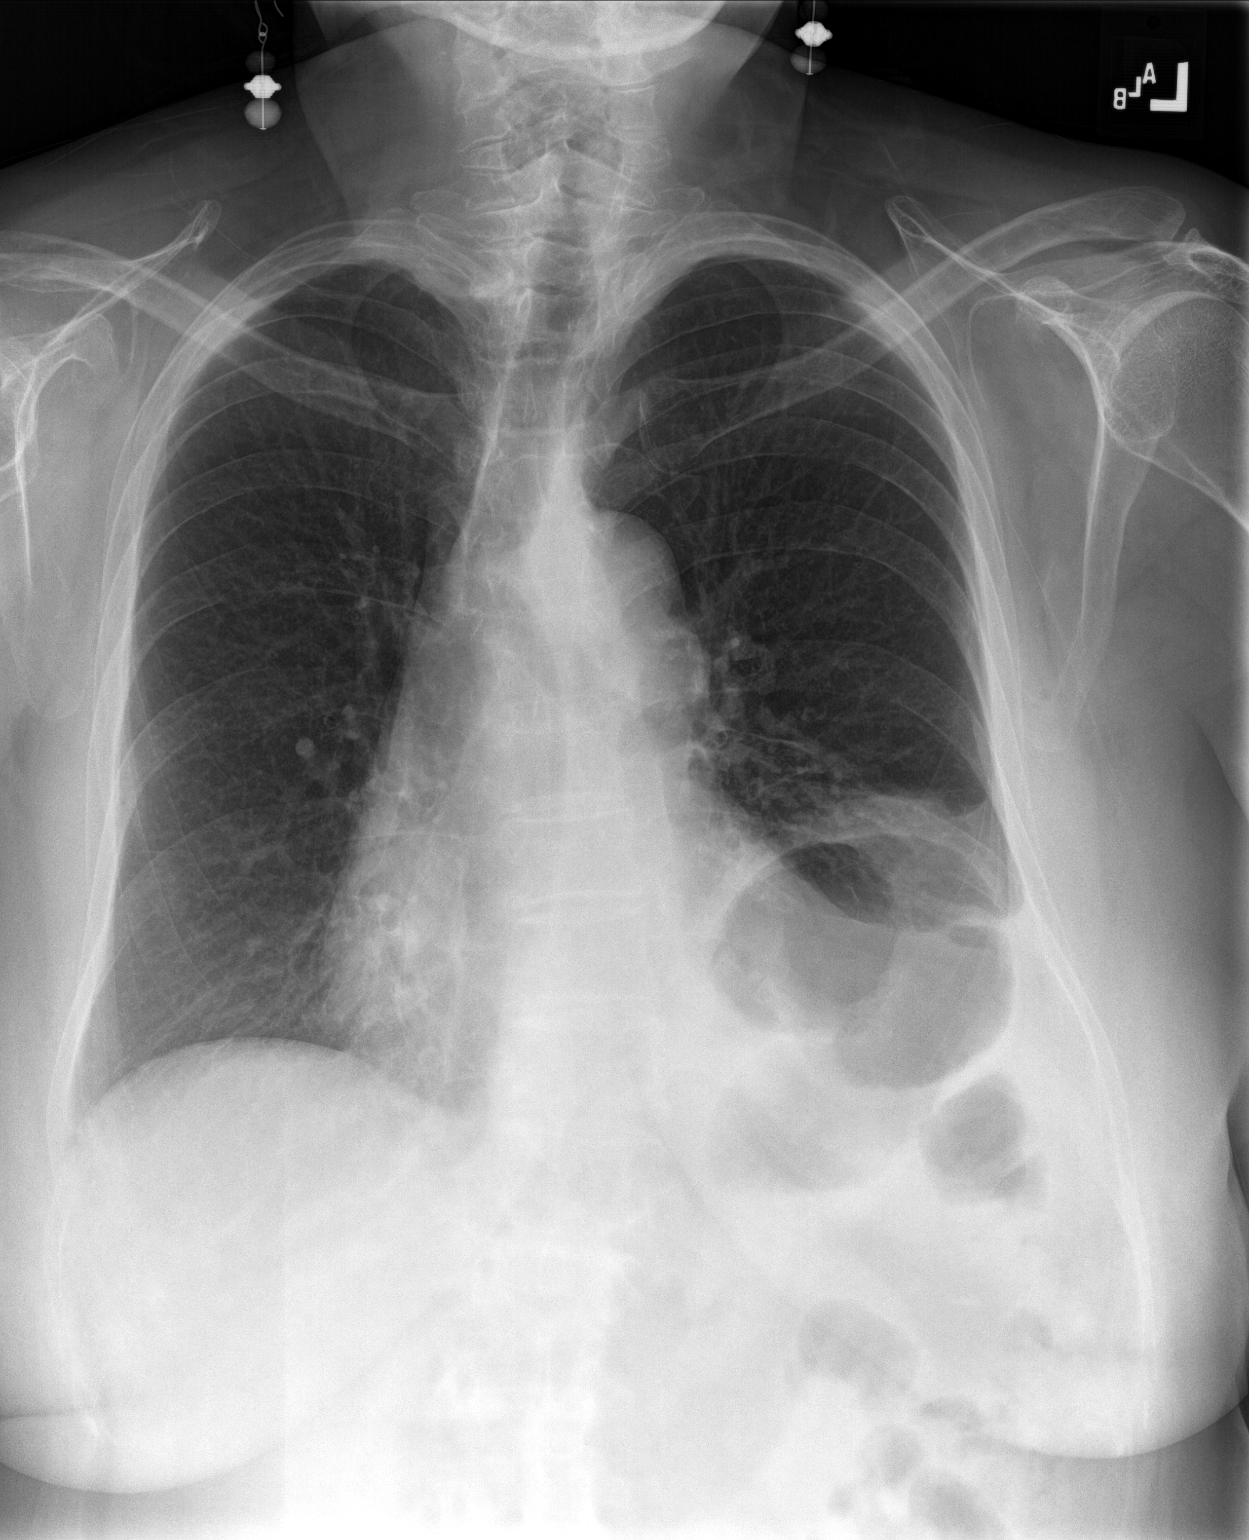

[chest lat]
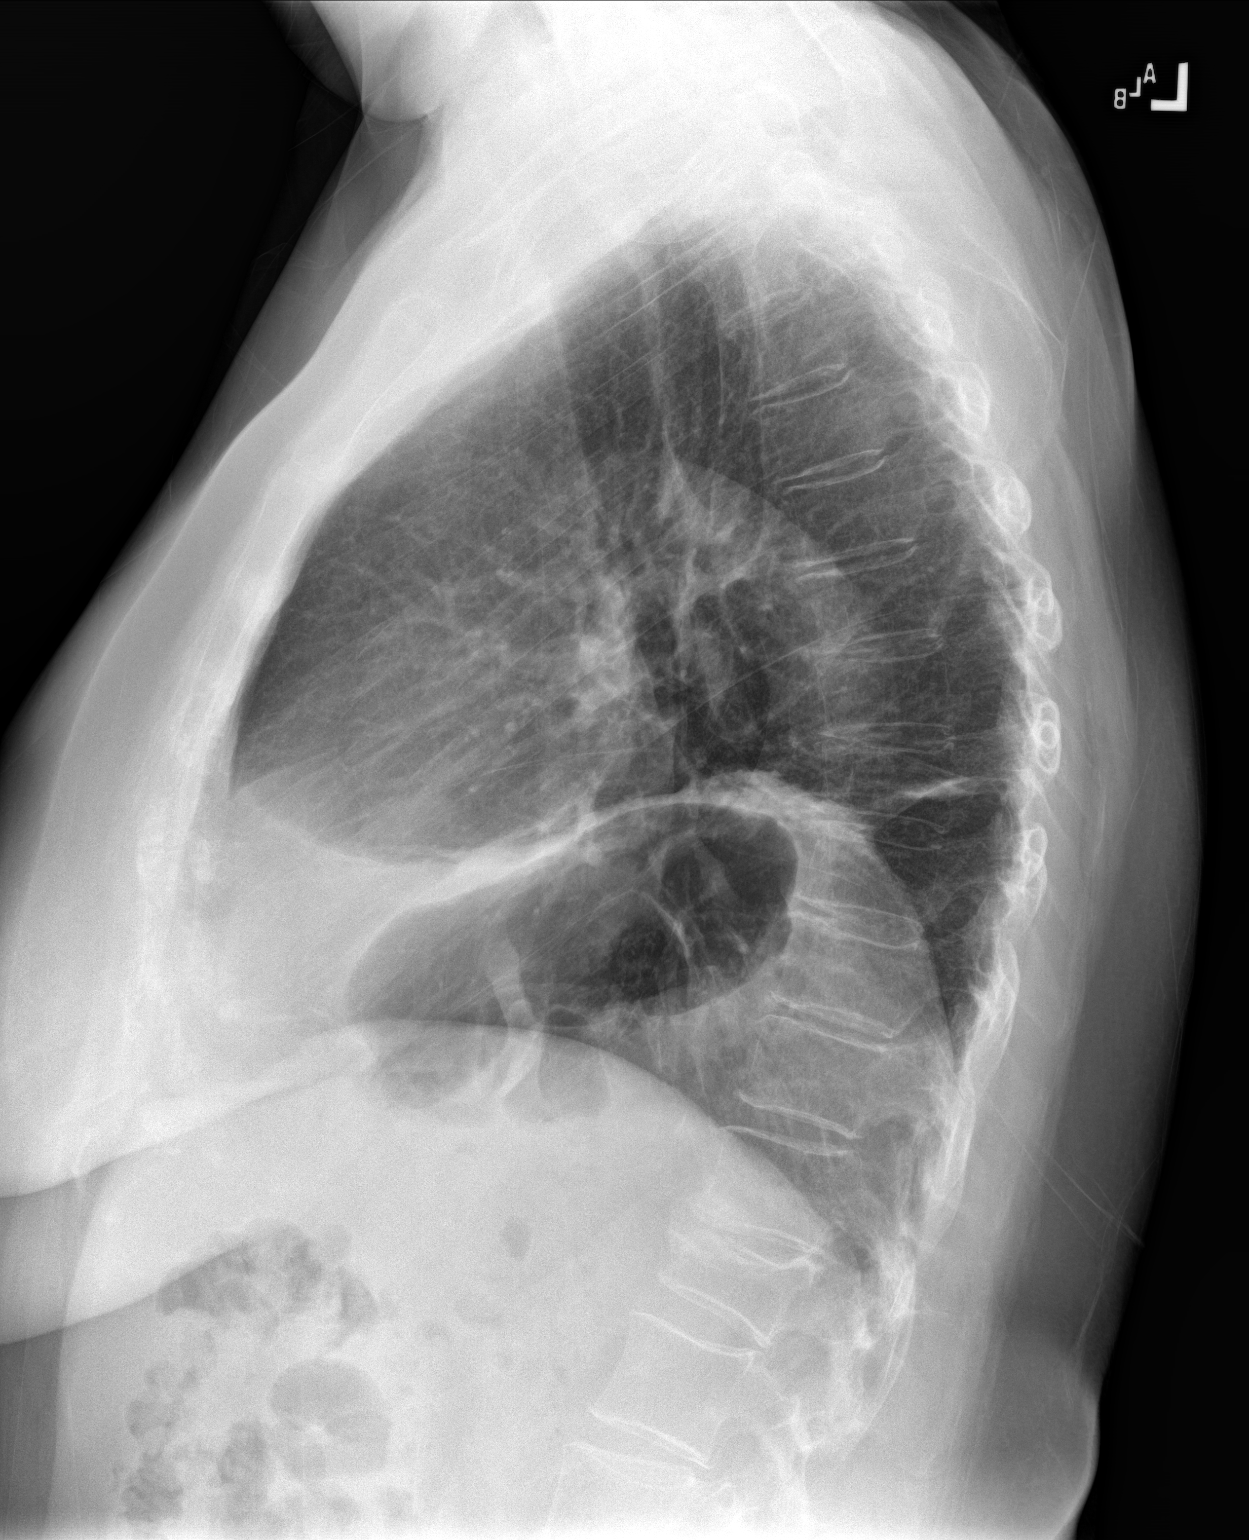

[2 of 2 positions shown; findings below may reference images not displayed]

FINDINGS: Cardiac shadow is within normal limits. Elevation of left
hemidiaphragm is again seen with blunting of left costophrenic
angle. The overall appearance is stable. No acute infiltrate or
sizable effusion is noted. Chronic compression deformity is noted at
the thoracolumbar junction.
IMPRESSION: Chronic elevation of left hemidiaphragm. No acute abnormality noted.

## 2020-07-13 MED ORDER — AZITHROMYCIN 250 MG PO TABS: ORAL_TABLET | ORAL | 0 refills | Status: DC

## 2020-07-13 MED ORDER — PREDNISONE 10 MG PO TABS: ORAL_TABLET | ORAL | 0 refills | Status: DC

## 2020-07-13 NOTE — Discharge Instructions (Signed)
Medication as prescribed.  Take care  Dr. Haille Pardi  

## 2020-07-13 NOTE — ED Provider Notes (Signed)
[facility]    CSN: [id number] Arrival date & time: 07/13/20  1021      History   Chief Complaint Chief Complaint  Patient presents with  . 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  Patient reports that she has been sick for the past week.  Reports cough.  Associated shortness of breath but she has some baseline shortness of breath due to her chronic conditions.  No relieving factors.  She has taken her home medications without relief.  No fever.  Husband also sick.  No other complaints or concerns at this time.  Past Medical History:  Diagnosis Date  . Anxiety   . Arthritis   . Asthma   . 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 Depression   . 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 Eosinophilic pneumonia [facility])     Patient Active Problem List   Diagnosis Date Noted  . Elevated troponin 03/02/2018  . Anxiety 03/02/2018  . Depression 03/02/2018  . GERD (gastroesophageal reflux disease) 03/02/2018  . HLD (hyperlipidemia) 03/02/2018  . 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 ENDOMETRIAL ABLATION    . TONSILLECTOMY      OB History   No obstetric history on file.      Home Medications    Prior to Admission medications   Medication Sig Start Date End Date Taking? Authorizing Provider  albuterol (PROVENTIL HFA;VENTOLIN HFA) 108 (90 Base) MCG/ACT inhaler Inhale into the lungs every 6 (six) hours as needed for wheezing or shortness of breath.   Yes [provider]  atorvastatin (LIPITOR) 10 MG tablet Take 10 mg by mouth at bedtime.    Yes [provider]  azithromycin (ZITHROMAX) 250 MG tablet 2 tablets on day 1, then 1 tablet daily on days 2-5. 07/13/20  Yes Dawanda Mapel G, DO  FLOVENT HFA 220 MCG/ACT inhaler Inhale 1 puff into the lungs 2 (two) times daily. 01/08/18  Yes [provider]   FLUoxetine (PROZAC) 20 MG capsule Take 20 mg by mouth daily. 01/11/18  Yes [provider]  metoprolol succinate (TOPROL-XL) 50 MG 24 hr tablet Take 25 mg by mouth at bedtime.  12/30/17  Yes [provider]  predniSONE (DELTASONE) 10 MG tablet 50 mg daily x 2 days, then 40 mg daily x 2 days, then 30 mg daily x 2 days, then 20 mg daily x 2 days, then 10 mg daily x 2 days. 07/13/20  Yes Larenda Reedy, Barnie Del, DO  Vitamin D, Ergocalciferol, (DRISDOL) 50000 units CAPS capsule Take 1 capsule by mouth once a week. Takes on Sunday 01/26/18  Yes [provider]  cyclobenzaprine (FLEXERIL) 10 MG tablet Take 1 tablet (10 mg total) by mouth 3 (three) times daily as needed. 03/02/18   Sable Feil, PA-C  ibuprofen (ADVIL,MOTRIN) 600 MG tablet Take 1 tablet (600 mg total) by mouth every 8 (eight) hours as needed. 03/02/18   Sable Feil, PA-C    Family History Family History  Problem Relation Age of Onset  . Aortic aneurysm Father   . Aortic aneurysm Brother   . Breast cancer Daughter 27    Social History Social History   Tobacco Use  . Smoking status: Never Smoker  . Smokeless tobacco: Never Used  Substance Use Topics  . Alcohol use: Never  . Drug use:  Never     Allergies   Pregabalin, Denosumab, Aspirin, Topiramate, Tramadol, and Amitriptyline   Review of Systems Review of Systems  Constitutional: Negative for fever.  Respiratory: Positive for cough and shortness of breath.    Physical Exam Triage Vital Signs ED Triage Vitals  Enc Vitals Group     BP 07/13/20 1248 130/74     Pulse Rate 07/13/20 1248 70     Resp 07/13/20 1248 18     Temp 07/13/20 1248 97.6 F (36.4 C)     Temp Source 07/13/20 1248 Oral     SpO2 07/13/20 1248 99 %     Weight 07/13/20 1120 175 lb 3.2 oz (79.5 kg)     Height 07/13/20 1120 5\' 4"  (1.626 m)     Head Circumference --      Peak Flow --      Pain Score 07/13/20 1120 0     Pain Loc --      Pain Edu? --      Excl. in [place]? --     Updated Vital Signs BP 130/74 (BP Location: Left Arm)   Pulse 70   Temp 97.6 F (36.4 C) (Oral)   Resp 18   Ht 5\' 4"  (1.626 m)   Wt 79.5 kg   SpO2 99%   BMI 30.07 kg/m   Visual Acuity Right Eye Distance:   Left Eye Distance:   Bilateral Distance:    Right Eye Near:   Left Eye Near:    Bilateral Near:     Physical Exam Constitutional:      General: She is not in acute distress. HENT:     Head: Normocephalic and atraumatic.  Eyes:     General:        Right eye: No discharge.        Left eye: No discharge.     Conjunctiva/sclera: Conjunctivae normal.  Cardiovascular:     Rate and Rhythm: Normal rate and regular rhythm.  Pulmonary:     Effort: Pulmonary effort is normal.     Breath sounds: Rales present.  Neurological:     Mental Status: She is alert.  Psychiatric:        Mood and Affect: Mood normal.        Behavior: Behavior normal.    UC Treatments / Results  Labs (all labs ordered are listed, but only abnormal results are displayed) Labs Reviewed  SARS CORONAVIRUS 2 (TAT 6-24 HRS)    EKG   Radiology DG Chest 2 View  Result Date: 07/13/2020 CLINICAL DATA:  Cough and shortness of breath EXAM: CHEST - 2 VIEW COMPARISON:  03/04/2018 FINDINGS: Cardiac shadow is within normal limits. Elevation of left hemidiaphragm is again seen with blunting of left costophrenic angle. The overall appearance is stable. No acute infiltrate or sizable effusion is noted. Chronic compression deformity is noted at the thoracolumbar junction. IMPRESSION: Chronic elevation of left hemidiaphragm. No acute abnormality noted. Electronically Signed   By: 09/10/2020 M.D.   On: 07/13/2020 13:22    Procedures Procedures (including critical care time)  Medications Ordered in UC Medications - No data to display  Initial Impression / Assessment and Plan / UC Course  I have reviewed the triage vital signs and the nursing notes.  Pertinent labs & imaging results that were available  during my care of the patient were reviewed by me and considered in my medical decision making (see chart for details).    69 year old female presents with bronchitis  which has led to asthma exacerbation.  Given patient's past medical history and lung findings, chest x-ray was obtained.  Chest x-ray was independently interpreted by me.  Interpretation: No acute findings.  Patient does have an elevated left hemidiaphragm which is chronic.  Placing on azithromycin and prednisone.  Supportive care.  Final Clinical Impressions(s) / UC Diagnoses   Final diagnoses:  Exacerbation of asthma, unspecified asthma severity, unspecified whether persistent  Acute bronchitis, unspecified organism     Discharge Instructions     Medication as prescribed.  Take care  Dr. Lacinda Axon    ED Prescriptions    Medication Sig Dispense Auth. Provider   azithromycin (ZITHROMAX) 250 MG tablet 2 tablets on day 1, then 1 tablet daily on days 2-5. 6 tablet Hildred Pharo G, DO   predniSONE (DELTASONE) 10 MG tablet 50 mg daily x 2 days, then 40 mg daily x 2 days, then 30 mg daily x 2 days, then 20 mg daily x 2 days, then 10 mg daily x 2 days. 30 tablet Coral Spikes, DO     PDMP not reviewed this encounter.   Coral Spikes, DO 07/13/20 1344

## 2020-07-13 NOTE — ED Triage Notes (Signed)
Pt c/o cough, shortness of breath. Started about a week ago. Denies fever.

## 2020-07-27 ENCOUNTER — Ambulatory Visit: Payer: Medicare Other | Admitting: Family Medicine

## 2020-12-30 DIAGNOSIS — U071 COVID-19: Secondary | ICD-10-CM

## 2020-12-30 HISTORY — DX: COVID-19: U07.1

## 2021-01-07 ENCOUNTER — Ambulatory Visit
Admission: EM | Admit: 2021-01-07 | Discharge: 2021-01-07 | Disposition: A | Discharge status: Home / Self Care | Payer: Medicare Other | Attending: Family Medicine | Admitting: Family Medicine

## 2021-01-07 ENCOUNTER — Other Ambulatory Visit: Payer: Self-pay

## 2021-01-07 ENCOUNTER — Encounter: Payer: Self-pay | Admitting: Emergency Medicine

## 2021-01-07 ENCOUNTER — Ambulatory Visit (INDEPENDENT_AMBULATORY_CARE_PROVIDER_SITE_OTHER): Payer: Medicare Other

## 2021-01-07 DIAGNOSIS — U071 COVID-19: Secondary | ICD-10-CM | POA: Diagnosis not present

## 2021-01-07 DIAGNOSIS — R059 Cough, unspecified: Secondary | ICD-10-CM | POA: Diagnosis not present

## 2021-01-07 IMAGING — CR DG CHEST 2V
2 series · 2 of 2 positions shown · non-contrast
Comparison: [DATE]

CLINICAL DATA: Cough.  Recent COVID.

EXAM:
CHEST - 2 VIEW

[chest pa]
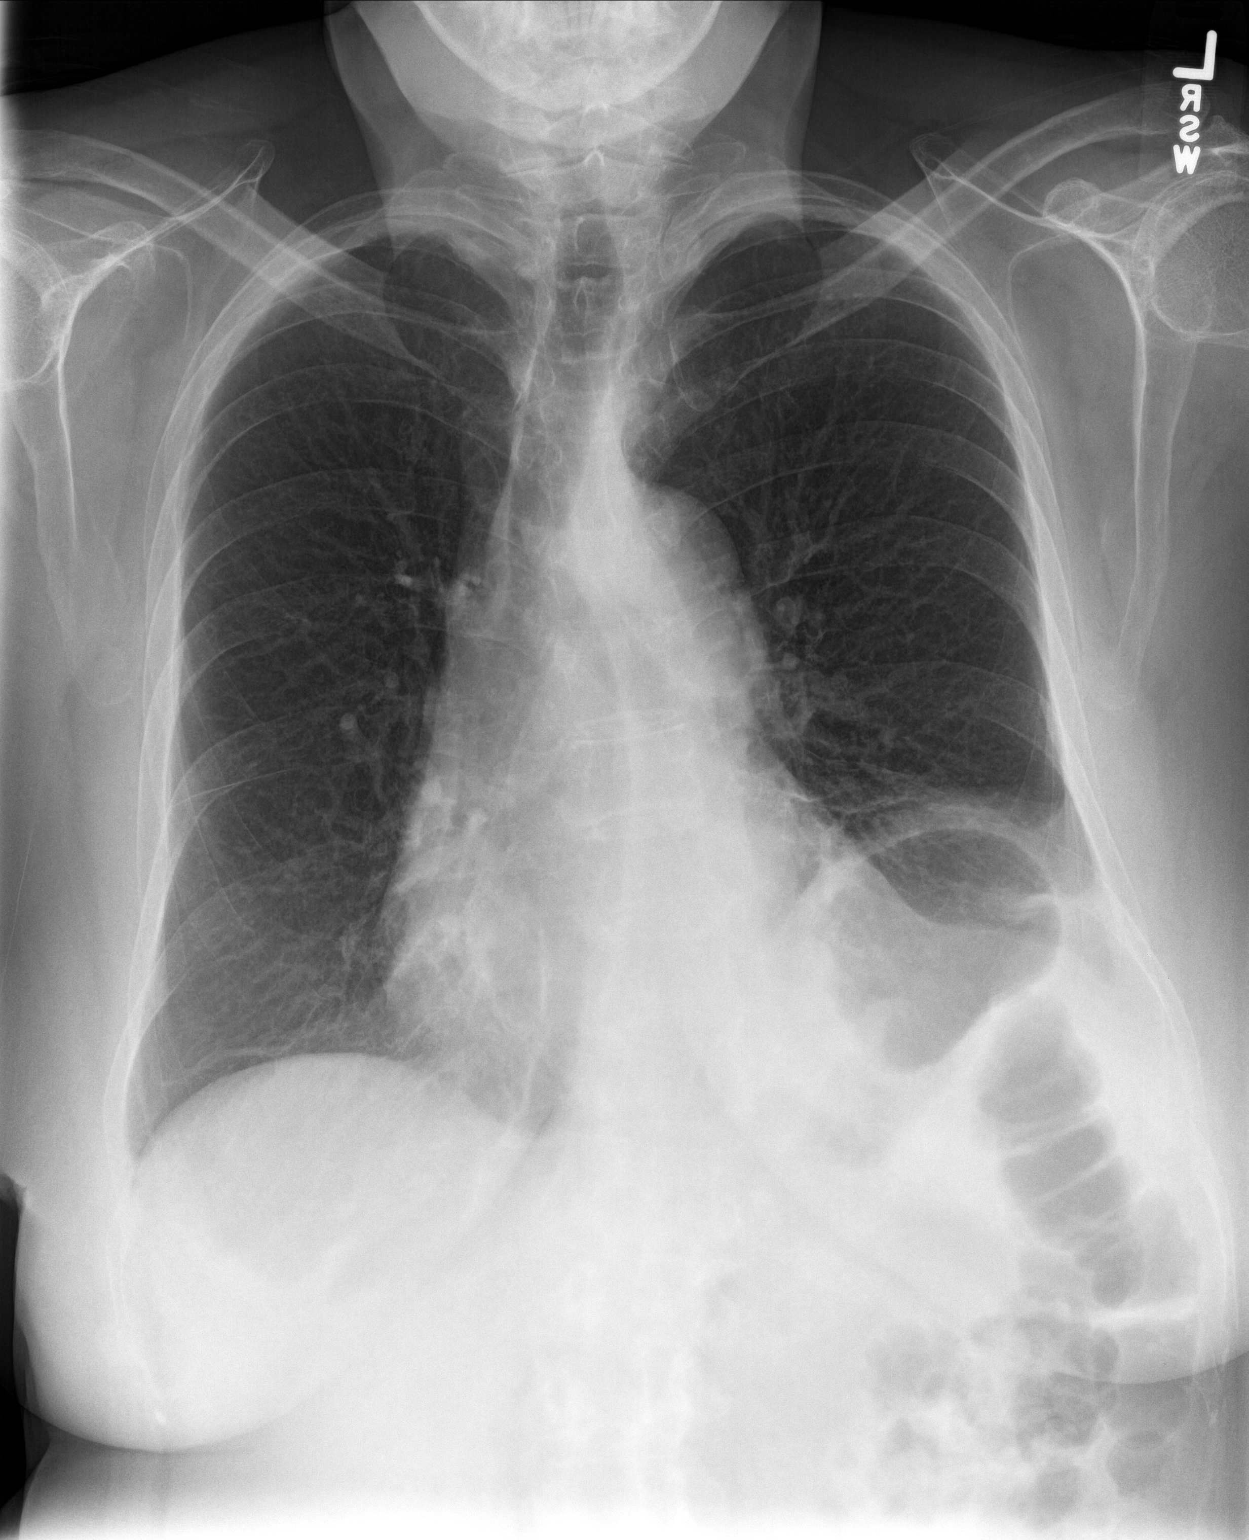

[chest lat]
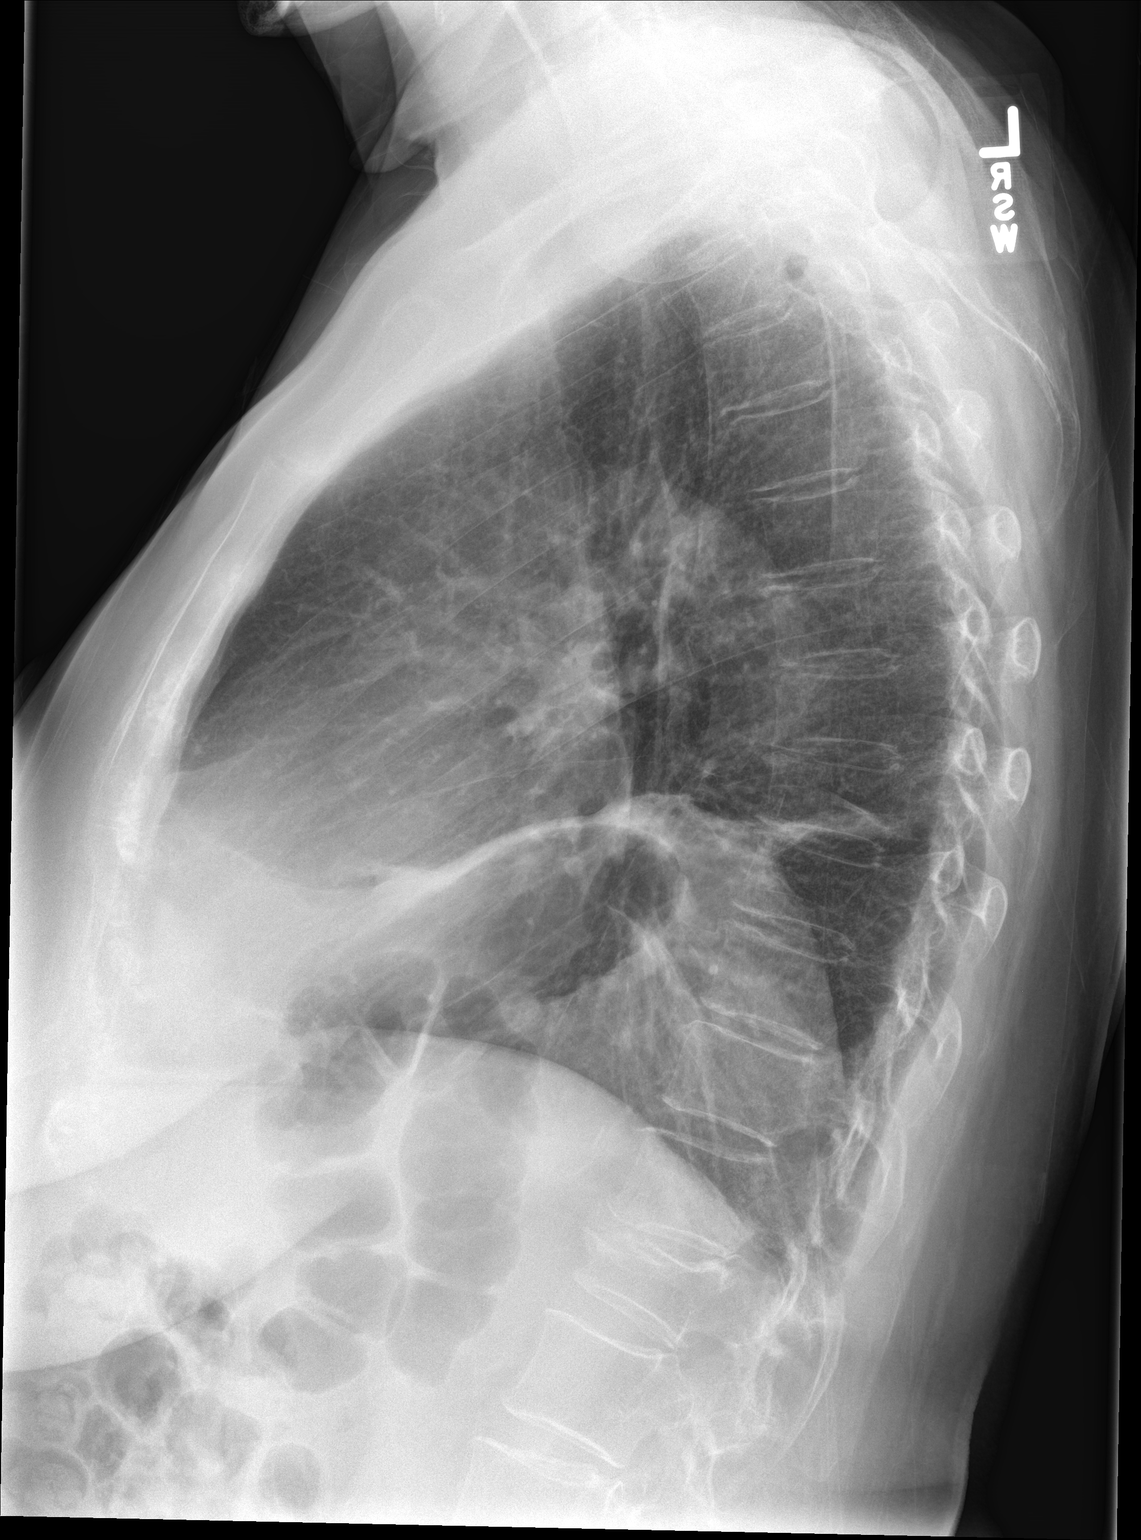

[2 of 2 positions shown; findings below may reference images not displayed]

FINDINGS: Asymmetric elevation left hemidiaphragm, stable. The lungs are clear
without focal pneumonia, edema, pneumothorax or pleural effusion.
Interstitial markings are diffusely coarsened with chronic features.
Chronic atelectasis or scarring noted posterior left lower lobe.
Bones are diffusely demineralized.
IMPRESSION: Stable.  No acute findings.

## 2021-01-07 MED ORDER — PREDNISONE 10 MG PO TABS: ORAL_TABLET | ORAL | 0 refills | Status: DC

## 2021-01-07 MED ORDER — PROMETHAZINE-DM 6.25-15 MG/5ML PO SYRP: 5.0000 mL | ORAL_SOLUTION | Freq: Four times a day (QID) | ORAL | 0 refills | Status: DC | PRN

## 2021-01-07 NOTE — ED Provider Notes (Signed)
[facility]    CSN: [id number] Arrival date & time: 01/07/21  1017      History   Chief Complaint Chief Complaint  Patient presents with   Shortness of Breath   Cough   Headache    HPI  70 year old female presents with the above complaints.  Patient followed by pulmonology.  Has a history of eosinophilic pneumonia.  Patient recently diagnosed with COVID-19 on 6/21.  Patient reports that she initially had sore throat and fatigue.  Subsequently developed cough.  She states that she was treated with molnupiravir, azithromycin, and prednisone.  Initially improved but then worsened again.  She is currently bothered by dry cough.  Some associated shortness of breath.  No current fever.  Patient was reports a recent bug bite to her left calf.  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      Home Medications    Prior to Admission medications   Medication Sig Start Date End Date Taking? Authorizing Provider  predniSONE (DELTASONE) 10 MG tablet 50 mg daily x 2 days, then 40 mg daily x 2 days, then 30 mg daily x 2 days, then 20 mg daily x 2 days, then 10 mg daily x 2 days. 01/07/21  Yes Slade Pierpoint G, DO  promethazine-dextromethorphan (PROMETHAZINE-DM) 6.25-15 MG/5ML syrup Take 5 mLs by mouth 4 (four) times daily as needed for cough. 01/07/21  Yes Zamyra Allensworth G, DO  albuterol  (PROVENTIL HFA;VENTOLIN HFA) 108 (90 Base) MCG/ACT inhaler Inhale into the lungs every 6 (six) hours as needed for wheezing or shortness of breath.    [provider]  atorvastatin (LIPITOR) 10 MG tablet Take 10 mg by mouth at bedtime.     [provider]  cyclobenzaprine (FLEXERIL) 10 MG tablet Take 1 tablet (10 mg total) by mouth 3 (three) times daily as needed. 03/02/18   Sable Feil, PA-C  FLOVENT HFA 220 MCG/ACT inhaler Inhale 1 puff into the lungs 2 (two) times daily. 01/08/18   [provider]  FLUoxetine (PROZAC) 20 MG capsule Take 20 mg by mouth daily. 01/11/18   [provider]  ibuprofen (ADVIL,MOTRIN) 600 MG tablet Take 1 tablet (600 mg total) by mouth every 8 (eight) hours as needed. 03/02/18   Sable Feil, PA-C  LAGEVRIO 200 MG CAPS Take 4 capsules by mouth 2 (two) times daily. 12/30/20   [provider]  metoprolol succinate (TOPROL-XL) 50 MG 24 hr tablet Take 25 mg by mouth at bedtime.  12/30/17   [provider]  Vitamin D, Ergocalciferol, (DRISDOL) 50000 units CAPS capsule Take 1 capsule by mouth once a week. Takes on Sunday 01/26/18   [provider]    Family History Family History  Problem Relation Age of Onset   Aortic aneurysm Father  Aortic aneurysm Brother    Breast cancer Daughter 39    Social History Social History   Tobacco Use   Smoking status: Never   Smokeless tobacco: Never  Substance Use Topics   Alcohol use: Never   Drug use: Never     Allergies   Pregabalin, Denosumab, Aspirin, Topiramate, Tramadol, and Amitriptyline   Review of Systems Review of Systems Per HPI  Physical Exam Triage Vital Signs ED Triage Vitals [01/07/21 1112]  Enc Vitals Group     BP (!) 132/59     Pulse Rate 63     Resp (!) 22     Temp 98.3 F (36.8 C)     Temp src      SpO2 97 %     Weight      Height      Head Circumference      Peak Flow      Pain Score 0     Pain Loc      Pain Edu?       Excl. in [place]?    No data found.  Updated Vital Signs BP (!) 132/59 (BP Location: Right Arm)   Pulse 63   Temp 98.3 F (36.8 C)   Resp (!) 22   SpO2 97%   Visual Acuity Right Eye Distance:   Left Eye Distance:   Bilateral Distance:    Right Eye Near:   Left Eye Near:    Bilateral Near:     Physical Exam Vitals and nursing note reviewed.  Constitutional:      General: She is not in acute distress. HENT:     Head: Normocephalic and atraumatic.  Eyes:     General:        Right eye: No discharge.        Left eye: No discharge.     Conjunctiva/sclera: Conjunctivae normal.  Cardiovascular:     Rate and Rhythm: Normal rate and regular rhythm.  Pulmonary:     Comments: Slightly increased respiratory rate.  Basilar crackles noted. Neurological:     Mental Status: She is alert.  Psychiatric:        Mood and Affect: Mood normal.        Behavior: Behavior normal.     UC Treatments / Results  Labs (all labs ordered are listed, but only abnormal results are displayed) Labs Reviewed - No data to display  EKG   Radiology DG Chest 2 View  Result Date: 01/07/2021 CLINICAL DATA:  Cough.  Recent COVID. EXAM: CHEST - 2 VIEW COMPARISON:  07/13/2020 FINDINGS: Asymmetric elevation left hemidiaphragm, stable. The lungs are clear without focal pneumonia, edema, pneumothorax or pleural effusion. Interstitial markings are diffusely coarsened with chronic features. Chronic atelectasis or scarring noted posterior left lower lobe. Bones are diffusely demineralized. IMPRESSION: Stable.  No acute findings. Electronically Signed   By: Misty Stanley M.D.   On: 01/07/2021 12:46    Procedures Procedures (including critical care time)  Medications Ordered in UC Medications - No data to display  Initial Impression / Assessment and Plan / UC Course  I have reviewed the triage vital signs and the nursing notes.  Pertinent labs & imaging results that were available during my care of the patient  were reviewed by me and considered in my medical decision making (see chart for details).    70 year old female presents with recent COVID-19.  Now having persistent cough.  Chest x-ray was obtained and was independently reviewed by me.  Interpretation:  No acute findings.  Her chest x-ray is stable from prior.  There is no evidence of pneumonia.  Treating with prednisone and Promethazine DM.  Final Clinical Impressions(s) / UC Diagnoses   Final diagnoses:  Cough  COVID     Discharge Instructions      Medications as prescribed.  Rest.   If you worsen, go to the ER.  Follow up with your Pulmonologist.    ED Prescriptions     Medication Sig Dispense Auth. Provider   predniSONE (DELTASONE) 10 MG tablet 50 mg daily x 2 days, then 40 mg daily x 2 days, then 30 mg daily x 2 days, then 20 mg daily x 2 days, then 10 mg daily x 2 days. 30 tablet Jettson Crable G, DO   promethazine-dextromethorphan (PROMETHAZINE-DM) 6.25-15 MG/5ML syrup Take 5 mLs by mouth 4 (four) times daily as needed for cough. 118 mL Coral Spikes, DO      PDMP not reviewed this encounter.   Coral Spikes, [place] 01/07/21 1512

## 2021-01-07 NOTE — Discharge Instructions (Addendum)
Medications as prescribed.  Rest.   If you worsen, go to the ER.  Follow up with your Pulmonologist.

## 2021-01-07 NOTE — ED Triage Notes (Addendum)
Pt presents today with c/o of SOB with cough x 10 days. She reports testing positive to Covid approx 10 days ago and has antiviral, steroid and zithromax course with no relief. Pt also c/o of bug bite to left calf area.

## 2021-07-18 ENCOUNTER — Other Ambulatory Visit: Payer: Self-pay | Admitting: Physical Medicine & Rehabilitation

## 2021-07-18 DIAGNOSIS — M545 Low back pain, unspecified: Secondary | ICD-10-CM

## 2021-07-18 DIAGNOSIS — G8929 Other chronic pain: Secondary | ICD-10-CM

## 2021-07-26 ENCOUNTER — Ambulatory Visit: Payer: Medicare Other

## 2021-08-03 ENCOUNTER — Ambulatory Visit
Admission: RE | Admit: 2021-08-03 | Discharge: 2021-08-03 | Disposition: A | Discharge status: Home / Self Care | Payer: Medicare Other | Source: Ambulatory Visit | Attending: Physical Medicine & Rehabilitation | Admitting: Physical Medicine & Rehabilitation

## 2021-08-03 ENCOUNTER — Other Ambulatory Visit: Payer: Self-pay

## 2021-08-03 DIAGNOSIS — M546 Pain in thoracic spine: Secondary | ICD-10-CM | POA: Diagnosis present

## 2021-08-03 DIAGNOSIS — G8929 Other chronic pain: Secondary | ICD-10-CM

## 2021-08-03 DIAGNOSIS — M545 Low back pain, unspecified: Secondary | ICD-10-CM | POA: Diagnosis present

## 2021-08-03 IMAGING — MR MR THORACIC SPINE W/O CM
6 series · 29 of 48 positions shown · non-contrast
Comparison: None.

CLINICAL DATA: Chronic low back pain for years.

EXAM:
MRI THORACIC SPINE WITHOUT CONTRAST
TECHNIQUE: Multiplanar, multisequence MR imaging of the thoracic spine was
performed. No intravenous contrast was administered.

[Series 16: T1 · sagittal · 5.0mm · 1.88mm/px · 2 of 9 slices shown (1 of 2)]
[im 1/9]
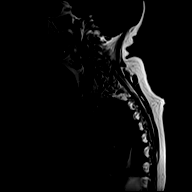
[im 9/9]
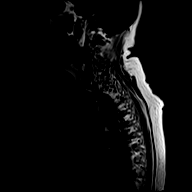

[Series 17: T2 · sagittal · 3.0mm · 1.06mm/px · 6 of 17 slices shown (1 of 2)]
[im 1/17]
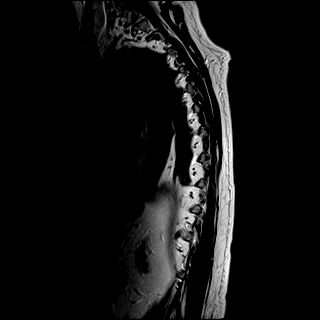
[im 4/17]
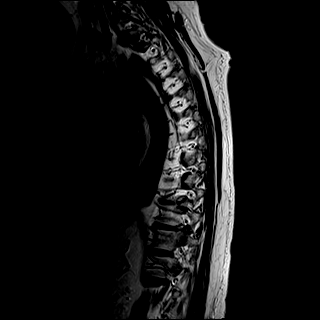
[im 7/17]
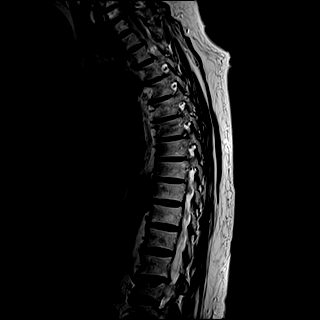
[im 10/17]
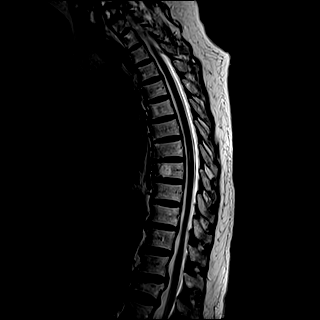
[im 13/17]
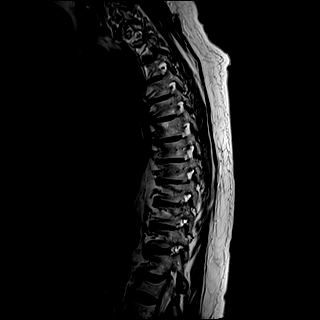
[im 17/17]
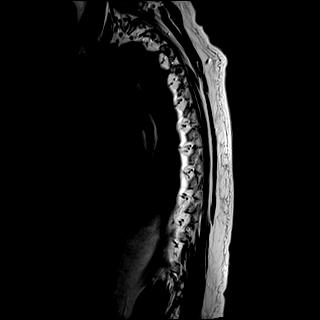

[Series 18: T1 · sagittal · 3.0mm · 1.06mm/px · 6 of 17 slices shown (2 of 2)]
[im 1/17]
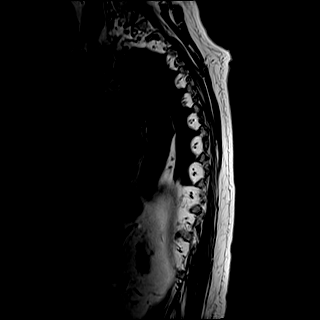
[im 4/17]
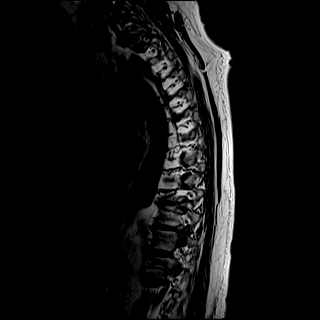
[im 7/17]
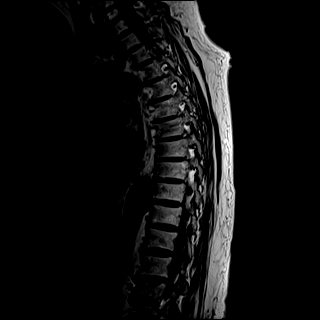
[im 10/17]
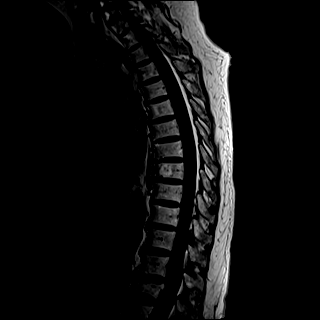
[im 13/17]
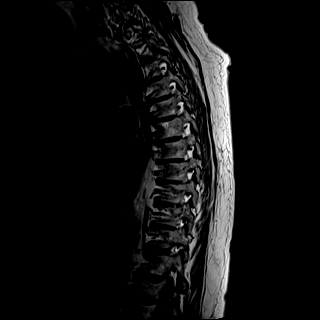
[im 17/17]
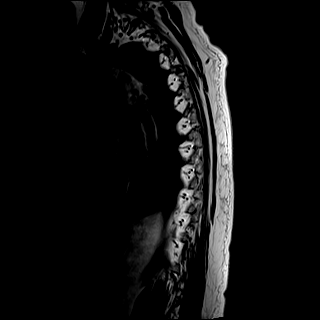

[Series 19: STIR · sagittal · 3.0mm · 0.53mm/px · 6 of 17 slices shown]
[im 1/17]
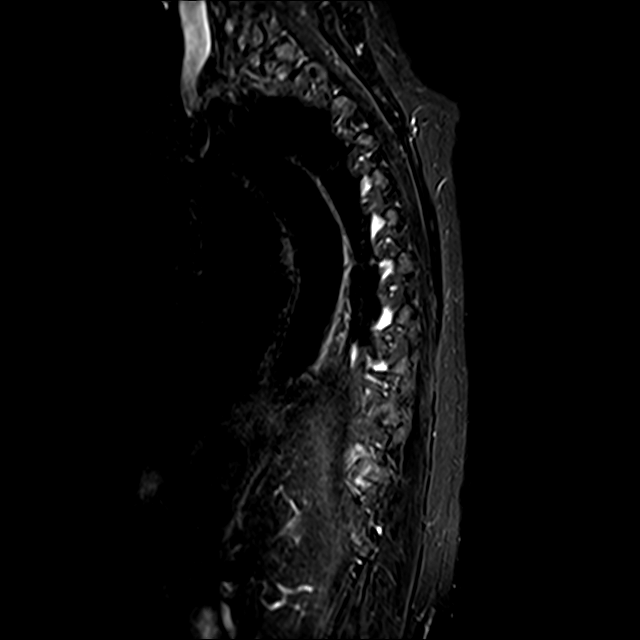
[im 4/17]
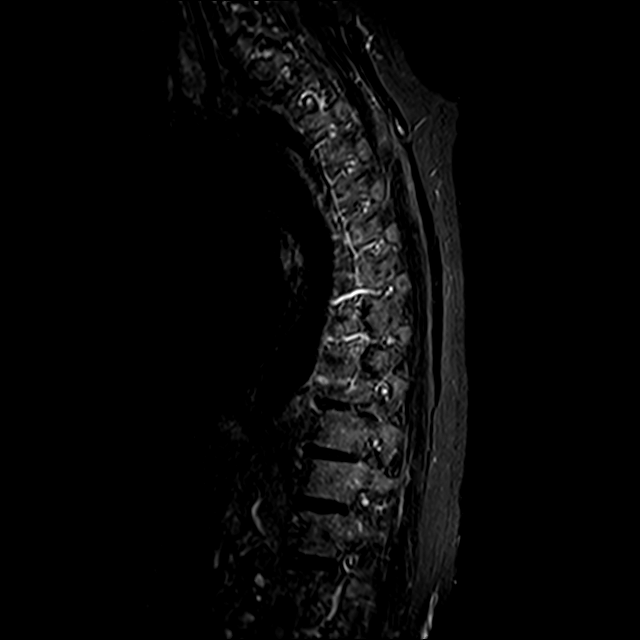
[im 7/17]
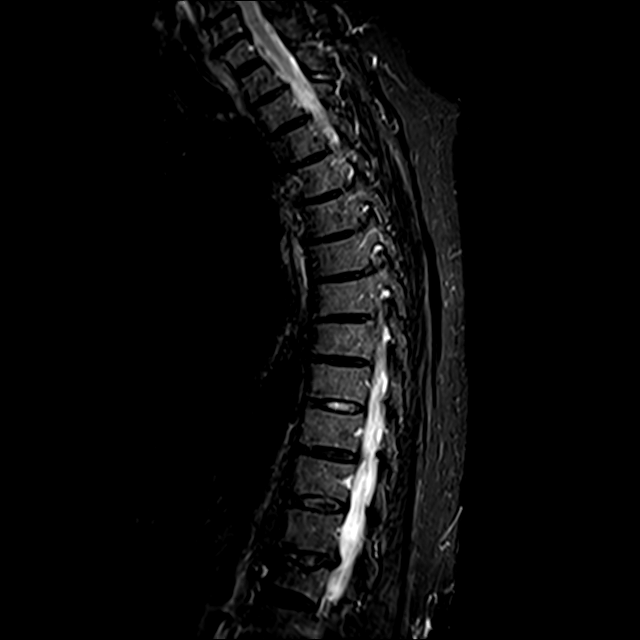
[im 10/17]
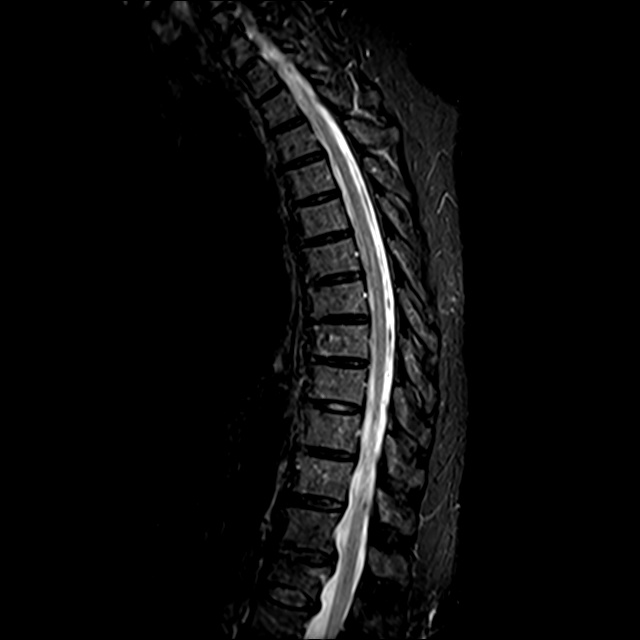
[im 13/17]
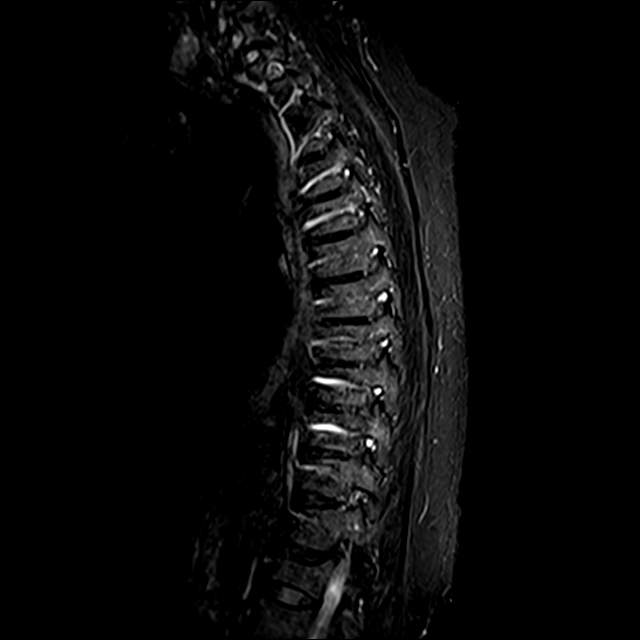
[im 17/17]
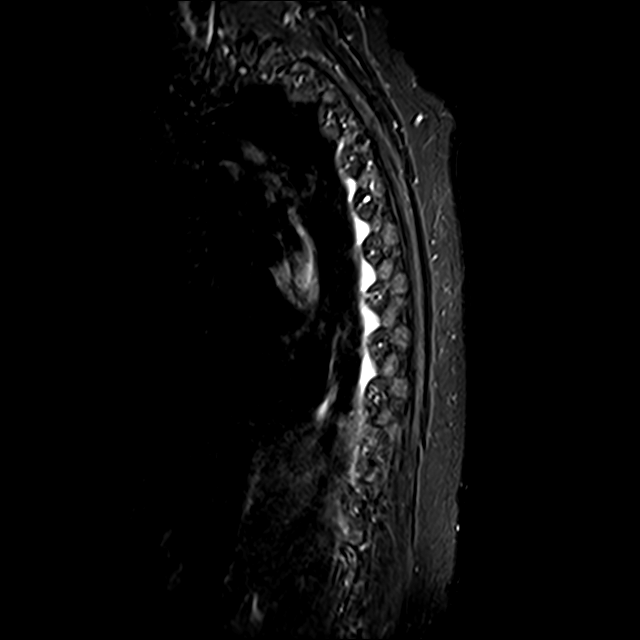

[Series 20: T2 · axial · 4.0mm · 0.59mm/px · z∈[-335,-127]mm · 8 of 39 slices shown (2 of 2)]
[im 1/39]
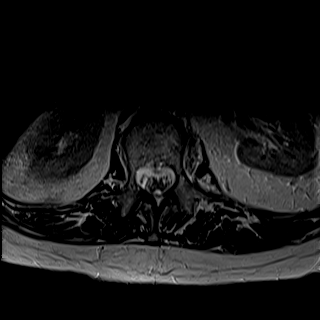
[im 6/39]
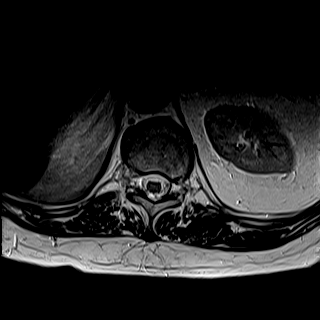
[im 12/39]
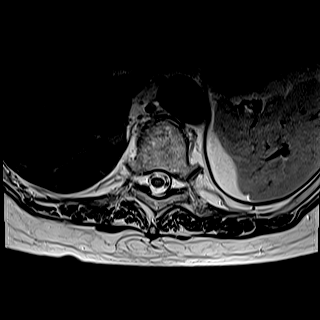
[im 18/39]
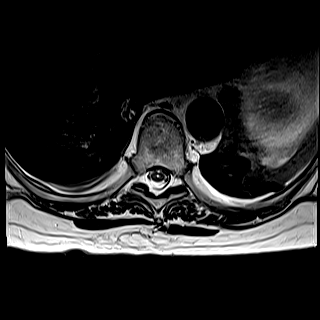
[im 21/39]
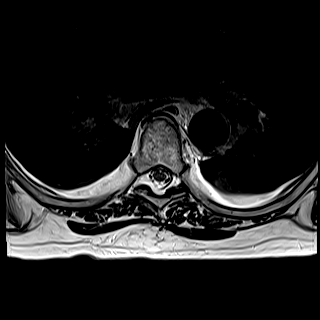
[im 27/39]
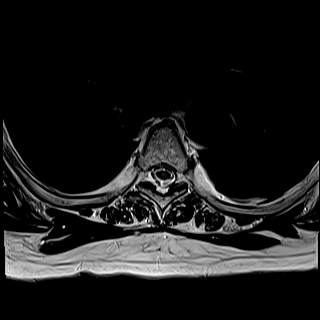
[im 33/39]
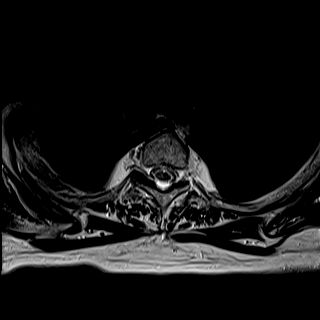
[im 39/39]
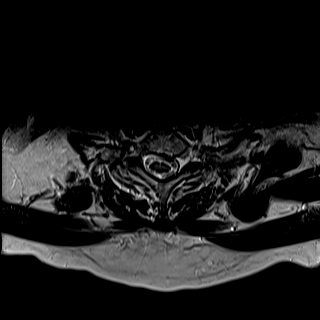

[Series 21: GRE · axial · 4.0mm · 0.37mm/px · 1 of 39 slices shown]
[im 1/39]
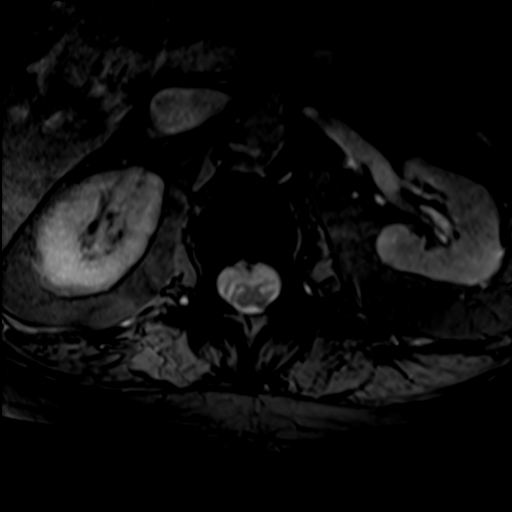

[29 of 48 positions shown; findings below may reference images not displayed]

FINDINGS: Alignment:  Physiologic.

Vertebrae: No acute fracture, evidence of discitis, or aggressive
bone lesion. Chronic L1 vertebral body compression fracture. T8
vertebral body hemangioma.

Cord: Focal T2 hyperintense central signal abnormality at the level
of T7-8 which may reflect a prominent central canal versus a focal
lesion.

Paraspinal and other soft tissues: Negative.

Disc levels:

Disc spaces:  Disc desiccation throughout the thoracic spine.

T1-T2: Minimal broad-based disc bulge. No foraminal or central canal
stenosis.

T2-T3: Mild broad-based disc bulge with a tiny central disc
protrusion. No foraminal or central canal stenosis.

T3-T4: Minimal broad-based disc bulge. No foraminal or central canal
stenosis.

T4-T5: No disc protrusion, foraminal stenosis or central canal
stenosis.

T5-T6: No disc protrusion, foraminal stenosis or central canal
stenosis.

T6-T7: No disc protrusion, foraminal stenosis or central canal
stenosis.

T7-T8: No disc protrusion, foraminal stenosis or central canal
stenosis.

T8-T9: No disc protrusion, foraminal stenosis or central canal
stenosis.

T9-T10: No disc protrusion, foraminal stenosis or central canal
stenosis.

T10-T11: Small left paracentral disc protrusion. No foraminal or
central canal stenosis.

T11-T12: No disc protrusion, foraminal stenosis or central canal
stenosis.

T12-L1: Broad-based disc bulge. No foraminal or central canal
stenosis.

L1-2: Small central disc protrusion. No foraminal or central canal
stenosis.
IMPRESSION: 1.  No acute osseous injury of the thoracic spine.
2. Focal T2 hyperintense central signal abnormality at the level of
T7-8 which may reflect a prominent central canal versus a focal
lesion. Recommend further evaluation with a MRI of the thoracic
spine with intravenous contrast.

## 2021-08-03 IMAGING — MR MR LUMBAR SPINE W/O CM
5 series · 31 of 48 positions shown · non-contrast
Comparison: X-ray [DATE].  CT angiogram report dated [DATE]

CLINICAL DATA: Chronic low back pain without radiculopathy

EXAM:
MRI LUMBAR SPINE WITHOUT CONTRAST
TECHNIQUE: Multiplanar, multisequence MR imaging of the lumbar spine was
performed. No intravenous contrast was administered.

[Series 16: T2 · sagittal · 4.0mm · 0.81mm/px · 6 of 17 slices shown (1 of 2)]
[im 1/17]
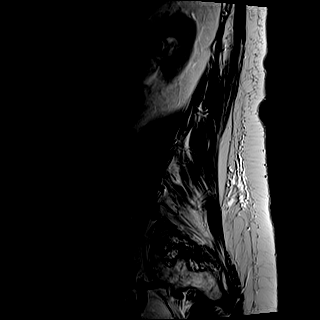
[im 4/17]
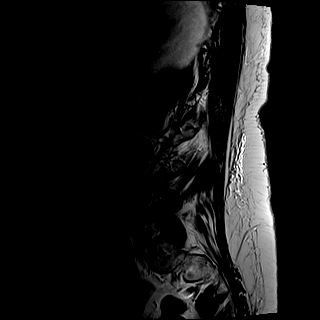
[im 7/17]
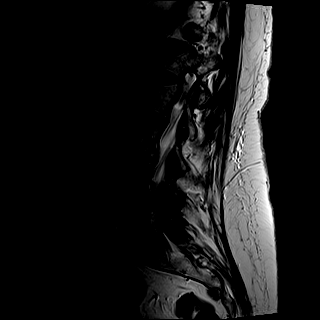
[im 10/17]
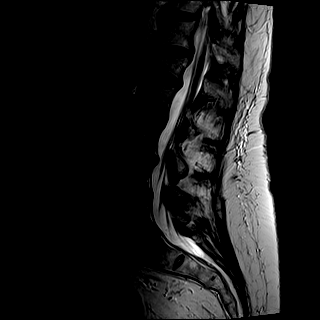
[im 13/17]
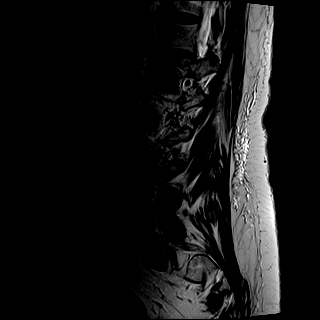
[im 17/17]
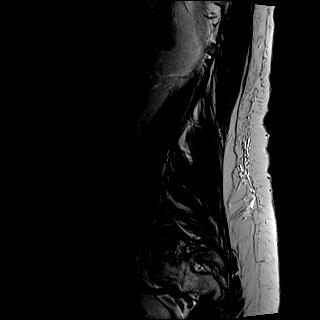

[Series 17: T1 · sagittal · 4.0mm · 0.81mm/px · 7 of 17 slices shown (1 of 2)]
[im 1/17]
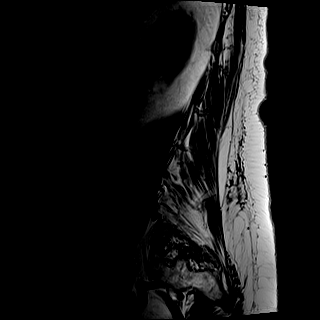
[im 3/17]
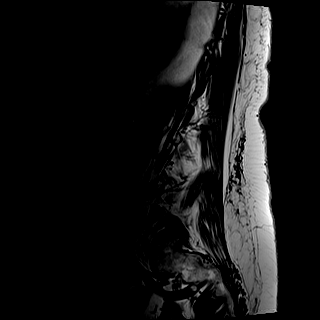
[im 6/17]
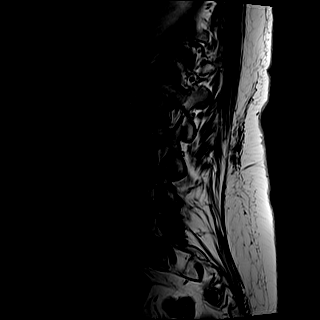
[im 9/17]
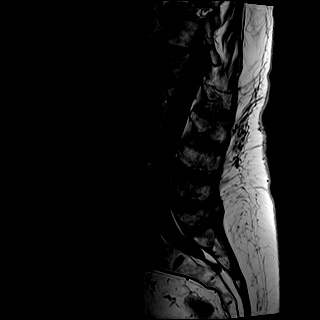
[im 11/17]
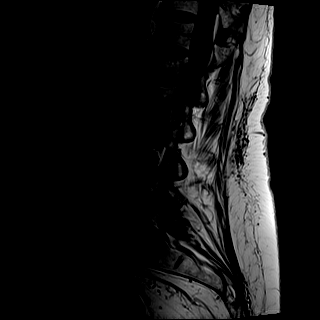
[im 14/17]
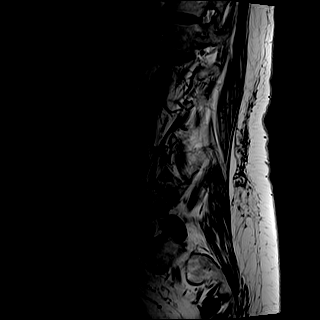
[im 17/17]
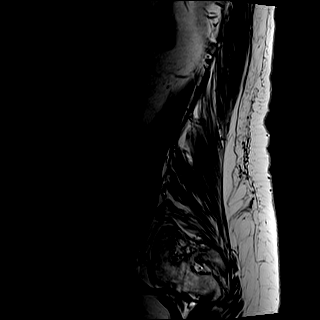

[Series 18: STIR · sagittal · 4.0mm · 0.41mm/px · 2 of 17 slices shown]
[im 1/17]
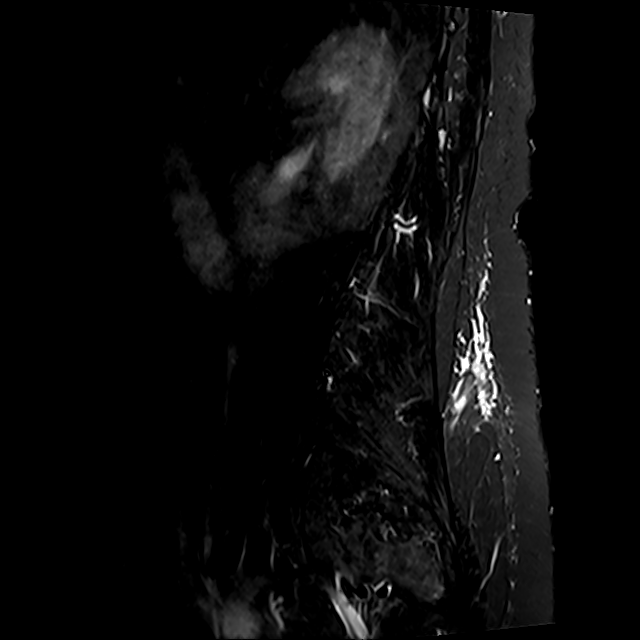
[im 3/17]
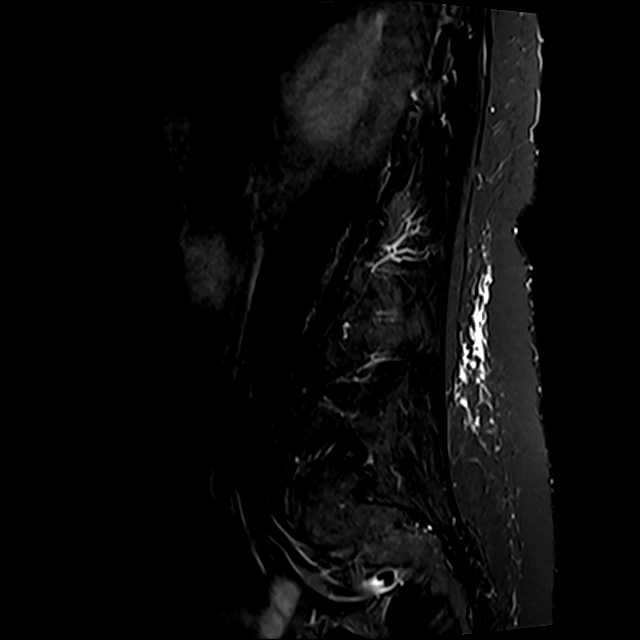

[Series 19: T2 · axial · 4.0mm · 0.78mm/px · z∈[-535,-335]mm · 8 of 34 slices shown (2 of 2)]
[im 1/34]
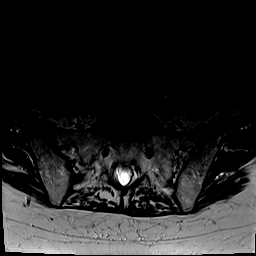
[im 6/34]
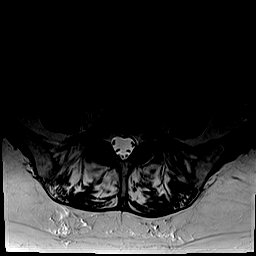
[im 11/34]
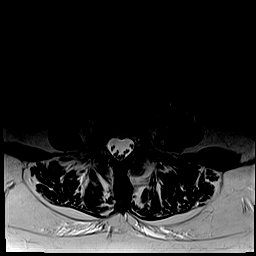
[im 16/34]
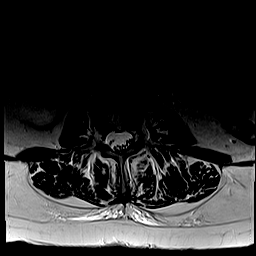
[im 18/34]
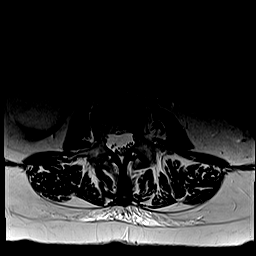
[im 23/34]
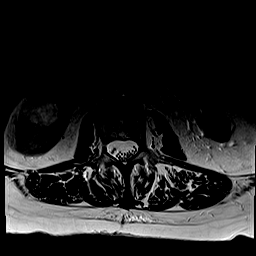
[im 28/34]
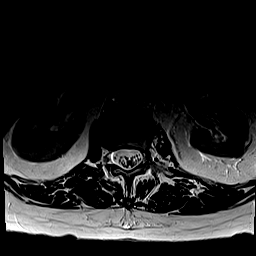
[im 34/34]
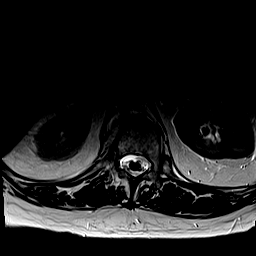

[Series 20: T1 · axial · 4.0mm · 0.39mm/px · z∈[-535,-335]mm · 8 of 34 slices shown (2 of 2)]
[im 1/34]
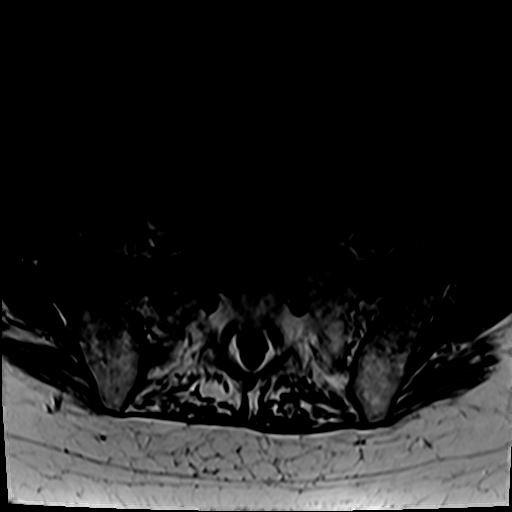
[im 6/34]
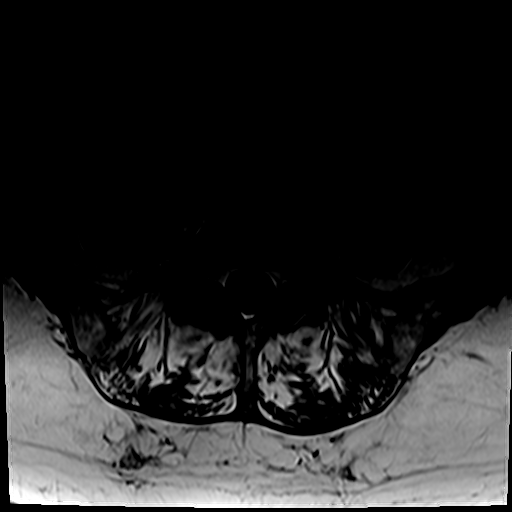
[im 11/34]
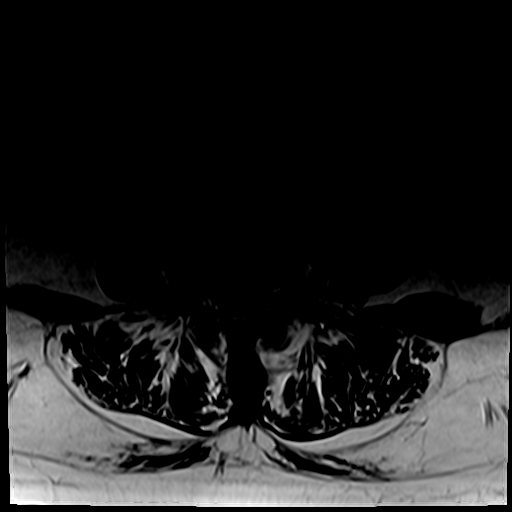
[im 16/34]
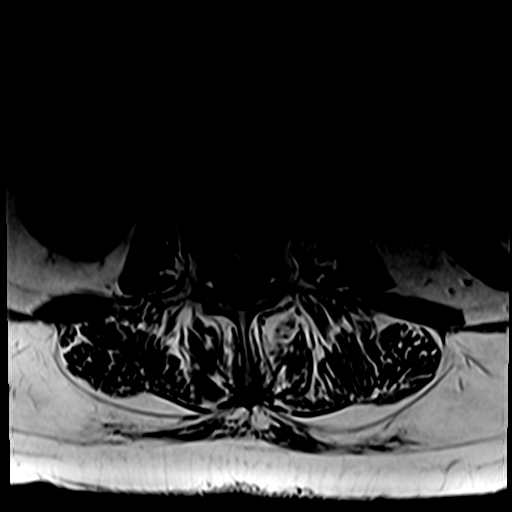
[im 18/34]
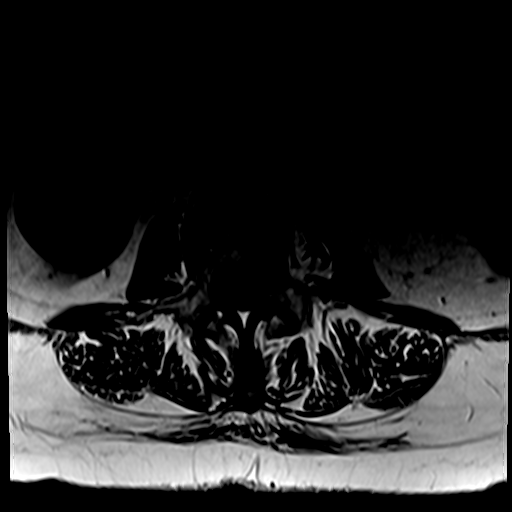
[im 23/34]
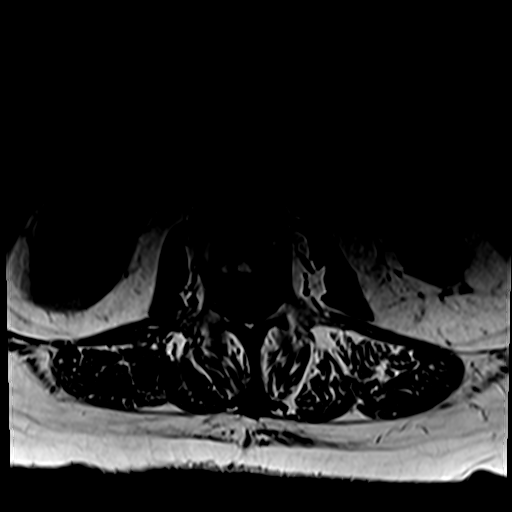
[im 28/34]
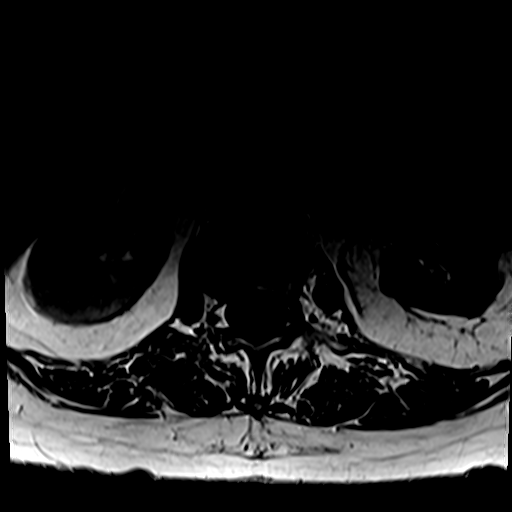
[im 34/34]
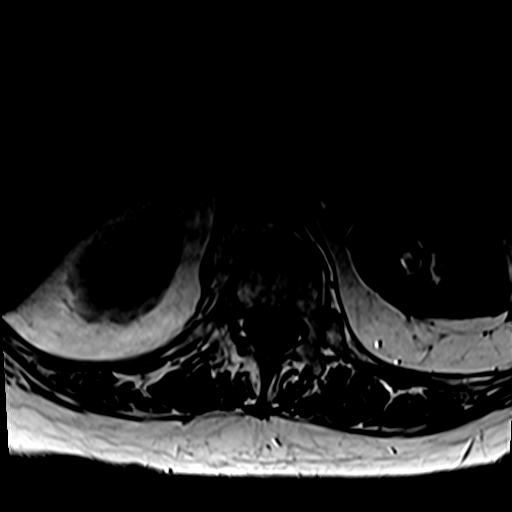

[31 of 48 positions shown; findings below may reference images not displayed]

FINDINGS: Segmentation:  Standard.

Alignment:  Mild lumbar dextrocurvature.  No static listhesis.

Vertebrae: Chronic mild superior endplate compression fracture of
L1. No acute fracture. No evidence of discitis. Scattered benign
intraosseous hemangiomas. No suspicious marrow replacing bone
lesion.

Conus medullaris and cauda equina: Conus extends to the L1-2 level.
Conus and cauda equina appear normal.

Paraspinal and other soft tissues: Abdominal aortic dissection
extending into the left common iliac artery, not well evaluated by
technique. By report this is a chronic finding. Large right renal
cyst.

Disc levels:

T12-L1: Mild diffuse disc bulge. Facet joints within normal limits.
No foraminal or canal stenosis.

L1-L2: Shallow central disc protrusion. Facet joints within normal
limits. No foraminal or canal stenosis.

L2-L3: Negative.

L3-L4: Shallow left foraminal protrusion. Minimal facet hypertrophy
and ligamentum flavum buckling. No significant foraminal stenosis.
No canal stenosis.

L4-L5: Minimal annular disc bulge. Mild bilateral facet arthropathy
and ligamentum flavum buckling. No foraminal or canal stenosis.

L5-S1: Shallow disc bulge. Mild right greater than left facet
arthropathy. No foraminal or canal stenosis.
IMPRESSION: 1. Mild lumbar spondylosis without significant foraminal or canal
stenosis at any level.
2. Chronic mild superior endplate compression fracture of L1.
3. Abdominal aortic dissection extending into the left common iliac
artery, not well evaluated by technique. By report, this is a
chronic finding.

## 2021-08-05 ENCOUNTER — Other Ambulatory Visit: Payer: Self-pay | Admitting: Physical Medicine & Rehabilitation

## 2021-08-05 DIAGNOSIS — C7951 Secondary malignant neoplasm of bone: Secondary | ICD-10-CM

## 2021-08-09 ENCOUNTER — Ambulatory Visit
Admission: RE | Admit: 2021-08-09 | Discharge: 2021-08-09 | Disposition: A | Discharge status: Home / Self Care | Payer: Medicare Other | Source: Ambulatory Visit | Attending: Physical Medicine & Rehabilitation | Admitting: Physical Medicine & Rehabilitation

## 2021-08-09 DIAGNOSIS — C7951 Secondary malignant neoplasm of bone: Secondary | ICD-10-CM | POA: Diagnosis present

## 2021-08-09 IMAGING — MR MR THORACIC SPINE WO/W CM
6 of 9 series · 26 of 48 positions shown · IV contrast (gadavist)
Comparison: Noncontrast thoracic spine MRI [DATE]. Chest
radiographs [DATE].

CLINICAL DATA: 70-year-old female with small central spinal cord
lesion at T7-T8 on MRI last month. History of chronic back pain.

EXAM:
MRI THORACIC WITHOUT AND WITH CONTRAST
TECHNIQUE: Multiplanar and multiecho pulse sequences of the thoracic spine were
obtained without and with intravenous contrast.
CONTRAST:  7.5mL GADAVIST GADOBUTROL 1 MMOL/ML IV SOLN

[Series 16: T1 · sagittal · 5.0mm · 1.41mm/px · 1 of 9 slices shown (1 of 3)]
[im 1/9]
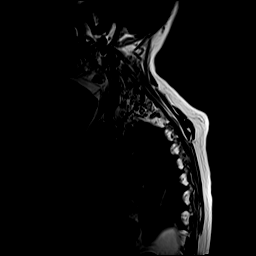

[Series 17: T2 · sagittal · 3.0mm · 1.06mm/px · 3 of 19 slices shown (1 of 2)]
[im 1/19]
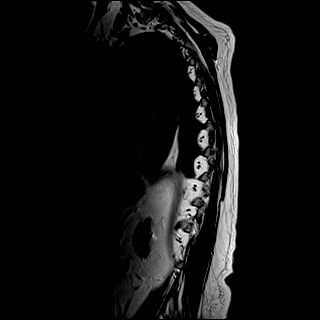
[im 10/19]
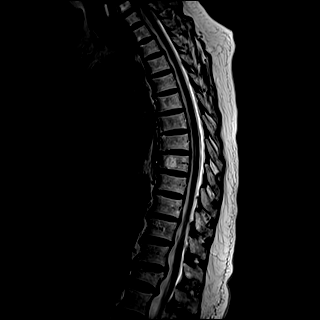
[im 19/19]
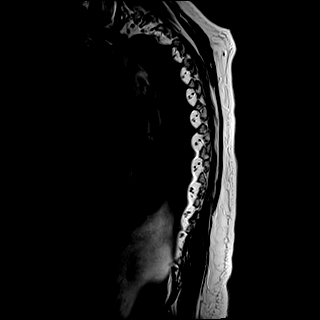

[Series 18: T1 · sagittal · 3.0mm · 1.06mm/px · 4 of 19 slices shown (2 of 3)]
[im 1/19]
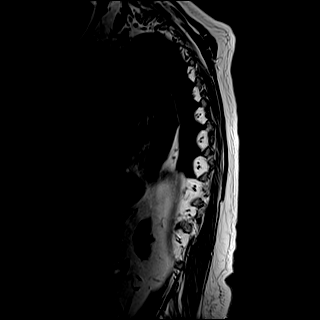
[im 7/19]
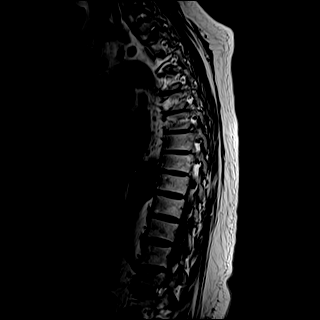
[im 13/19]
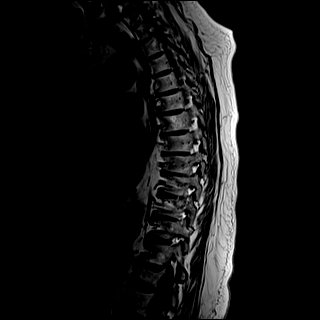
[im 19/19]
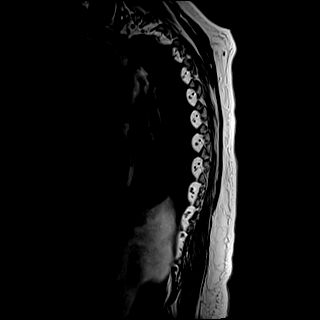

[Series 20: T2 · axial · 4.0mm · 0.59mm/px · z∈[-334,-143]mm · 8 of 41 slices shown (2 of 2)]
[im 1/41]
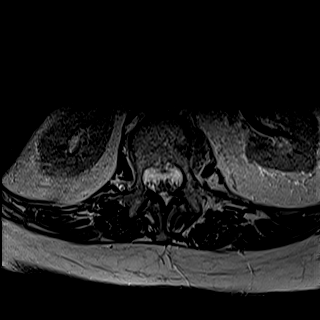
[im 6/41]
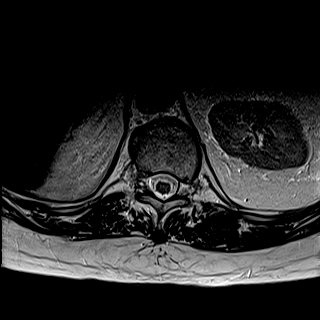
[im 12/41]
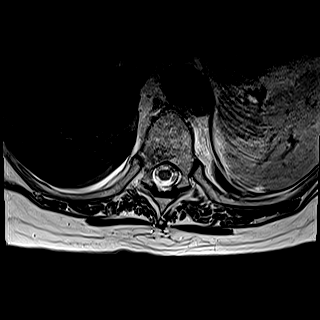
[im 18/41]
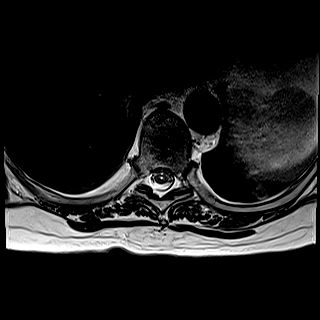
[im 23/41]
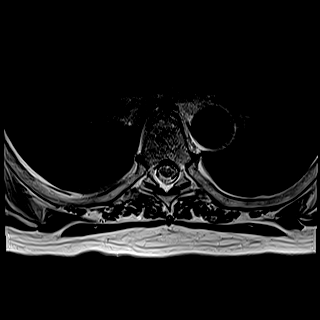
[im 29/41]
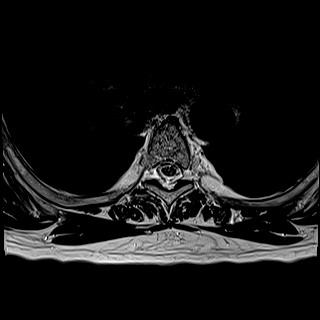
[im 35/41]
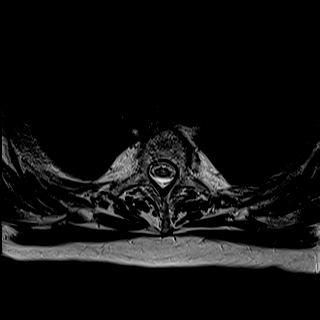
[im 41/41]
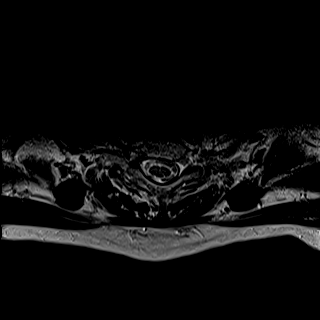

[Series 22: T1 · axial · non-contrast · 4.0mm · 0.31mm/px · z∈[-334,-143]mm · 8 of 41 slices shown (3 of 3)]
[im 1/41]
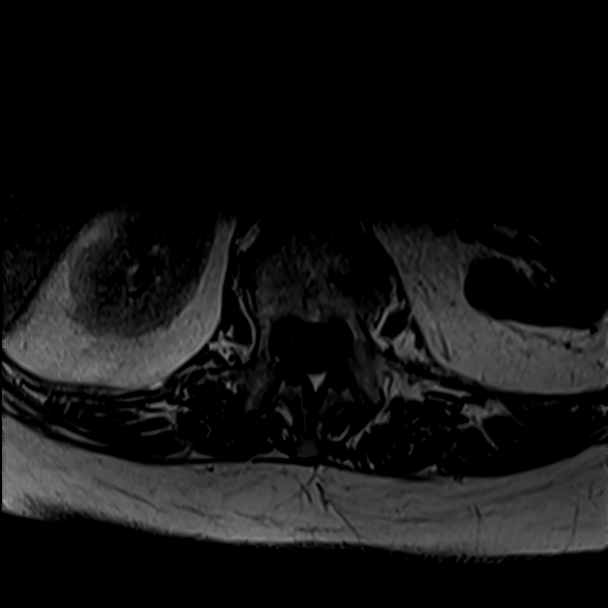
[im 6/41]
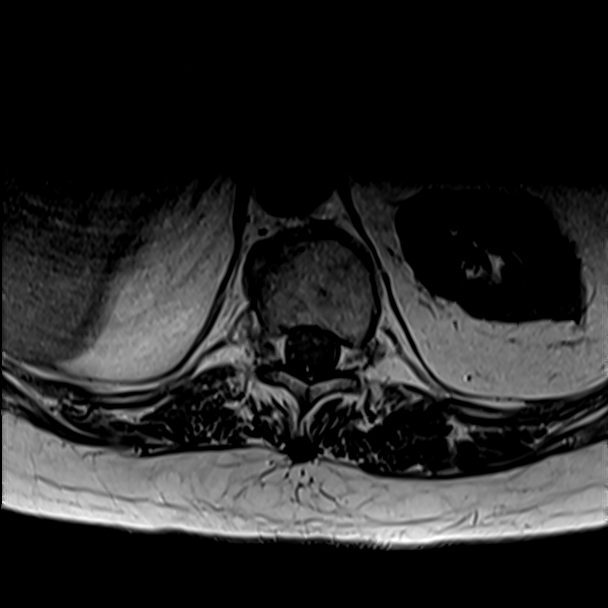
[im 12/41]
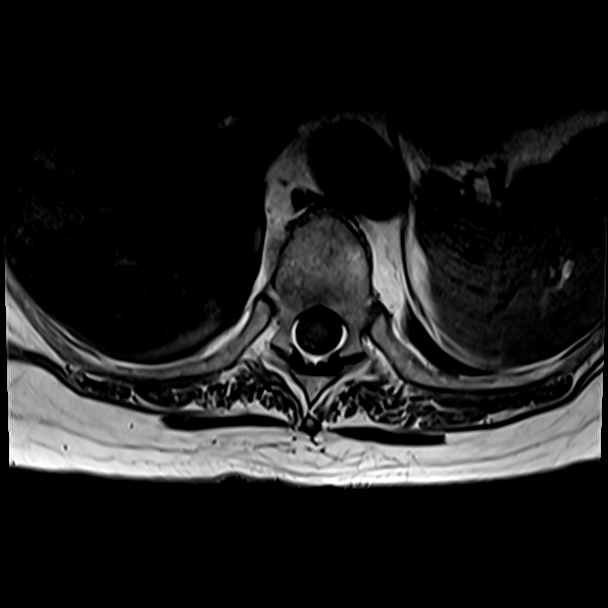
[im 18/41]
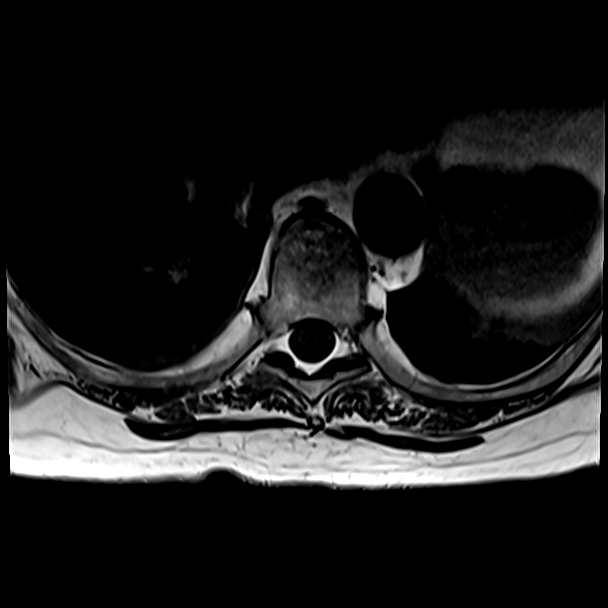
[im 23/41]
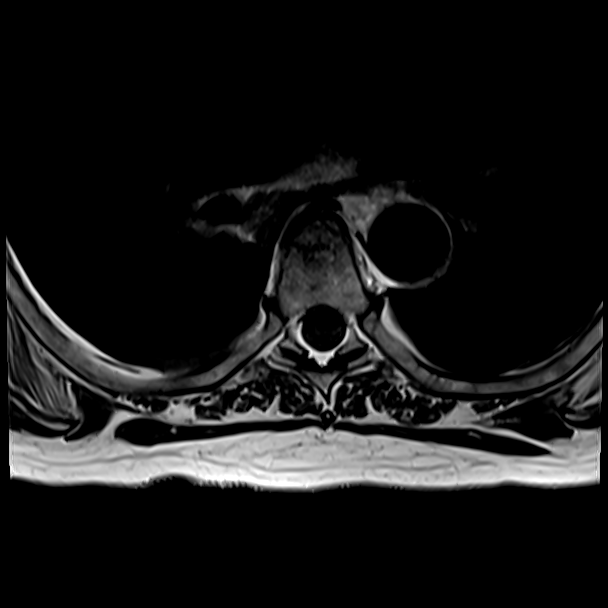
[im 29/41]
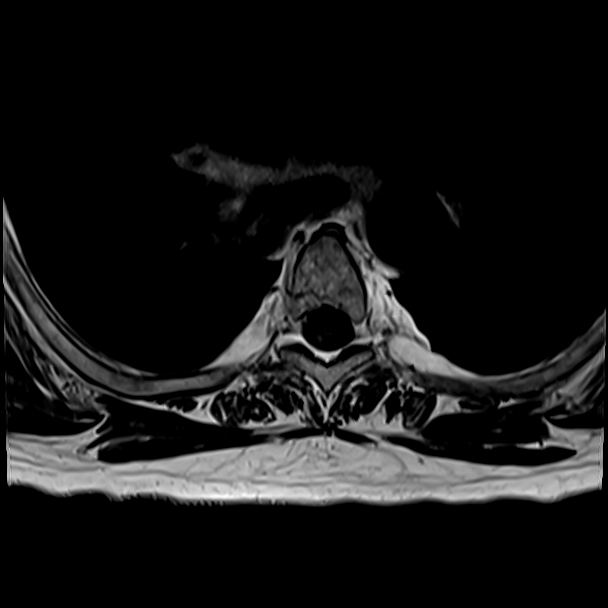
[im 35/41]
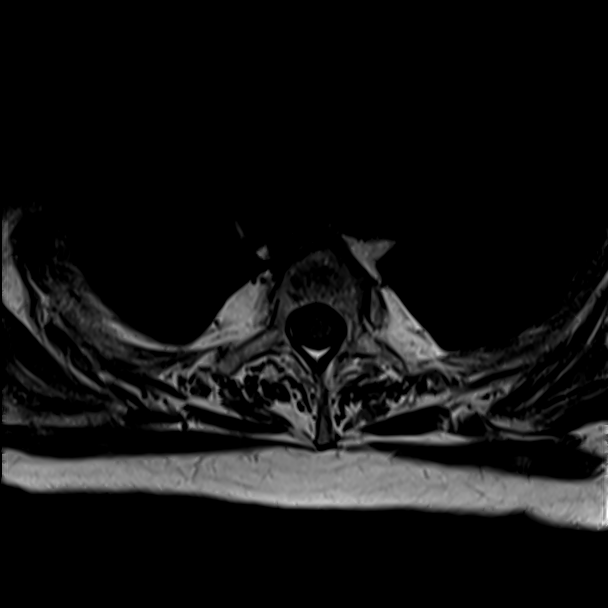
[im 41/41]
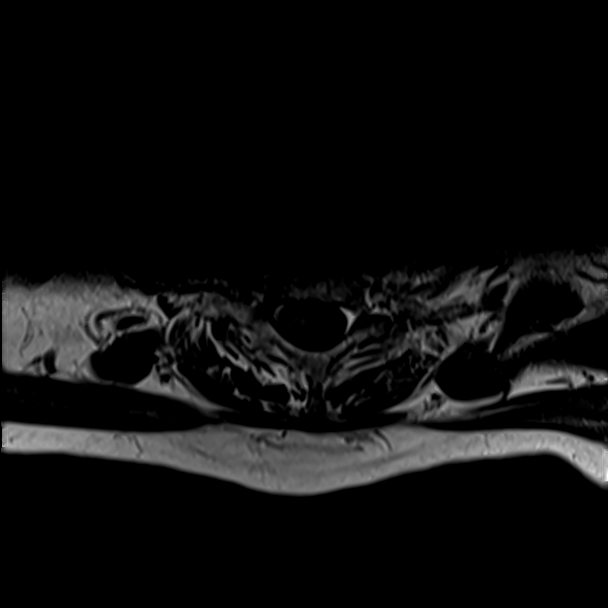

[Series 24: T1 fat-sat post-contrast · sagittal · 3.0mm · 1.06mm/px · 2 of 19 slices shown]
[im 1/19]
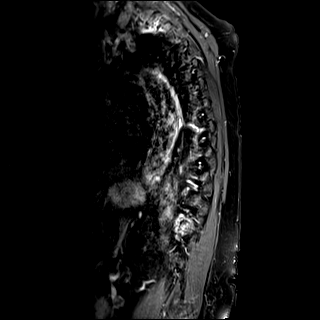
[im 7/19]
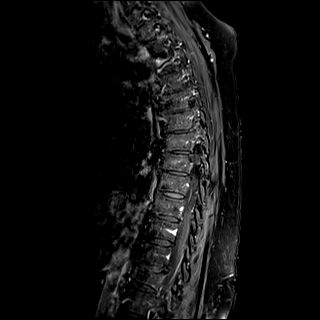

[26 of 48 positions shown; findings below may reference images not displayed]

FINDINGS: Limited cervical spine imaging: Stable. Lower cervical chronic disc
and endplate degeneration.

Thoracic spine segmentation: Appears to be normal, same numbering
system used last month.

Alignment: Stable thoracic kyphosis. No thoracic spondylolisthesis.

Vertebrae: Normal background bone marrow signal. Benign T8 vertebral
body hemangioma. Chronic L1 superior endplate compression fracture.
There is faint degenerative marrow edema at the left T11
costovertebral junction (series 19, image 19), stable. Associated
postcontrast enhancement there (series 24, image 18), also appears
to be degenerative in nature. No regional soft tissue inflammation.
Posterior left 11th rib appears to remain intact. No other marrow
edema or acute osseous abnormality.

Cord: Small circumscribed only 1-2 mm area of central increased
spinal cord signal centered at T7-T8 is redemonstrated and stable on
the precontrast images (series 20, image 24). No associated cord
edema or expansion. Following contrast, no associated enhancement.

Normal spinal cord signal and morphology elsewhere. No abnormal
intradural enhancement. No dural thickening. Conus medullaris
appears normal at T12-L1.

Paraspinal and other soft tissues: Stable appearance of abnormal
signal at the left lung base, likely scarring and/or atelectasis
when compared to the radiographs last year. Otherwise negative
visible chest and upper abdominal viscera.

Disc levels:

Unchanged from last month, mild for age thoracic spine degeneration.
No thoracic spinal stenosis above the T12-L1 level where disc
degeneration and disc bulging is superimposed on the chronic L1
compression fracture.
IMPRESSION: 1. Stable solitary small focus of central spinal cord signal
abnormality centered at T7-T8. No enhancement, and no associated
cord edema or expansion. Favor this is physiologic prominence of the
central spinal canal (normal variant) rather than a small spinal
cord syrinx.

2. Evidence of acute on chronic degeneration of the left 11th
costovertebral junction. No other acute osseous abnormality. Chronic
L1 compression fracture. Stable mild for age thoracic spine
degeneration.

## 2021-08-09 MED ORDER — GADOBUTROL 1 MMOL/ML IV SOLN
7.5000 mL | Freq: Once | INTRAVENOUS | Status: AC | PRN
  Administered 2021-08-09: 7.5 mL via INTRAVENOUS

## 2022-05-14 ENCOUNTER — Encounter: Payer: Self-pay | Admitting: Emergency Medicine

## 2022-05-14 ENCOUNTER — Ambulatory Visit
Admission: EM | Admit: 2022-05-14 | Discharge: 2022-05-14 | Disposition: A | Discharge status: Home / Self Care | Payer: Medicare Other | Attending: Family Medicine | Admitting: Family Medicine

## 2022-05-14 DIAGNOSIS — Z79899 Other long term (current) drug therapy: Secondary | ICD-10-CM | POA: Diagnosis not present

## 2022-05-14 DIAGNOSIS — J45909 Unspecified asthma, uncomplicated: Secondary | ICD-10-CM | POA: Insufficient documentation

## 2022-05-14 DIAGNOSIS — R051 Acute cough: Secondary | ICD-10-CM | POA: Diagnosis present

## 2022-05-14 DIAGNOSIS — Z1152 Encounter for screening for COVID-19: Secondary | ICD-10-CM | POA: Diagnosis not present

## 2022-05-14 DIAGNOSIS — J8283 Eosinophilic asthma: Secondary | ICD-10-CM

## 2022-05-14 DIAGNOSIS — Z7952 Long term (current) use of systemic steroids: Secondary | ICD-10-CM | POA: Diagnosis not present

## 2022-05-14 LAB — GROUP A STREP BY PCR: Group A Strep by PCR: NOT DETECTED

## 2022-05-14 LAB — RESP PANEL BY RT-PCR (FLU A&B, COVID) ARPGX2
Influenza A by PCR: NEGATIVE
Influenza B by PCR: NEGATIVE
SARS Coronavirus 2 by RT PCR: NEGATIVE

## 2022-05-14 MED ORDER — PREDNISONE 20 MG PO TABS: 40.0000 mg | ORAL_TABLET | Freq: Every day | ORAL | 0 refills | Status: AC

## 2022-05-14 NOTE — Discharge Instructions (Signed)
Stop by the pharmacy to pick up your prescription.  Follow up with your primary care provider, as needed.  Go to ED for red flag symptoms, including; fevers you cannot reduce with Tylenol/Motrin, severe headaches, vision changes, numbness/weakness in part of the body, lethargy, confusion, intractable vomiting, severe dehydration, chest pain, breathing difficulty, severe persistent abdominal or pelvic pain, signs of severe infection (increased redness, swelling of an area), feeling faint or passing out, dizziness, etc. You should especially go to the ED for sudden acute worsening of condition if you do not elect to go at this time.

## 2022-05-14 NOTE — ED Triage Notes (Signed)
Patient c/o HA and cough that started yesterday.  Patient reports that she woke up with a sore throat this morning.  Patient denies fevers.

## 2022-05-14 NOTE — ED Provider Notes (Incomplete)
[facility]    CSN: [id number] Arrival date & time: 05/14/22  0944      History   Chief Complaint Chief Complaint  Patient presents with   Headache   Cough    HPI Dorothy Callahan is a 71 y.o. female.   HPI   Dorothy Callahan presents for headache and cough that started yesterday and sore throat and chest tightness that started this morning.  She has not had a fever or chills, shortness of breath.  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  No fever or chills.  She has not yet taken a COVID test.  She is traveling soon and wants to be sure she is okay.  Her grandson has been having a cold and was told sinus infection and she has been around him.        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      Home Medications    Prior to Admission medications   Medication Sig Start Date End Date Taking? Authorizing Provider  atorvastatin (LIPITOR) 10 MG tablet Take 10 mg by mouth at bedtime.    Yes [provider]  Benralizumab (FASENRA PEN) 30 MG/ML SOAJ Inject into the skin. 01/29/22  Yes [provider]  escitalopram (LEXAPRO) 10 MG tablet Take 1 tablet by mouth daily. 01/25/22  Yes [provider]  loteprednol (LOTEMAX) 0.5 % ophthalmic suspension Apply to eye. 05/05/22 06/07/22 Yes [provider]  metoprolol succinate (TOPROL-XL) 50 MG  24 hr tablet Take 25 mg by mouth at bedtime.  12/30/17  Yes [provider]  predniSONE (DELTASONE) 20 MG tablet Take 2 tablets (40 mg total) by mouth daily for 5 days. 05/14/22 05/19/22 Yes Aulani Shipton, Ronnette Juniper, DO  Vitamin D, Ergocalciferol, (DRISDOL) 50000 units CAPS capsule Take 1 capsule by mouth once a week. Takes on Sunday 01/26/18  Yes [provider]  albuterol (PROVENTIL HFA;VENTOLIN HFA) 108 (90 Base) MCG/ACT inhaler Inhale into the lungs every 6 (six) hours as needed for wheezing or shortness of breath.    [provider]  alendronate (FOSAMAX) 70 MG tablet Take 70 mg by mouth once a week.    [provider]  cyclobenzaprine (FLEXERIL) 10 MG tablet Take 1 tablet (10 mg total) by mouth 3 (three) times daily as needed. 03/02/18   Sable Feil, PA-C  FLOVENT HFA 220 MCG/ACT inhaler Inhale 1 puff into the lungs 2 (two) times daily. 01/08/18   [provider]  FLUoxetine (PROZAC) 20 MG capsule Take 20 mg by mouth daily. 01/11/18   [provider]  ibuprofen (ADVIL,MOTRIN) 600 MG tablet Take 1 tablet (600 mg total) by mouth every 8 (eight) hours as needed. 03/02/18   Sable Feil, PA-C  LAGEVRIO 200 MG CAPS Take 4 capsules by mouth  2 (two) times daily. 12/30/20   [provider]  Multiple Vitamin (MULTI-VITAMIN) tablet Take 1 tablet by mouth daily.    [provider]  promethazine-dextromethorphan (PROMETHAZINE-DM) 6.25-15 MG/5ML syrup Take 5 mLs by mouth 4 (four) times daily as needed for cough. 01/07/21   Coral Spikes, DO    Family History Family History  Problem Relation Age of Onset   Aortic aneurysm Father    Aortic aneurysm Brother    Breast cancer Daughter 20    Social History Social History   Tobacco Use   Smoking status: Never   Smokeless tobacco: Never  Vaping Use   Vaping Use: Never used  Substance Use Topics   Alcohol use: Never   Drug use: Never     Allergies   Pregabalin, Denosumab, Aspirin,  Topiramate, Tramadol, and Amitriptyline   Review of Systems Review of Systems: negative unless otherwise stated in HPI.      Physical Exam Triage Vital Signs ED Triage Vitals  Enc Vitals Group     BP 05/14/22 1003 (!) 143/58     Pulse Rate 05/14/22 1003 86     Resp --      Temp 05/14/22 1003 98.3 F (36.8 C)     Temp Source 05/14/22 1003 Oral     SpO2 05/14/22 1003 97 %     Weight 05/14/22 0958 175 lb 4.3 oz (79.5 kg)     Height 05/14/22 0958 '5\' 4"'$  (1.626 m)     Head Circumference --      Peak Flow --      Pain Score 05/14/22 0958 6     Pain Loc --      Pain Edu? --      Excl. in [place]? --    No data found.  Updated Vital Signs BP (!) 143/58 (BP Location: Left Arm)   Pulse 86   Temp 98.3 F (36.8 C) (Oral)   Ht '5\' 4"'$  (1.626 m)   Wt 79.5 kg   SpO2 97%   BMI 30.08 kg/m   Visual Acuity Right Eye Distance:   Left Eye Distance:   Bilateral Distance:    Right Eye Near:   Left Eye Near:    Bilateral Near:     Physical Exam GEN:     alert, non-toxic appearing female in no distress     HENT:  mucus membranes moist, oropharyngeal  without lesions or  exudate, no  tonsillar hypertrophy, no nasal discharge, EYES:   pupils equal and reactive, EOMi ,  no scleral injection NECK:  normal ROM RESP:  no increased work of breathing,  clear to auscultation bilaterally CVS:   regular rate  and rhythm Skin:   warm and dry, no rash on visible skin , normal  skin turgor    UC Treatments / Results  Labs (all labs ordered are listed, but only abnormal results are displayed) Labs Reviewed  GROUP A STREP BY PCR  RESP PANEL BY RT-PCR (FLU A&B, COVID) ARPGX2    EKG   Radiology No results found.  Procedures Procedures (including critical care time)  Medications Ordered in UC Medications - No data to display  Initial Impression / Assessment and Plan / UC Course  I have reviewed the triage vital signs and the nursing notes.  Pertinent labs & imaging results that were  available during my care of the patient were reviewed by me and considered in my medical decision making (see chart for details).  Pt is a 71 y.o. female with history of asthma who presents for 2 days of respiratory symptoms. Christean is  afebrile here without recent antipyretics. Satting well on room air. Overall pt is  well appearing, well hydrated, without respiratory distress. Pulmonary exam  is unremarkable.  COVID  and influenza testing obtained and was negative.  Strep PCR negative.  Given her history of asthma we will give her steroid burst for exacerbation.  Discussed symptomatic treatment.  Explained lack of efficacy of antibiotics in viral disease.  Typical duration of symptoms discussed. Return and ED precautions given and patient voiced understanding.  Discussed MDM, treatment plan and plan for follow-up with patient who agrees with plan.     Final Clinical Impressions(s) / UC Diagnoses   Final diagnoses:  Acute cough  Asthma, unspecified asthma severity, unspecified whether complicated, unspecified whether persistent     Discharge Instructions      Stop by the pharmacy to pick up your prescription.  Follow up with your primary care provider, as needed.  Go to ED for red flag symptoms, including; fevers you cannot reduce with Tylenol/Motrin, severe headaches, vision changes, numbness/weakness in part of the body, lethargy, confusion, intractable vomiting, severe dehydration, chest pain, breathing difficulty, severe persistent abdominal or pelvic pain, signs of severe infection (increased redness, swelling of an area), feeling faint or passing out, dizziness, etc. You should especially go to the ED for sudden acute worsening of condition if you do not elect to go at this time.        ED Prescriptions     Medication Sig Dispense Auth. Provider   predniSONE (DELTASONE) 20 MG tablet Take 2 tablets (40 mg total) by mouth daily for 5 days. 10 tablet Lyndee Hensen, DO           [place], [place] 05/15/22 1116

## 2022-05-17 ENCOUNTER — Ambulatory Visit
Admission: EM | Admit: 2022-05-17 | Discharge: 2022-05-17 | Disposition: A | Discharge status: Home / Self Care | Payer: Medicare Other | Attending: Emergency Medicine | Admitting: Emergency Medicine

## 2022-05-17 ENCOUNTER — Ambulatory Visit (INDEPENDENT_AMBULATORY_CARE_PROVIDER_SITE_OTHER): Payer: Medicare Other

## 2022-05-17 DIAGNOSIS — Z1152 Encounter for screening for COVID-19: Secondary | ICD-10-CM | POA: Insufficient documentation

## 2022-05-17 DIAGNOSIS — R509 Fever, unspecified: Secondary | ICD-10-CM | POA: Insufficient documentation

## 2022-05-17 DIAGNOSIS — J189 Pneumonia, unspecified organism: Secondary | ICD-10-CM | POA: Diagnosis not present

## 2022-05-17 DIAGNOSIS — J101 Influenza due to other identified influenza virus with other respiratory manifestations: Secondary | ICD-10-CM

## 2022-05-17 DIAGNOSIS — R059 Cough, unspecified: Secondary | ICD-10-CM | POA: Diagnosis not present

## 2022-05-17 DIAGNOSIS — R0602 Shortness of breath: Secondary | ICD-10-CM | POA: Diagnosis not present

## 2022-05-17 DIAGNOSIS — Z8616 Personal history of COVID-19: Secondary | ICD-10-CM | POA: Diagnosis not present

## 2022-05-17 DIAGNOSIS — J45909 Unspecified asthma, uncomplicated: Secondary | ICD-10-CM | POA: Insufficient documentation

## 2022-05-17 LAB — CBC WITH DIFFERENTIAL/PLATELET
Abs Immature Granulocytes: 0.06 10*3/uL (ref 0.00–0.07)
Basophils Absolute: 0 10*3/uL (ref 0.0–0.1)
Basophils Relative: 0 %
Eosinophils Absolute: 0 10*3/uL (ref 0.0–0.5)
Eosinophils Relative: 0 %
HCT: 38.6 % (ref 36.0–46.0)
Hemoglobin: 12.6 g/dL (ref 12.0–15.0)
Immature Granulocytes: 1 %
Lymphocytes Relative: 6 %
Lymphs Abs: 0.4 10*3/uL — ABNORMAL LOW (ref 0.7–4.0)
MCH: 29.5 pg (ref 26.0–34.0)
MCHC: 32.6 g/dL (ref 30.0–36.0)
MCV: 90.4 fL (ref 80.0–100.0)
Monocytes Absolute: 0.4 10*3/uL (ref 0.1–1.0)
Monocytes Relative: 5 %
Neutro Abs: 7 10*3/uL (ref 1.7–7.7)
Neutrophils Relative %: 88 %
Platelets: 262 10*3/uL (ref 150–400)
RBC: 4.27 MIL/uL (ref 3.87–5.11)
RDW: 13.1 % (ref 11.5–15.5)
WBC: 7.8 10*3/uL (ref 4.0–10.5)
nRBC: 0 % (ref 0.0–0.2)

## 2022-05-17 LAB — RESP PANEL BY RT-PCR (FLU A&B, COVID) ARPGX2
Influenza A by PCR: POSITIVE — AB
Influenza B by PCR: NEGATIVE
SARS Coronavirus 2 by RT PCR: NEGATIVE

## 2022-05-17 MED ORDER — AMOXICILLIN-POT CLAVULANATE 875-125 MG PO TABS: 1.0000 | ORAL_TABLET | Freq: Two times a day (BID) | ORAL | 0 refills | Status: DC

## 2022-05-17 MED ORDER — PREDNISONE 20 MG PO TABS: 40.0000 mg | ORAL_TABLET | Freq: Every day | ORAL | 0 refills | Status: DC

## 2022-05-17 NOTE — ED Triage Notes (Signed)
Patient reports that she is still coughing and fad a fever of 100 this AM. Patient reports that her pulmonologist wants her to bge retested for COVID, Flu and RVS.   Patient was recently here 05/14/2022 treated with predniSONE for 5 days.   On 05/15/2022 Patients pulmonologist sent in doxycycline for a 7 days course.   This Morning patients pulmonologist sent in [place].   Patient reports that she is currently still taking the prednisone and Doxycycline

## 2022-05-17 NOTE — ED Provider Notes (Signed)
[facility]    CSN: [id number] Arrival date & time: 05/17/22  0959      History   Chief Complaint Chief Complaint  Patient presents with   Cough    HPI Dorothy Callahan is a 71 y.o. female.   Patient presents with a persisting and  worsening productive cough, shortness of breath at rest worsened by exertion fever beginning 5 days ago.  Fever was 100.0 this morning.  Coughing causing pounding headaches.  No known sick contacts.  Tolerating clear liquids.  He is currently taking prednisone, doxycycline and was prescribed Tussionex this morning by her pulmonologist.  Pulmonology requesting testing for RSV, COVID and flu.  Was evaluated in urgent care 4 days ago.  History of severe asthma, pneumonia..   ,.   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      Home Medications    Prior to Admission medications   Medication Sig Start Date End Date Taking? Authorizing Provider  albuterol (PROVENTIL HFA;VENTOLIN HFA) 108 (90 Base) MCG/ACT inhaler Inhale into the lungs every 6 (six) hours as needed for wheezing or shortness of breath.    [provider]  alendronate (FOSAMAX) 70 MG tablet Take 70 mg by mouth once a week.    [provider]  atorvastatin (LIPITOR) 10 MG tablet Take 10 mg by mouth at bedtime.     [provider]  Benralizumab (FASENRA  PEN) 30 MG/ML SOAJ Inject into the skin. 01/29/22   [provider]  cyclobenzaprine (FLEXERIL) 10 MG tablet Take 1 tablet (10 mg total) by mouth 3 (three) times daily as needed. 03/02/18   Sable Feil, PA-C  escitalopram (LEXAPRO) 10 MG tablet Take 1 tablet by mouth daily. 01/25/22   [provider]  FLOVENT HFA 220 MCG/ACT inhaler Inhale 1 puff into the lungs 2 (two) times daily. 01/08/18   [provider]  FLUoxetine (PROZAC) 20 MG capsule Take 20 mg by mouth daily. 01/11/18   [provider]  ibuprofen (ADVIL,MOTRIN) 600 MG tablet Take 1 tablet (600 mg total) by mouth every 8 (eight) hours as needed. 03/02/18   Sable Feil, PA-C  LAGEVRIO 200 MG CAPS Take 4 capsules by mouth 2 (two) times daily. 12/30/20   [provider]  loteprednol (LOTEMAX) 0.5 % ophthalmic suspension Apply to eye. 05/05/22 06/07/22  [provider]  metoprolol succinate (TOPROL-XL) 50 MG 24 hr tablet Take 25 mg by mouth at bedtime.  12/30/17   [provider]  Multiple Vitamin (MULTI-VITAMIN) tablet Take 1 tablet by mouth daily.    [provider]  predniSONE (DELTASONE) 20 MG tablet Take 2 tablets (40 mg total) by mouth daily for 5 days. 05/14/22 05/19/22  Lyndee Hensen, DO  promethazine-dextromethorphan (PROMETHAZINE-DM) 6.25-15 MG/5ML syrup Take 5 mLs  by mouth 4 (four) times daily as needed for cough. 01/07/21   Coral Spikes, DO  Vitamin D, Ergocalciferol, (DRISDOL) 50000 units CAPS capsule Take 1 capsule by mouth once a week. Takes on Sunday 01/26/18   [provider]    Family History Family History  Problem Relation Age of Onset   Aortic aneurysm Father    Aortic aneurysm Brother    Breast cancer Daughter 32    Social History Social History   Tobacco Use   Smoking status: Never   Smokeless tobacco: Never  Vaping Use   Vaping Use: Never used  Substance Use Topics   Alcohol use: Never   Drug use: Never     Allergies    Pregabalin, Denosumab, Aspirin, Topiramate, Tramadol, and Amitriptyline   Review of Systems Review of Systems  Respiratory:  Positive for cough.      Physical Exam Triage Vital Signs ED Triage Vitals  Enc Vitals Group     BP 05/17/22 1050 139/68     Pulse Rate 05/17/22 1050 85     Resp --      Temp 05/17/22 1050 99.1 F (37.3 C)     Temp Source 05/17/22 1050 Oral     SpO2 05/17/22 1050 96 %     Weight 05/17/22 1048 175 lb (79.4 kg)     Height 05/17/22 1048 '5\' 3"'$  (1.6 m)     Head Circumference --      Peak Flow --      Pain Score 05/17/22 1048 5     Pain Loc --      Pain Edu? --      Excl. in [place]? --    No data found.  Updated Vital Signs BP 139/68 (BP Location: Left Arm)   Pulse 85   Temp 99.1 F (37.3 C) (Oral)   Ht '5\' 3"'$  (1.6 m)   Wt 175 lb (79.4 kg)   SpO2 96%   BMI 31.00 kg/m   Visual Acuity Right Eye Distance:   Left Eye Distance:   Bilateral Distance:    Right Eye Near:   Left Eye Near:    Bilateral Near:     Physical Exam Constitutional:      Appearance: Normal appearance.  HENT:     Head: Normocephalic.  Eyes:     Extraocular Movements: Extraocular movements intact.  Cardiovascular:     Rate and Rhythm: Normal rate and regular rhythm.     Pulses: Normal pulses.     Heart sounds: Normal heart sounds.  Pulmonary:     Effort: Pulmonary effort is normal.     Breath sounds: Normal breath sounds.     Comments: Harsh Congested cough witnessed Neurological:     Mental Status: She is alert and oriented to person, place, and time. Mental status is at baseline.  Psychiatric:        Mood and Affect: Mood normal.        Behavior: Behavior normal.      UC Treatments / Results  Labs (all labs ordered are listed, but only abnormal results are displayed) Labs Reviewed  RESP PANEL BY RT-PCR (FLU A&B, COVID) ARPGX2    EKG   Radiology No results found.  Procedures Procedures (including critical care time)  Medications Ordered in  UC Medications - No data to display  Initial Impression / Assessment and Plan / UC Course  I have reviewed the triage vital signs and the nursing notes.  Pertinent labs & imaging results  that were available during my care of the patient were reviewed by me and considered in my medical decision making (see chart for details).  Influenza A, CAP  Vital signs are stable and well patient is ill-appearing she is in no signs of distress nor toxic appearing, influenza confirmed by PCR, patient had testing at initial evaluation in urgent care which was negative, at this time we will not start Tamiflu as we do not know the initial date that flu symptoms began, chest x-ray showing possible atelectasis pneumonia, discussed with patient, additional coverage provided, and may in 7-day course prescribed, recommended continued use of doxycycline, prednisone course extended by 5 days and encourage patient to pick up Tussionex which was prescribed by pulmonologist from pharmacy patient given strict precautions at any point if symptoms worsen she is to go to the nearest emergency department for evaluation for IV treatment  Final Clinical Impressions(s) / UC Diagnoses   Final diagnoses:  None   Discharge Instructions   None    ED Prescriptions   None    PDMP not reviewed this encounter.   Hans Eden, NP 05/17/22 1212

## 2022-05-17 NOTE — Discharge Instructions (Addendum)
Influenza A positive, negative for COVID and RSV  Chest x-ray showing possible pneumonia therefore an additional antibiotic has been added, begin Augmentin every morning and every evening for 7 days, continue doxycycline as prescribed  Complete prednisone then complete additional 5 days   Lab work is pending, you will be notified of concerning values  Continue steroid course as prescribed as this helps to reduce inflammation and irritation to your airways  Cough medicine has been sent to the pharmacy by your pulmonologist, please be mindful this medication will make you drowsy  Your symptoms today are most likely being caused by a virus and should steadily improve in time it can take up to 7 to 10 days before you truly start to see a turnaround however things will get better    You can take Tylenol and/or Ibuprofen as needed for fever reduction and pain relief.   For cough: honey 1/2 to 1 teaspoon (you can dilute the honey in water or another fluid).  You can also use guaifenesin and dextromethorphan for cough. You can use a humidifier for chest congestion and cough.  If you don't have a humidifier, you can sit in the bathroom with the hot shower running.      For sore throat: try warm salt water gargles, cepacol lozenges, throat spray, warm tea or water with lemon/honey, popsicles or ice, or OTC cold relief medicine for throat discomfort.   For congestion: take a daily anti-histamine like Zyrtec, Claritin, and a oral decongestant, such as pseudoephedrine.  You can also use Flonase 1-2 sprays in each nostril daily.   It is important to stay hydrated: drink plenty of fluids (water, gatorade/powerade/pedialyte, juices, or teas) to keep your throat moisturized and help further relieve irritation/discomfort.  At any point if your symptoms worsen please go to the nearest emergency department for evaluation for IV medications

## 2022-07-08 ENCOUNTER — Encounter: Payer: Self-pay | Admitting: Emergency Medicine

## 2022-07-08 ENCOUNTER — Ambulatory Visit
Admission: EM | Admit: 2022-07-08 | Discharge: 2022-07-08 | Disposition: A | Discharge status: Home / Self Care | Payer: Medicare Other | Attending: Family Medicine | Admitting: Family Medicine

## 2022-07-08 DIAGNOSIS — N3000 Acute cystitis without hematuria: Secondary | ICD-10-CM

## 2022-07-08 LAB — URINALYSIS, MICROSCOPIC (REFLEX)

## 2022-07-08 LAB — URINALYSIS, ROUTINE W REFLEX MICROSCOPIC
Bilirubin Urine: NEGATIVE
Glucose, UA: NEGATIVE mg/dL
Ketones, ur: NEGATIVE mg/dL
Nitrite: NEGATIVE
Protein, ur: NEGATIVE mg/dL
Specific Gravity, Urine: <1.005 — ABNORMAL LOW (ref 1.005–1.030)
pH: 6 (ref 5.0–8.0)

## 2022-07-08 MED ORDER — CEPHALEXIN 500 MG PO CAPS: 500.0000 mg | ORAL_CAPSULE | Freq: Four times a day (QID) | ORAL | 0 refills | Status: DC

## 2022-07-08 NOTE — ED Triage Notes (Signed)
Patient c/o burning when urinating and urinary frequency that started last night.  Patient also reports some bladder pressure.

## 2022-07-08 NOTE — ED Provider Notes (Incomplete)
[facility]    CSN: [id number] Arrival date & time: 07/08/22  1219      History   Chief Complaint Chief Complaint  Patient presents with  . Urinary Frequency  . Dysuria     HPI HPI Dorothy Callahan is a 71 y.o. female.    Dorothy Callahan presents for *** .  Tried *** prior to arrival.  Has *** not had any antibiotics in last 30 days.   Denies known STI exposure.   Reports no ***symptoms in her partner. Dorothy Callahan does ***not use condoms regularly. She is *** not currently pregnant.    - Contraception: *** - Missed period: *** No LMP recorded. Patient is postmenopausal. - Abnormal vaginal discharge: *** - vaginal bleeding: *** - Dysuria: *** - Hematuria: *** - Urinary urgency: *** - Urinary frequency: ***  - Fever: *** - Abdominal pain *** - Pelvic pain: *** - Rash/Skin lesions/mouth ulcers: *** - Nausea: no *** - Vomiting: no *** - Back Pain: no *** - CVA tenderness  - Headache: no       Past Medical History:  Diagnosis Date  . Anxiety   . Arthritis   . Asthma   . 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 COVID-19 12/30/2020  . Depression   . 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 Elevated troponin 03/02/2018  . Anxiety 03/02/2018  . Depression 03/02/2018  . GERD (gastroesophageal reflux disease) 03/02/2018  . HLD (hyperlipidemia) 03/02/2018  . 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 ENDOMETRIAL ABLATION    . TONSILLECTOMY      OB History   No obstetric history on file.      Home Medications    Prior to Admission medications   Medication Sig Start Date End Date Taking? Authorizing Provider  atorvastatin (LIPITOR) 10 MG tablet Take 10 mg by mouth at bedtime.    Yes [provider]  cephALEXin (KEFLEX) 500 MG capsule Take 1 capsule (500 mg total) by mouth 4  (four) times daily. 07/08/22  Yes Brenner Visconti, DO  escitalopram (LEXAPRO) 10 MG tablet Take 1 tablet by mouth daily. 01/25/22  Yes [provider]  metoprolol succinate (TOPROL-XL) 50 MG 24 hr tablet Take 25 mg by mouth at bedtime.  12/30/17  Yes [provider]  Multiple Vitamin (MULTI-VITAMIN) tablet Take 1 tablet by mouth daily.   Yes [provider]  Vitamin D, Ergocalciferol, (DRISDOL) 50000 units CAPS capsule Take 1 capsule by mouth once a week. Takes on Sunday 01/26/18  Yes [provider]  albuterol (PROVENTIL HFA;VENTOLIN HFA) 108 (90 Base) MCG/ACT inhaler Inhale into the lungs every 6 (six) hours as needed for wheezing or shortness of breath.    [provider]  alendronate (FOSAMAX) 70 MG tablet Take 70 mg by mouth once a week.    [provider]  Benralizumab (FASENRA PEN) 30 MG/ML SOAJ Inject into the skin. 01/29/22   [provider]  cyclobenzaprine (FLEXERIL) 10 MG tablet Take 1 tablet (10 mg total) by mouth 3 (three) times daily as needed. 03/02/18   Sable Feil, PA-C  FLOVENT HFA 220 MCG/ACT inhaler Inhale 1 puff into the lungs 2 (two) times daily. 01/08/18   [provider]  FLUoxetine (PROZAC) 20 MG capsule Take 20 mg by mouth daily. 01/11/18  [provider]  ibuprofen (ADVIL,MOTRIN) 600 MG tablet Take 1 tablet (600 mg total) by mouth every 8 (eight) hours as needed. 03/02/18   Sable Feil, PA-C  LAGEVRIO 200 MG CAPS Take 4 capsules by mouth 2 (two) times daily. 12/30/20   [provider]    Family History Family History  Problem Relation Age of Onset  . Aortic aneurysm Father   . Aortic aneurysm Brother   . Breast cancer Daughter 46    Social History Social History   Tobacco Use  . Smoking status: Never  . Smokeless tobacco: Never  Vaping Use  . Vaping Use: Never used  Substance Use Topics  . Alcohol use: Never  . Drug use: Never     Allergies   Pregabalin, Denosumab,  Aspirin, Topiramate, Tramadol, and Amitriptyline   Review of Systems Review of Systems: :negative unless otherwise stated in HPI.      Physical Exam Triage Vital Signs ED Triage Vitals  Enc Vitals Group     BP 07/08/22 1247 131/63     Pulse Rate 07/08/22 1247 79     Resp 07/08/22 1247 14     Temp 07/08/22 1247 98.2 F (36.8 C)     Temp Source 07/08/22 1247 Oral     SpO2 07/08/22 1247 98 %     Weight 07/08/22 1244 175 lb 0.7 oz (79.4 kg)     Height 07/08/22 1244 '5\' 3"'$  (1.6 m)     Head Circumference --      Peak Flow --      Pain Score 07/08/22 1244 3     Pain Loc --      Pain Edu? --      Excl. in [employer]? --    No data found.  Updated Vital Signs BP 131/63 (BP Location: Left Arm)   Pulse 79   Temp 98.2 F (36.8 C) (Oral)   Resp 14   Ht '5\' 3"'$  (1.6 m)   Wt 79.4 kg   SpO2 98%   BMI 31.01 kg/m   Visual Acuity Right Eye Distance:   Left Eye Distance:   Bilateral Distance:    Right Eye Near:   Left Eye Near:    Bilateral Near:     Physical Exam GEN: well appearing female in no acute distress  CVS: well perfused  RESP: speaking in full sentences without pause  ABD: soft, non-tender, non-distended, no palpable masses ***  GU: deferred, patient performed self swab  ***Pelvic exam: normal external genitalia, vulva, VAGINA and CERVIX: {details:315904::normal appearing cervix without discharge or lesions}, ADNEXA: normal adnexa in size, nontender and no masses, WET MOUNT done - results: {:[id number]}, exam chaperoned by CMA.    UC Treatments / Results  Labs (all labs ordered are listed, but only abnormal results are displayed) Labs Reviewed  URINALYSIS, ROUTINE W REFLEX MICROSCOPIC - Abnormal; Notable for the following components:      Result Value   Specific Gravity, Urine <1.005 (*)    Hgb urine dipstick SMALL (*)    Leukocytes,Ua TRACE (*)    All other components within normal limits  URINALYSIS, MICROSCOPIC (REFLEX) - Abnormal; Notable for the following  components:   Bacteria, UA FEW (*)    All other components within normal limits  URINE CULTURE    EKG   Radiology No results found.  Procedures Procedures (including critical care time)  Medications Ordered in UC Medications - No data to display  Initial Impression / Assessment and Plan / UC  Course  I have reviewed the triage vital signs and the nursing notes.  Pertinent labs & imaging results that were available during my care of the patient were reviewed by me and considered in my medical decision making (see chart for details).     *** Patient is a 71 y.o.Marland Kitchen female  who presents for **.  Overall patient is well-appearing and afebrile.  Vital signs stable.  UA ***consistent with acute cystitis.   Hematuria ***supported on microscopy.  Treat with ***Keflex 4 times daily for 5 days.  ***Yeast vaginitis and bacterial vaginitis ***confirmed on wet prep.  Gonorrhea and Chlamydia testing obtained.   - Treatment: Keflex as above. Flagyl 500 BID x 7 days and advised patient to not drink alcohol while taking this medication.  - ***Diflucan for 2 doses for antibiotic associated yeast infection  - ***Abstain from coitus during course of treatment.  - *** G/C Chlamydia and call in Rx if positive.    Return precautions including abdominal pain, fever, chills, nausea, or vomiting given. Discussed MDM, treatment plan and plan for follow-up with patient/parent who agrees with plan.    STI Screening  GC and chlamydia DNA  probe sent to lab. HIV and RPR ***collected. ***Negative Trichomoniasis on wet prep.  - Treatment: ***500 mg IM CTX, po doxycycline 100 mg BID x7 days - ***Flagyl 500 BID x 7 days and . Advised patient to not drink alcohol while taking Flagyl.  - Abstain from coitus during course of treatment - Advised patient to use barrier protection/condoms.  - F/U if symptoms not improving or getting worse.  - Self care instructions given including avoiding douching. Handout given.  -  F/U with PCP as needed.  - Return precautions including abdominal pain, fever, chills, nausea, or vomiting given.    ***Acute cystitis:  Patient is a 72 y.o. female  who presents for ***days of ***dysuria and ***urinary urgency.  Overall patient is well-appearing and afebrile.  Vital signs stable.  UA consistent with ***pyuria.  Hematuria not ***supported on microscopy.  Patient has *** UTI in the past year. Treat with ***Keflex 4 times daily for 5 days.  ***Urine culture obtained.  Follow-up sensitivities and change antibiotics, if needed.   -Follow-up if symptoms not improving or getting worse - ***Handout given.  - Return precautions including abdominal pain, fever, chills, nausea, or vomiting given.   Discussed MDM, treatment plan and plan for follow-up with patient/parent who agrees with plan.        Final Clinical Impressions(s) / UC Diagnoses   Final diagnoses:  Acute cystitis without hematuria     Discharge Instructions      You have symptoms of a urinary tract infection. Stop by the pharmacy to pick up your prescriptions.For your UTI: Take Keflex 4 times a day for the next 5 days. I sent your urine for culture. If you need to stop or change your antibiotics, someone will contact you.    If your symptoms do not improve in the next 7 days, be sure to follow-up here or at your primary care provider office.  Go to the emergency department if you are having increasing pain, worsening vaginal bleeding or fever.     ED Prescriptions     Medication Sig Dispense Auth. Provider   cephALEXin (KEFLEX) 500 MG capsule Take 1 capsule (500 mg total) by mouth 4 (four) times daily. 20 capsule Lyndee Hensen, DO      PDMP not reviewed this encounter.

## 2022-07-08 NOTE — Discharge Instructions (Addendum)
You have symptoms of a urinary tract infection. Stop by the pharmacy to pick up your prescriptions.For your UTI: Take Keflex 4 times a day for the next 5 days. I sent your urine for culture. If you need to stop or change your antibiotics, someone will contact you.    If your symptoms do not improve in the next 7 days, be sure to follow-up here or at your primary care provider office.  Go to the emergency department if you are having increasing pain, worsening vaginal bleeding or fever.

## 2022-07-09 LAB — URINE CULTURE: Culture: <10000 — AB

## 2022-09-18 ENCOUNTER — Encounter (INDEPENDENT_AMBULATORY_CARE_PROVIDER_SITE_OTHER): Payer: Self-pay

## 2022-10-15 ENCOUNTER — Encounter: Payer: Self-pay | Admitting: Emergency Medicine

## 2022-10-15 ENCOUNTER — Ambulatory Visit
Admission: EM | Admit: 2022-10-15 | Discharge: 2022-10-15 | Disposition: A | Discharge status: Home / Self Care | Payer: Medicare HMO | Attending: Family Medicine | Admitting: Family Medicine

## 2022-10-15 ENCOUNTER — Ambulatory Visit (INDEPENDENT_AMBULATORY_CARE_PROVIDER_SITE_OTHER): Payer: Medicare HMO

## 2022-10-15 DIAGNOSIS — B349 Viral infection, unspecified: Secondary | ICD-10-CM

## 2022-10-15 DIAGNOSIS — R059 Cough, unspecified: Secondary | ICD-10-CM

## 2022-10-15 DIAGNOSIS — J8283 Eosinophilic asthma: Secondary | ICD-10-CM | POA: Diagnosis not present

## 2022-10-15 DIAGNOSIS — J45901 Unspecified asthma with (acute) exacerbation: Secondary | ICD-10-CM

## 2022-10-15 MED ORDER — PREDNISONE 50 MG PO TABS: 50.0000 mg | ORAL_TABLET | Freq: Every day | ORAL | 0 refills | Status: AC

## 2022-10-15 NOTE — ED Triage Notes (Signed)
Patient c/o cough and chest congestion that started on Friday.  Patient was seen at his PCP on Friday and told had URI.  Patient states that her cough has gotten worse.  Patient has history of asthma.

## 2022-10-15 NOTE — ED Provider Notes (Signed)
[facility]    CSN: [id number] Arrival date & time: 10/15/22  1200      History   Chief Complaint Chief Complaint  Patient presents with   Cough    HPI Allissa Albright is a 72 y.o. female.   HPI   Mariangela presents for cough since Thursday. Went to PCP who told her it was likely just a cold. States she gets bronchitis and pneumonia easily. She follows with [facility] pulmonology.  Has eosinophilic asthma. Has some mild shortness of breath. She has been using her inhalers. Has dark yellow pheglm.     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      Home Medications    Prior to Admission medications   Medication Sig Start Date End Date Taking? Authorizing Provider  albuterol (PROVENTIL HFA;VENTOLIN HFA) 108 (90 Base) MCG/ACT inhaler Inhale into the lungs every 6 (six) hours as needed for wheezing or shortness of breath.    [provider]  alendronate (FOSAMAX) 70 MG tablet Take 70 mg by mouth once a week.    [provider]  atorvastatin (LIPITOR) 10 MG tablet Take 10 mg by mouth at bedtime.     [provider]  Benralizumab (FASENRA PEN) 30 MG/ML SOAJ Inject into the skin. 01/29/22   [provider]  cyclobenzaprine (FLEXERIL) 10 MG tablet Take 1 tablet (10 mg total) by mouth 3 (three) times daily as needed. 03/02/18   Joni Reining, PA-C  escitalopram  (LEXAPRO) 10 MG tablet Take 1 tablet by mouth daily. 01/25/22   [provider]  FLOVENT HFA 220 MCG/ACT inhaler Inhale 1 puff into the lungs 2 (two) times daily. 01/08/18   [provider]  FLUoxetine (PROZAC) 20 MG capsule Take 20 mg by mouth daily. 01/11/18   [provider]  ibuprofen (ADVIL,MOTRIN) 600 MG tablet Take 1 tablet (600 mg total) by mouth every 8 (eight) hours as needed. 03/02/18   Joni Reining, PA-C  LAGEVRIO 200 MG CAPS Take 4 capsules by mouth 2 (two) times daily. 12/30/20   [provider]  metoprolol succinate (TOPROL-XL) 50 MG 24 hr tablet Take 25 mg by mouth at bedtime.  12/30/17   [provider]  Multiple Vitamin (MULTI-VITAMIN) tablet Take 1 tablet by mouth daily.    [provider]  Vitamin D, Ergocalciferol, (DRISDOL) 50000 units CAPS capsule Take 1 capsule by mouth once a week. Takes on Sunday 01/26/18   [provider]    Family History Family History  Problem Relation Age of Onset   Aortic aneurysm Father    Aortic aneurysm Brother    Breast cancer Daughter 25    Social History Social History   Tobacco Use   Smoking status: Never   Smokeless tobacco: Never  Vaping Use   Vaping  Use: Never used  Substance Use Topics   Alcohol use: Never   Drug use: Never     Allergies   Pregabalin, Denosumab, Aspirin, Quinolones, Topiramate, Tramadol, and Amitriptyline   Review of Systems Review of Systems: negative unless otherwise stated in HPI.      Physical Exam Triage Vital Signs ED Triage Vitals  Enc Vitals Group     BP 10/15/22 1315 (!) 111/59     Pulse Rate 10/15/22 1315 81     Resp 10/15/22 1315 15     Temp 10/15/22 1315 98.7 F (37.1 C)     Temp Source 10/15/22 1315 Oral     SpO2 10/15/22 1315 96 %     Weight 10/15/22 1313 175 lb 0.7 oz (79.4 kg)     Height 10/15/22 1313 5\' 3"  (1.6 m)     Head Circumference --      Peak Flow --      Pain Score 10/15/22 1313 0     Pain Loc --       Pain Edu? --      Excl. in [place]? --    No data found.  Updated Vital Signs BP (!) 111/59 (BP Location: Right Arm)   Pulse 81   Temp 98.7 F (37.1 C) (Oral)   Resp 15   Ht 5\' 3"  (1.6 m)   Wt 79.4 kg   SpO2 96%   BMI 31.01 kg/m   Visual Acuity Right Eye Distance:   Left Eye Distance:   Bilateral Distance:    Right Eye Near:   Left Eye Near:    Bilateral Near:     Physical Exam GEN:     alert, non-toxic appearing elderly female in no distress    HENT:  mucus membranes moist, oropharyngeal without lesions or exudate, no tonsillar hypertrophy, clear nasal discharge EYES:   pupils equal and reactive, no scleral injection or discharge NECK:  normal ROM  RESP:  no increased work of breathing, clear to auscultation bilaterally CVS:   regular rate and rhythm Skin:   warm and dry, no rash on visible skin    UC Treatments / Results  Labs (all labs ordered are listed, but only abnormal results are displayed) Labs Reviewed - No data to display  EKG   Radiology DG Chest 2 View  Result Date: 10/15/2022 CLINICAL DATA:  cough EXAM: CHEST - 2 VIEW COMPARISON:  May 17, 2022, January 07, 2021 FINDINGS: The cardiomediastinal silhouette is unchanged in contour.Unchanged elevation of the LEFT hemidiaphragm. Unchanged blunting of the LEFT costophrenic angle. No pneumothorax. Similar appearance of a LEFT basilar platelike opacity of the LEFT lower lobe most consistent with atelectasis/scar. No acute pleuroparenchymal abnormality. Visualized abdomen is unremarkable. Similar wedging of a vertebral body at the thoracolumbar junction. IMPRESSION: Similar appearance of LEFT basilar atelectasis/scar. No acute cardiopulmonary abnormality. Electronically Signed   By: Meda KlinefelterStephanie  Peacock M.D.   On: 10/15/2022 14:20     Procedures Procedures (including critical care time)  Medications Ordered in UC Medications - No data to display  Initial Impression / Assessment and Plan / UC Course  I have  reviewed the triage vital signs and the nursing notes.  Pertinent labs & imaging results that were available during my care of the patient were reviewed by me and considered in my medical decision making (see chart for details).       Pt is a 72 y.o. female with history of asthma who presents for 3-4 days of respiratory symptoms. Harriett SineNancy  is afebrile here. Satting well on room air. Overall pt is non-toxic appearing, well hydrated, without respiratory distress. Pulmonary exam is unremarkable.  On chart review, [facility] and was [facility].  Patient with history of eosinophilic asthma.  Chest xray personally reviewed by me without focal pneumonia, pleural effusion, cardiomegaly or pneumothorax.radiologist report reviewed and notes left basilar atelectasis versus scar that is unchanged from previous.  I suspect patient may be having an acute asthma flare related to her acute viral illness.  Treat with prednisone burst.  Discussed symptomatic treatment.  Explained lack of efficacy of antibiotics in viral disease.  Typical duration of symptoms discussed.   Return and ED precautions given and voiced understanding. Discussed MDM, treatment plan and plan for follow-up with patient who agrees with plan.     Final Clinical Impressions(s) / UC Diagnoses   Final diagnoses:  Eosinophilic asthma     Discharge Instructions      Stop by the pharmacy to pick up your prescriptions.  Follow up with your pulmonologist if symptoms do not improve       ED Prescriptions     Medication Sig Dispense Auth. Provider   predniSONE (DELTASONE) 50 MG tablet Take 1 tablet (50 mg total) by mouth daily for 5 days. 5 tablet Katha CabalBrimage, Haset [place], DO      PDMP not reviewed this encounter.   Katha CabalBrimage, Nesanel Aguila, DO 10/24/22 1229

## 2022-10-15 NOTE — Discharge Instructions (Signed)
Stop by the pharmacy to pick up your prescriptions.  Follow up with your pulmonologist if symptoms do not improve

## 2022-11-30 ENCOUNTER — Other Ambulatory Visit: Payer: Self-pay | Admitting: Physical Medicine & Rehabilitation

## 2022-11-30 DIAGNOSIS — M5416 Radiculopathy, lumbar region: Secondary | ICD-10-CM

## 2022-12-19 ENCOUNTER — Ambulatory Visit
Admission: RE | Admit: 2022-12-19 | Discharge: 2022-12-19 | Disposition: A | Discharge status: Home / Self Care | Payer: Medicare HMO | Source: Ambulatory Visit | Attending: Physical Medicine & Rehabilitation | Admitting: Physical Medicine & Rehabilitation

## 2022-12-19 DIAGNOSIS — M5416 Radiculopathy, lumbar region: Secondary | ICD-10-CM

## 2023-01-12 ENCOUNTER — Ambulatory Visit (INDEPENDENT_AMBULATORY_CARE_PROVIDER_SITE_OTHER): Payer: Medicare HMO

## 2023-01-12 ENCOUNTER — Ambulatory Visit
Admission: EM | Admit: 2023-01-12 | Discharge: 2023-01-12 | Disposition: A | Discharge status: Home / Self Care | Payer: Medicare HMO | Source: Home / Self Care

## 2023-01-12 DIAGNOSIS — R051 Acute cough: Secondary | ICD-10-CM | POA: Diagnosis present

## 2023-01-12 DIAGNOSIS — U071 COVID-19: Secondary | ICD-10-CM | POA: Diagnosis present

## 2023-01-12 DIAGNOSIS — R509 Fever, unspecified: Secondary | ICD-10-CM | POA: Insufficient documentation

## 2023-01-12 DIAGNOSIS — J45901 Unspecified asthma with (acute) exacerbation: Secondary | ICD-10-CM | POA: Insufficient documentation

## 2023-01-12 LAB — SARS CORONAVIRUS 2 BY RT PCR: SARS Coronavirus 2 by RT PCR: POSITIVE — AB

## 2023-01-12 MED ORDER — NIRMATRELVIR/RITONAVIR (PAXLOVID)TABLET: 3.0000 | ORAL_TABLET | Freq: Two times a day (BID) | ORAL | 0 refills | Status: AC

## 2023-01-12 MED ORDER — IPRATROPIUM-ALBUTEROL 0.5-2.5 (3) MG/3ML IN SOLN
3.0000 mL | Freq: Once | RESPIRATORY_TRACT | Status: AC
  Administered 2023-01-12: 3 mL via RESPIRATORY_TRACT

## 2023-01-12 MED ORDER — HYDROCOD POLI-CHLORPHE POLI ER 10-8 MG/5ML PO SUER: 5.0000 mL | Freq: Two times a day (BID) | ORAL | 0 refills | Status: DC | PRN

## 2023-01-12 MED ORDER — PREDNISONE 20 MG PO TABS: 40.0000 mg | ORAL_TABLET | Freq: Every day | ORAL | 0 refills | Status: AC

## 2023-01-12 NOTE — ED Triage Notes (Signed)
Pt presents with fever at home this morning 101, with headache, congestion, SOB, cough, body aches, chills, fatigue, that started 2 days ago. Taking ibuprofen.

## 2023-01-12 NOTE — ED Provider Notes (Signed)
[facility]    CSN: [id number] Arrival date & time: 01/12/23  1017      History   Chief Complaint Chief Complaint  Patient presents with   Generalized Body Aches    101.0 temp this am.  Cough. - Entered by patient   Nasal Congestion   Cough    HPI Dorothy Callahan is a 72 y.o. female with history of asthma.  Patient presents today for onset of fever up to 101 degrees with body aches, headaches, fatigue, chills, cough, congestion, chest tightness and shortness of breath.  Patient does report some of her symptoms started a couple days ago.  Reports pain worse today.  Denies any sick contacts but states that she was in and out of [facility] this week with her husband.  She has been taking ibuprofen.  She has not taken any cough medication.  She reports using albuterol at home but says her chest feels tight and she feels short of breath still.  Denies any severe shortness of breath.  She has been vaccinated for COVID-19.  No other complaints.  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      Home Medications    Prior to Admission medications   Medication Sig Start Date End Date Taking? Authorizing Provider  albuterol (PROVENTIL HFA;VENTOLIN HFA) 108 (90 Base) MCG/ACT inhaler Inhale into the lungs every 6 (six) hours as needed for wheezing or shortness of breath.    Yes [provider]  alendronate (FOSAMAX) 70 MG tablet Take 70 mg by mouth once a week.   Yes [provider]  atorvastatin (LIPITOR) 10 MG tablet Take 10 mg by mouth at bedtime.    Yes [provider]  Benralizumab (FASENRA PEN) 30 MG/ML SOAJ Inject into the skin. 01/29/22  Yes [provider]  chlorpheniramine-HYDROcodone (TUSSIONEX) 10-8 MG/5ML Take 5 mLs by mouth every 12 (twelve) hours as needed for cough. 01/12/23  Yes Shirlee Latch, PA-C  FLOVENT HFA 220 MCG/ACT inhaler Inhale 1 puff into the lungs 2 (two) times daily. 01/08/18  Yes [provider]  FLUoxetine (PROZAC) 20 MG capsule Take 20 mg by mouth daily. 01/11/18  Yes [provider]  ibuprofen (ADVIL,MOTRIN) 600 MG tablet Take 1 tablet (600 mg total) by mouth every 8 (eight) hours as needed. 03/02/18  Yes Nona Dell K, PA-C  LAGEVRIO 200 MG CAPS Take 4 capsules by mouth 2 (two) times daily. 12/30/20  Yes [provider]  metoprolol succinate (TOPROL-XL) 50 MG 24 hr tablet Take 25 mg by mouth at bedtime.  12/30/17  Yes [provider]  Multiple Vitamin (MULTI-VITAMIN) tablet Take 1 tablet by mouth daily.   Yes [provider]  nirmatrelvir/ritonavir (PAXLOVID) 20 x 150 MG & 10 x 100MG  TABS Take  3 tablets by mouth 2 (two) times daily for 5 days. Patient GFR is 94 Take nirmatrelvir (150 mg) two tablets twice daily for 5 days and ritonavir (100 mg) one tablet twice daily for 5 days. 01/12/23 01/17/23 Yes Shirlee Latch, PA-C  predniSONE (DELTASONE) 20 MG tablet Take 2 tablets (40 mg total) by mouth daily for 5 days. 01/12/23 01/17/23 Yes Shirlee Latch, PA-C  Vitamin D, Ergocalciferol, (DRISDOL) 50000 units CAPS capsule Take 1 capsule by mouth once a week. Takes on Sunday 01/26/18  Yes [provider]  cyclobenzaprine (FLEXERIL) 10 MG tablet Take 1 tablet (10 mg total) by mouth 3 (three) times daily as needed. 03/02/18   Joni Reining, PA-C  escitalopram  (LEXAPRO) 10 MG tablet Take 1 tablet by mouth daily. 01/25/22   [provider]    Family History Family History  Problem Relation Age of Onset   Aortic aneurysm Father    Aortic aneurysm Brother    Breast cancer Daughter 67    Social History Social History   Tobacco Use   Smoking status: Never   Smokeless tobacco: Never  Vaping Use   Vaping Use: Never used  Substance Use Topics   Alcohol use: Never   Drug use: Never     Allergies   Pregabalin, Denosumab, Aspirin, Quinolones, Topiramate, Tramadol, and Amitriptyline   Review of Systems Review of Systems  Constitutional:  Positive for chills, fatigue and fever. Negative for diaphoresis.  HENT:  Positive for congestion and rhinorrhea. Negative for ear pain, sinus pressure, sinus pain and sore throat.   Respiratory:  Positive for cough, chest tightness, shortness of breath and wheezing.   Cardiovascular:  Negative for chest pain.  Gastrointestinal:  Negative for abdominal pain, nausea and vomiting.  Musculoskeletal:  Positive for myalgias.  Skin:  Negative for rash.  Neurological:  Positive for headaches. Negative for weakness.  Hematological:  Negative for adenopathy.     Physical Exam Triage Vital Signs ED Triage Vitals  Enc Vitals Group     BP 01/12/23 1037 (!) 122/54     Pulse Rate 01/12/23 1037 83     Resp 01/12/23 1037 20     Temp 01/12/23 1037 99.4 F (37.4 C)     Temp Source 01/12/23 1037 Oral     SpO2 01/12/23 1037 93 %     Weight --      Height --      Head Circumference --      Peak Flow --      Pain Score 01/12/23 1038 5     Pain Loc --      Pain Edu? --      Excl. in GC? --    No data found.  Updated Vital Signs BP (!) 122/54 (BP Location: Right Arm)   Pulse 83   Temp 99.4 F (37.4 C) (Oral)   Resp 20   SpO2 97%   Physical Exam Vitals and nursing note reviewed.  Constitutional:      General: She is not in acute distress.    Appearance: Normal appearance. She is  ill-appearing. She is not toxic-appearing.  HENT:     Head: Normocephalic and atraumatic.     Nose: Congestion present.     Mouth/Throat:     Mouth: Mucous membranes are moist.     Pharynx: Oropharynx is clear.  Eyes:     General: No scleral icterus.       Right eye: No discharge.  Left eye: No discharge.     Conjunctiva/sclera: Conjunctivae normal.  Cardiovascular:     Rate and Rhythm: Normal rate and regular rhythm.     Heart sounds: Normal heart sounds.  Pulmonary:     Effort: Pulmonary effort is normal. No respiratory distress.     Breath sounds: Wheezing present.  Musculoskeletal:     Cervical back: Neck supple.  Skin:    General: Skin is dry.  Neurological:     General: No focal deficit present.     Mental Status: She is alert. Mental status is at baseline.     Motor: No weakness.     Gait: Gait normal.  Psychiatric:        Mood and Affect: Mood normal.        Behavior: Behavior normal.        Thought Content: Thought content normal.      UC Treatments / Results  Labs (all labs ordered are listed, but only abnormal results are displayed) Labs Reviewed  SARS CORONAVIRUS 2 BY RT PCR - Abnormal; Notable for the following components:      Result Value   SARS Coronavirus 2 by RT PCR POSITIVE (*)    All other components within normal limits    EKG   Radiology DG Chest 2 View  Result Date: 01/12/2023 CLINICAL DATA:  fever, cough, chest tigtness this morning EXAM: CHEST - 2 VIEW COMPARISON:  10/15/2022 FINDINGS: Persistent elevation of left diaphragmatic leaflet with some adjacent atelectasis, infiltrate or scar at the left lung base. No new infiltrate or overt edema. Right lung clear. Heart size and mediastinal contours are within normal limits. No effusion. Stable vertebral compression deformity near the thoracolumbar junction. IMPRESSION: No acute findings. Persistent elevation of left diaphragmatic leaflet with adjacent atelectasis, infiltrate or scar.  Electronically Signed   By: Corlis Leak M.D.   On: 01/12/2023 11:52    Procedures Procedures (including critical care time)  Medications Ordered in UC Medications  ipratropium-albuterol (DUONEB) 0.5-2.5 (3) MG/3ML nebulizer solution 3 mL (3 mLs Nebulization Given 01/12/23 1104)    Initial Impression / Assessment and Plan / UC Course  I have reviewed the triage vital signs and the nursing notes.  Pertinent labs & imaging results that were available during my care of the patient were reviewed by me and considered in my medical decision making (see chart for details).   72 year old female with history of asthma presents for fever, fatigue, body aches, cough, congestion, chest tightness and shortness of breath.   Vitals are normal and stable.  Oxygen 93%.  COVID testing obtained. Positive.   Chest x-ray obtained to assess for possible pneumonia. No change from x-ray obtained in April.  Patient given DuoNeb treatment. She reports this was helpful and oxygen saturation increased to 97%.  Last GFR this year was 94. Will start patient on Paxlovid.  Advised patient to hold Lipitor while on Paxlovid and for 5 days after.  Encouraged her to increase rest and fluids.  Patient requesting cough medication, specifically Tussionex as needed for cough at bedtime.  Sent this medication after reviewing controlled substance database.  Also sent prednisone.  Patient requesting azithromycin.  Discussed with her this medication is not indicated in acute COVID infections and there is no evidence of pneumonia on her x-ray.  We reviewed current CDC guidelines, isolation protocol and ED precautions.  1 acute illness with systemic symptoms and exacerbation of chronic underlying condition.   Final Clinical Impressions(s) / UC Diagnoses  Final diagnoses:  COVID-19  Exacerbation of asthma, unspecified asthma severity, unspecified whether persistent  Acute cough  Fever, unspecified     Discharge Instructions       -COVID is positive today.  You need to isolate until you are fever free for 24 hours and symptoms are improving.  As long as you are coughing wear a mask. - Use your albuterol at home as needed for shortness of breath.  I also sent prednisone and cough medication to the pharmacy for you. - Start Paxlovid soon as possible. Do not take Lipitor for the time you are on Paxlovid and for 5 days after. -Go to the ER if you have uncontrollable fever, weakness or shortness of breath not relieved by use of your inhaler and the prednisone.     ED Prescriptions     Medication Sig Dispense Auth. Provider   chlorpheniramine-HYDROcodone (TUSSIONEX) 10-8 MG/5ML Take 5 mLs by mouth every 12 (twelve) hours as needed for cough. 70 mL Eusebio Friendly B, PA-C   predniSONE (DELTASONE) 20 MG tablet Take 2 tablets (40 mg total) by mouth daily for 5 days. 10 tablet Shirlee Latch, PA-C   nirmatrelvir/ritonavir (PAXLOVID) 20 x 150 MG & 10 x 100MG  TABS Take 3 tablets by mouth 2 (two) times daily for 5 days. Patient GFR is 94 Take nirmatrelvir (150 mg) two tablets twice daily for 5 days and ritonavir (100 mg) one tablet twice daily for 5 days. 30 tablet Shirlee Latch, PA-C      I have reviewed the PDMP during this encounter.   Shirlee Latch, PA-C 01/12/23 1158

## 2023-01-12 NOTE — Discharge Instructions (Addendum)
-  COVID is positive today.  You need to isolate until you are fever free for 24 hours and symptoms are improving.  As long as you are coughing wear a mask. - Use your albuterol at home as needed for shortness of breath.  I also sent prednisone and cough medication to the pharmacy for you. - Start Paxlovid soon as possible. Do not take Lipitor for the time you are on Paxlovid and for 5 days after. -Go to the ER if you have uncontrollable fever, weakness or shortness of breath not relieved by use of your inhaler and the prednisone.

## 2023-01-23 ENCOUNTER — Ambulatory Visit
Admission: EM | Admit: 2023-01-23 | Discharge: 2023-01-23 | Disposition: A | Discharge status: Home / Self Care | Payer: Medicare HMO

## 2023-01-23 DIAGNOSIS — S81831A Puncture wound without foreign body, right lower leg, initial encounter: Secondary | ICD-10-CM

## 2023-01-23 DIAGNOSIS — Z23 Encounter for immunization: Secondary | ICD-10-CM

## 2023-01-23 MED ORDER — TETANUS-DIPHTH-ACELL PERTUSSIS 5-2.5-18.5 LF-MCG/0.5 IM SUSY
0.5000 mL | PREFILLED_SYRINGE | Freq: Once | INTRAMUSCULAR | Status: AC
  Administered 2023-01-23: 0.5 mL via INTRAMUSCULAR
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
## 2023-01-23 NOTE — ED Triage Notes (Signed)
Pt c/o laceration in R leg that occurred today. States she was cleaning in her garage & was picking up old window & part of inside trim went inside leg. Last Tdap 10/15/14.

## 2023-04-24 ENCOUNTER — Ambulatory Visit (INDEPENDENT_AMBULATORY_CARE_PROVIDER_SITE_OTHER): Payer: Medicare HMO

## 2023-04-24 ENCOUNTER — Ambulatory Visit
Admission: EM | Admit: 2023-04-24 | Discharge: 2023-04-24 | Disposition: A | Discharge status: Home / Self Care | Payer: Medicare HMO | Attending: Internal Medicine | Admitting: Internal Medicine

## 2023-04-24 ENCOUNTER — Encounter: Payer: Self-pay | Admitting: Emergency Medicine

## 2023-04-24 DIAGNOSIS — R051 Acute cough: Secondary | ICD-10-CM | POA: Diagnosis not present

## 2023-04-24 DIAGNOSIS — J4541 Moderate persistent asthma with (acute) exacerbation: Secondary | ICD-10-CM | POA: Diagnosis not present

## 2023-04-24 DIAGNOSIS — J40 Bronchitis, not specified as acute or chronic: Secondary | ICD-10-CM

## 2023-04-24 MED ORDER — PREDNISONE 10 MG PO TABS: ORAL_TABLET | ORAL | 0 refills | Status: AC

## 2023-04-24 MED ORDER — PROMETHAZINE-DM 6.25-15 MG/5ML PO SYRP
2.5000 mL | ORAL_SOLUTION | Freq: Three times a day (TID) | ORAL | 0 refills | Status: DC | PRN
Start: 2023-04-24 — End: 2023-07-05

## 2023-04-24 NOTE — ED Provider Notes (Signed)
[facility]    CSN: [id number] Arrival date & time: 04/24/23  1624      History   Chief Complaint Chief Complaint  Patient presents with   Cough   Shortness of Breath   Headache    HPI Dorothy Callahan is a 72 y.o. female  presents for evaluation of URI symptoms for 2 weeks. Patient reports associated symptoms of reductive cough with shortness of breath and chills. Denies N/V/D, documented fevers, sore throat, ear pain, body aches. Patient does have a hx of asthma.  She is an albuterol inhaler as well as steroid inhaler which she has been using.  Patient reports at the beginning of her symptoms she contacted her PCP for treatment as she was going out of town.  She was prescribed prednisone taper, doxycycline for 7 days and Tessalon.  Patient states she completed those just a few days ago but continues to have the cough.  Reports she is "prone to pneumonia".  Pt has taken nothing OTC for symptoms. Pt has no other concerns at this time.    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      Home Medications    Prior to Admission medications   Medication Sig Start Date End Date Taking? Authorizing Provider   predniSONE (DELTASONE) 10 MG tablet Take 4 tablets (40 mg total) by mouth daily with breakfast for 3 days, THEN 3 tablets (30 mg total) daily with breakfast for 3 days, THEN 2 tablets (20 mg total) daily with breakfast for 3 days, THEN 1 tablet (10 mg total) daily with breakfast for 3 days. 04/24/23 05/06/23 Yes Radford Pax, NP  promethazine-dextromethorphan (PROMETHAZINE-DM) 6.25-15 MG/5ML syrup Take 2.5 mLs by mouth 3 (three) times daily as needed for cough. 04/24/23  Yes Radford Pax, NP  albuterol (PROVENTIL HFA;VENTOLIN HFA) 108 (90 Base) MCG/ACT inhaler Inhale into the lungs every 6 (six) hours as needed for wheezing or shortness of breath.    [provider]  alendronate (FOSAMAX) 70 MG tablet Take 70 mg by mouth once a week.    [provider]  atorvastatin (LIPITOR) 10 MG tablet Take 10 mg by mouth at bedtime.     [provider]  Benralizumab (FASENRA PEN) 30 MG/ML SOAJ Inject into the skin. 01/29/22   [provider]  cyclobenzaprine (FLEXERIL) 10 MG tablet Take 1 tablet (10 mg total) by mouth 3 (three) times daily as needed. 03/02/18   Joni Reining, PA-C  escitalopram (LEXAPRO) 10 MG tablet Take 1 tablet by mouth daily. 01/25/22   [provider]  FLOVENT HFA 220 MCG/ACT inhaler Inhale 1  puff into the lungs 2 (two) times daily. 01/08/18   [provider]  FLUoxetine (PROZAC) 20 MG capsule Take 20 mg by mouth daily. 01/11/18   [provider]  Fluticasone-Salmeterol 232-14 MCG/ACT AEPB Inhale 1 puff into the lungs 2 (two) times daily.    [provider]  ibuprofen (ADVIL,MOTRIN) 600 MG tablet Take 1 tablet (600 mg total) by mouth every 8 (eight) hours as needed. 03/02/18   Joni Reining, PA-C  LAGEVRIO 200 MG CAPS Take 4 capsules by mouth 2 (two) times daily. 12/30/20   [provider]  metoprolol succinate (TOPROL-XL) 50 MG 24 hr tablet Take 25 mg by mouth at bedtime.  12/30/17   [provider]   Multiple Vitamin (MULTI-VITAMIN) tablet Take 1 tablet by mouth daily.    [provider]  Vitamin D, Ergocalciferol, (DRISDOL) 50000 units CAPS capsule Take 1 capsule by mouth once a week. Takes on Sunday 01/26/18   [provider]    Family History Family History  Problem Relation Age of Onset   Aortic aneurysm Father    Aortic aneurysm Brother    Breast cancer Daughter 59    Social History Social History   Tobacco Use   Smoking status: Never   Smokeless tobacco: Never  Vaping Use   Vaping status: Never Used  Substance Use Topics   Alcohol use: Never   Drug use: Never     Allergies   Pregabalin, Denosumab, Aspirin, Quinolones, Topiramate, Tramadol, and Amitriptyline   Review of Systems Review of Systems  Respiratory:  Positive for cough and shortness of breath.   Neurological:  Positive for headaches.     Physical Exam Triage Vital Signs ED Triage Vitals  Encounter Vitals Group     BP 04/24/23 1638 113/61     Systolic BP Percentile --      Diastolic BP Percentile --      Pulse Rate 04/24/23 1638 74     Resp 04/24/23 1638 18     Temp 04/24/23 1638 98.5 F (36.9 C)     Temp Source 04/24/23 1638 Oral     SpO2 04/24/23 1638 99 %     Weight --      Height --      Head Circumference --      Peak Flow --      Pain Score 04/24/23 1637 3     Pain Loc --      Pain Education --      Exclude from Growth Chart --    No data found.  Updated Vital Signs BP 113/61 (BP Location: Left Arm)   Pulse 74   Temp 98.5 F (36.9 C) (Oral)   Resp 18   SpO2 99%   Visual Acuity Right Eye Distance:   Left Eye Distance:   Bilateral Distance:    Right Eye Near:   Left Eye Near:    Bilateral Near:     Physical Exam Vitals and nursing note reviewed.  Constitutional:      General: She is not in acute distress.    Appearance: She is well-developed. She is not ill-appearing.  HENT:     Head: Normocephalic and atraumatic.     Right Ear: Tympanic  membrane and ear canal normal.     Left Ear: Tympanic membrane and ear canal normal.     Nose: Congestion present.     Mouth/Throat:     Mouth: Mucous membranes are moist.     Pharynx: Oropharynx  is clear. Uvula midline. No oropharyngeal exudate or posterior oropharyngeal erythema.     Tonsils: No tonsillar exudate or tonsillar abscesses.  Eyes:     Conjunctiva/sclera: Conjunctivae normal.     Pupils: Pupils are equal, round, and reactive to light.  Cardiovascular:     Rate and Rhythm: Normal rate and regular rhythm.     Heart sounds: Normal heart sounds.  Pulmonary:     Effort: Pulmonary effort is normal.     Breath sounds: Normal breath sounds. No wheezing.  Musculoskeletal:     Cervical back: Normal range of motion and neck supple.  Lymphadenopathy:     Cervical: No cervical adenopathy.  Skin:    General: Skin is warm and dry.  Neurological:     General: No focal deficit present.     Mental Status: She is alert and oriented to person, place, and time.  Psychiatric:        Mood and Affect: Mood normal.        Behavior: Behavior normal.      UC Treatments / Results  Labs (all labs ordered are listed, but only abnormal results are displayed) Labs Reviewed - No data to display  EKG   Radiology DG Chest 2 View  Result Date: 04/24/2023 CLINICAL DATA:  Cough for 2 weeks. EXAM: CHEST - 2 VIEW COMPARISON:  Chest radiographs 01/12/2023 and 10/15/2022 FINDINGS: Cardiac silhouette and mediastinal contours are unchanged and within normal limits. There is again moderate elevation of the left hemidiaphragm. Left basilar horizontal linear subsegmental atelectasis versus scarring is unchanged from multiple prior radiographs. Increased lucencies within the bilateral lungs suggesting chronic emphysematous changes. No definite pleural effusion or pneumothorax. Mild-to-moderate multilevel degenerative disc changes of the thoracic spine. Unchanged moderate height loss of the L1 vertebral  body. IMPRESSION: 1. No acute cardiopulmonary process. 2. Chronic emphysematous changes. Electronically Signed   By: Neita Garnet M.D.   On: 04/24/2023 17:32    Procedures Procedures (including critical care time)  Medications Ordered in UC Medications - No data to display  Initial Impression / Assessment and Plan / UC Course  I have reviewed the triage vital signs and the nursing notes.  Pertinent labs & imaging results that were available during my care of the patient were reviewed by me and considered in my medical decision making (see chart for details).     Reviewed exam and symptoms with patient.  Chest x-ray negative for pneumonia.  Discussed bronchitis/postviral cough syndrome.  Do not feel additional antibiotics are indicated.  Will try Promethazine DM as needed for cough.  Side effect profile reviewed.  Patient requested additional prednisone taper as this has been the most beneficial to her.  Advised to follow-up with her PCP or pulmonologist in 2 to 3 days for recheck.  Strict ER precautions reviewed and patient verbalized understanding. Final Clinical Impressions(s) / UC Diagnoses   Final diagnoses:  Acute cough  Bronchitis  Moderate persistent asthma with acute exacerbation     Discharge Instructions      You may take Promethazine DM as needed for cough.  Please note this medication can make you drowsy.  Do not drink alcohol or drive on this medication.  Start prednisone taper as prescribed.  Continue your inhalers as needed.  Please follow-up with your PCP or pulmonologist in 2 to 3 days for recheck.  Please go to the ER for any worsening symptoms.  I hope you feel better soon!     ED Prescriptions  Medication Sig Dispense Auth. Provider   predniSONE (DELTASONE) 10 MG tablet Take 4 tablets (40 mg total) by mouth daily with breakfast for 3 days, THEN 3 tablets (30 mg total) daily with breakfast for 3 days, THEN 2 tablets (20 mg total) daily with breakfast for 3  days, THEN 1 tablet (10 mg total) daily with breakfast for 3 days. 30 tablet Radford Pax, NP   promethazine-dextromethorphan (PROMETHAZINE-DM) 6.25-15 MG/5ML syrup Take 2.5 mLs by mouth 3 (three) times daily as needed for cough. 118 mL Radford Pax, NP      PDMP not reviewed this encounter.   Radford Pax, NP 04/24/23 1745

## 2023-04-24 NOTE — Discharge Instructions (Signed)
You may take Promethazine DM as needed for cough.  Please note this medication can make you drowsy.  Do not drink alcohol or drive on this medication.  Start prednisone taper as prescribed.  Continue your inhalers as needed.  Please follow-up with your PCP or pulmonologist in 2 to 3 days for recheck.  Please go to the ER for any worsening symptoms.  I hope you feel better soon!

## 2023-04-24 NOTE — ED Triage Notes (Signed)
Pt presents with a productive cough, SOB and headache x 1 week.

## 2023-07-05 ENCOUNTER — Ambulatory Visit (INDEPENDENT_AMBULATORY_CARE_PROVIDER_SITE_OTHER): Payer: Medicare HMO

## 2023-07-05 ENCOUNTER — Ambulatory Visit
Admission: EM | Admit: 2023-07-05 | Discharge: 2023-07-05 | Disposition: A | Discharge status: Home / Self Care | Payer: Medicare HMO | Attending: Emergency Medicine | Admitting: Emergency Medicine

## 2023-07-05 DIAGNOSIS — Z20822 Contact with and (suspected) exposure to covid-19: Secondary | ICD-10-CM | POA: Diagnosis present

## 2023-07-05 DIAGNOSIS — J45901 Unspecified asthma with (acute) exacerbation: Secondary | ICD-10-CM

## 2023-07-05 DIAGNOSIS — R051 Acute cough: Secondary | ICD-10-CM

## 2023-07-05 DIAGNOSIS — J22 Unspecified acute lower respiratory infection: Secondary | ICD-10-CM | POA: Diagnosis present

## 2023-07-05 LAB — RESP PANEL BY RT-PCR (FLU A&B, COVID) ARPGX2
Influenza A by PCR: NEGATIVE
Influenza B by PCR: NEGATIVE
SARS Coronavirus 2 by RT PCR: NEGATIVE

## 2023-07-05 MED ORDER — ACETAMINOPHEN 325 MG PO TABS
650.0000 mg | ORAL_TABLET | Freq: Once | ORAL | Status: AC
  Administered 2023-07-05: 650 mg via ORAL

## 2023-07-05 MED ORDER — AMOXICILLIN-POT CLAVULANATE 875-125 MG PO TABS: 1.0000 | ORAL_TABLET | Freq: Two times a day (BID) | ORAL | 0 refills | Status: AC

## 2023-07-05 MED ORDER — AZITHROMYCIN 250 MG PO TABS: 250.0000 mg | ORAL_TABLET | Freq: Every day | ORAL | 0 refills | Status: DC

## 2023-07-05 MED ORDER — PREDNISONE 20 MG PO TABS: 40.0000 mg | ORAL_TABLET | Freq: Every day | ORAL | 0 refills | Status: AC

## 2023-07-05 MED ORDER — HYDROCOD POLI-CHLORPHE POLI ER 10-8 MG/5ML PO SUER
5.0000 mL | Freq: Two times a day (BID) | ORAL | 0 refills | Status: AC | PRN
Start: 2023-07-05 — End: ?

## 2023-07-05 NOTE — ED Provider Notes (Signed)
HPI  SUBJECTIVE:  Dorothy Callahan is a 72 y.o. female who presents with 4 days of feeling feverish with bodyaches, headaches, nasal congestion, clear yellow rhinorrhea, mild sore throat, postnasal drip, cough productive of yellow sputum, worsening dyspnea on exertion, wheezing and fatigue.  No sinus pain or pressure, wheezing, chest pain, shortness of breath at rest, nausea, vomiting, diarrhea, abdominal pain.  She is unable to sleep at night because of the cough.  No known COVID or flu exposure.  She got 2 doses of the COVID-vaccine and this years flu vaccine.  No antibiotics in the past 3 months.  She took Tylenol within 6 hours of evaluation.  She has also been resting and applying heat with improvement in her symptoms.  She is using her albuterol inhaler with a spacer for wheezing twice daily.  She normally uses it as needed. Patient has a past medical history of asthma, descending aortic aneurysm status post repair in 2016, hyperlipidemia, obstructive sleep apnea.  No admissions for her asthma.  No recent steroid use.  No history of chronic kidney disease, hypercholesterolemia, hypertension.  She takes metoprolol and Lipitor as prophylaxis for the thoracic aortic aneurysm.  PCP: [facility].  Pulmonology: [facility].  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  Current Outpatient Medications:    alendronate (FOSAMAX) 70 MG tablet, Take 70 mg by mouth once a week., Disp: , Rfl:    amoxicillin-clavulanate (AUGMENTIN) 875-125 MG tablet, Take 1 tablet by mouth every 12 (twelve) hours for 5 days., Disp: 10 tablet, Rfl: 0   atorvastatin (LIPITOR) 10 MG tablet, Take 10 mg by mouth at bedtime. , Disp: , Rfl:    azithromycin (ZITHROMAX) 250 MG tablet, Take 1 tablet (250 mg total) by mouth daily. 2 tabs po on day 1, 1 tab po on days 2-5, Disp: 6 tablet, Rfl: 0   Benralizumab (FASENRA PEN) 30 MG/ML SOAJ, Inject into the skin., Disp: , Rfl:    chlorpheniramine-HYDROcodone (TUSSIONEX) 10-8 MG/5ML, Take 5 mLs by mouth every 12 (twelve) hours as needed for cough., Disp: 60 mL, Rfl: 0   FLUoxetine (PROZAC) 20 MG capsule, Take 20 mg by mouth daily., Disp: , Rfl:    Fluticasone-Salmeterol 232-14 MCG/ACT AEPB, Inhale 1 puff into the lungs 2 (two) times daily., Disp: , Rfl:    metoprolol succinate (TOPROL-XL) 50 MG 24 hr tablet, Take 25 mg by mouth at bedtime. , Disp: , Rfl:    predniSONE (DELTASONE) 20 MG tablet, Take 2 tablets (40 mg total) by mouth daily with breakfast for 5 days., Disp: 10 tablet, Rfl: 0   escitalopram (LEXAPRO) 10 MG tablet, Take 1 tablet by mouth  daily., Disp: , Rfl:    FLOVENT HFA 220 MCG/ACT inhaler, Inhale 1 puff into the lungs 2 (two) times daily., Disp: , Rfl:    ibuprofen (ADVIL,MOTRIN) 600 MG tablet, Take 1 tablet (600 mg total) by mouth every 8 (eight) hours as needed., Disp: 15 tablet, Rfl: 0   LAGEVRIO 200 MG CAPS, Take 4 capsules by mouth 2 (two) times daily., Disp: , Rfl:    Multiple Vitamin (MULTI-VITAMIN) tablet, Take 1 tablet by mouth daily., Disp: , Rfl:    Vitamin D, Ergocalciferol, (DRISDOL) 50000 units CAPS capsule, Take 1 capsule by mouth once a week. Takes on Sunday, Disp: , Rfl: 10  Allergies  Allergen Reactions   Pregabalin Other (See Comments)    Hallucinations and falling    Denosumab  Other (See Comments)    Reaction: body aches   Aspirin Other (See Comments)    Reaction: "makes me feel like I have the flu"  Flu-like Sx  Flu like symptoms   Quinolones Other (See Comments)    Fluroquinolone antibiotics should be avoided in patients with history of aortic aneurysm/dissection  Fluroquinolone antibiotics should be avoided in patients with history of aortic dissection/aneurysm.  Fluroquinolone antibiotics should be avoided in patients with history of aortic aneurysm/dissection    Fluroquinolone antibiotics should be avoided in patients with history of aortic aneurysm/dissection  Fluroquinolone antibiotics should be avoided in patients with history of aortic dissection/aneurysm.   Topiramate Other (See Comments)    Reaction: Slurred speach    Tramadol Other (See Comments)    Reaction: "loopy"   Amitriptyline Other (See Comments)    Other reaction(s): Unknown      ROS  As noted in HPI.   Physical Exam  BP 122/67 (BP Location: Right Arm)   Pulse 77   Temp (!) 100.4 F (38 C) (Oral)   Resp (!) 24   SpO2 95%   Constitutional: Well developed, well nourished, no acute distress.  Deep cough. Eyes:  EOMI, conjunctiva normal bilaterally HENT: Normocephalic, atraumatic,mucus membranes moist.  Clear nasal congestion.  Erythematous, swollen turbinates.  No maxillary, frontal sinus tenderness.  No postnasal drip. Respiratory: Normal inspiratory effort.  Rhonchi upper lungs bilaterally.  Positive anterior chest wall tenderness. Cardiovascular: Normal rate regular rhythm, no murmurs rubs gallops GI: nondistended skin: No rash, skin intact Musculoskeletal: no deformities Neurologic: Alert & oriented x 3, no focal neuro deficits Psychiatric: Speech and behavior appropriate   ED Course   Medications  acetaminophen (TYLENOL) tablet 650 mg (650 mg Oral Given 07/05/23 1011)    Orders Placed This Encounter  Procedures   Resp Panel by RT-PCR (Flu A&B, Covid)  Anterior Nasal Swab    Standing Status:   Standing    Number of Occurrences:   1    Patient immune status:   Normal    Release to patient:   Immediate [1]   DG Chest 2 View    Standing Status:   Standing    Number of Occurrences:   1    Reason for Exam (SYMPTOM  OR DIAGNOSIS REQUIRED):   Productive cough, fever 5 days rule out pneumonia    Results for orders placed or performed during the hospital encounter of 07/05/23 (from the past 24 hours)  Resp Panel by RT-PCR (Flu A&B, Covid) Anterior Nasal Swab     Status: None   Collection Time: 07/05/23 10:08 AM   Specimen: Anterior Nasal Swab  Result Value Ref Range   SARS Coronavirus 2 by RT PCR NEGATIVE NEGATIVE  Influenza A by PCR NEGATIVE NEGATIVE   Influenza B by PCR NEGATIVE NEGATIVE   DG Chest 2 View Result Date: 07/05/2023 CLINICAL DATA:  Productive cough, fever. EXAM: CHEST - 2 VIEW COMPARISON:  April 24, 2023. FINDINGS: The heart size and mediastinal contours are within normal limits. Right lung is clear. Elevated left hemidiaphragm is noted. Stable left basilar atelectasis or scarring is noted. The visualized skeletal structures are unremarkable. IMPRESSION: Stable elevated left hemidiaphragm with stable left basilar subsegmental atelectasis or scarring. Electronically Signed   By: Lupita Raider M.D.   On: 07/05/2023 10:50    ED Clinical Impression  1. Lower respiratory tract infection   2. Acute cough   3. Encounter for laboratory testing for COVID-19 virus   4. Moderate asthma with acute exacerbation, unspecified whether persistent      ED Assessment/Plan    Patient presents with an acute illness with systemic symptoms of fever and tachypnea.  She was given Tylenol while here in department.  Patient is in no respiratory distress, speaking in full sentences.  Will check COVID, flu and a chest x-ray due to focal lung findings.  [place] [employer]arcotic database reviewed for this patient, and feel that the risk/benefit ratio today  is favorable for proceeding with a prescription for controlled substance.  Last opiate prescription was in October for cough syrup.  COVID, influenza negative.  Reviewed imaging independently.  Stable elevated left hemidiaphragm with stable left basilar subsegmental atelectasis or scarring.  See radiology report for full details.  While x-ray is negative for any acute cardiopulmonary process, she does have a deep cough.  Home with Tussionex, Mucinex, regularly scheduled albuterol inhaler with a spacer for 4 days, then as needed thereafter, Tylenol combined with ibuprofen 3 times a day, saline nasal irrigation, prednisone 40 mg for 5 days.  She does not need a prescription of albuterol or a spacer.  Will send home with a wait-and-see prescription of Augmentin and azithromycin for any pneumonia that may not be showing up on x-ray given her history of moderate asthma and age above 25.  No fluoroquinolone due to history of aortic dissection.  She is to follow-up with pulmonology.  Discussed labs, imaging, MDM, treatment plan, and plan for follow-up with patient. Discussed sn/sx that should prompt return to the ED. patient agrees with plan.   Meds ordered this encounter  Medications   acetaminophen (TYLENOL) tablet 650 mg   azithromycin (ZITHROMAX) 250 MG tablet    Sig: Take 1 tablet (250 mg total) by mouth daily. 2 tabs po on day 1, 1 tab po on days 2-5    Dispense:  6 tablet    Refill:  0   amoxicillin-clavulanate (AUGMENTIN) 875-125 MG tablet    Sig: Take 1 tablet by mouth every 12 (twelve) hours for 5 days.    Dispense:  10 tablet    Refill:  0   chlorpheniramine-HYDROcodone (TUSSIONEX) 10-8 MG/5ML    Sig: Take 5 mLs by mouth every 12 (twelve) hours as needed for cough.    Dispense:  60 mL    Refill:  0   predniSONE (DELTASONE) 20 MG tablet    Sig: Take 2 tablets (40 mg total) by mouth daily with breakfast for 5 days.    Dispense:  10 tablet    Refill:  0      *This clinic note was  created using Scientist, clinical (histocompatibility and immunogenetics). Therefore, there may be occasional mistakes despite careful proofreading.  ?    Domenick Gong,  MD 07/08/23 1404

## 2023-07-05 NOTE — ED Triage Notes (Addendum)
Sx started Monday  Sneezing  Low grade fever Productive cough- dark yellow mucus Headache Body aches  Did home covid test. Patient wants to be tested for Covid/flu.   Last dose of tylenol 4 am.

## 2023-07-05 NOTE — Discharge Instructions (Signed)
COVID, flu PCR testing is negative.  Chest x-ray is negative for any acute process.  Take two puffs from your albuterol inhaler every 4 hours for 2 days, then every 6 hours for 2 days, then as needed. You can back off if you start to fimprove  sooner. Finish the steroids unless your doctor tells you to stop.  You may take tylenol 1 gram up to 3 times a day as needed for pain. This with 600 mg of motrin is an effective combination for pain and fever. Make sure you drink extra fluids.   Wait-and-see prescription of azithromycin and and Augmentin.  If you do start it, finish them.  Saline nasal irrigation with a [employer] sinus rinse and distilled water as often as you want and Mucinex for the nasal congestion/chest congestion. Return to the ER if you get worse, have a fever >100.4, or any other concerns.    Go to www.goodrx.com  or www.costplusdrugs.com to look up your medications. This will give you a list of where you can find your prescriptions at the most affordable prices. Or ask the pharmacist what the cash price is, or if they have any other discount programs available to help make your medication more affordable. This can be less expensive than what you would pay with insurance.

## 2024-06-06 ENCOUNTER — Encounter: Payer: Self-pay | Admitting: Emergency Medicine

## 2024-06-06 ENCOUNTER — Ambulatory Visit (INDEPENDENT_AMBULATORY_CARE_PROVIDER_SITE_OTHER)

## 2024-06-06 ENCOUNTER — Ambulatory Visit
Admission: EM | Admit: 2024-06-06 | Discharge: 2024-06-06 | Disposition: A | Discharge status: Home / Self Care | Attending: Emergency Medicine | Admitting: Emergency Medicine

## 2024-06-06 DIAGNOSIS — S20212A Contusion of left front wall of thorax, initial encounter: Secondary | ICD-10-CM | POA: Diagnosis not present

## 2024-06-06 MED ORDER — HYDROCODONE-ACETAMINOPHEN 5-325 MG PO TABS
1.0000 | ORAL_TABLET | Freq: Four times a day (QID) | ORAL | 0 refills | Status: AC | PRN
Start: 2024-06-06 — End: ?
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
## 2024-06-06 NOTE — Discharge Instructions (Addendum)
 Your chest x-ray and rib films were read as negative for any acute fracture.  You do have the chronic left hemidiaphragm elevation as well as atelectasis in the left lower lung.  I suspect you most likely sustained a contusion to your chest wall as a result of the recent injury.  Use over-the-counter Tylenol  and/or ibuprofen  according to the package instructions as needed for mild to moderate pain.  You may also apply topical Salonpas or over-the-counter lidocaine patches to the area of pain.  Each patch is good for 8 hours.  I will plan to prescribe you some Norco that you can use as needed for more severe pain.  You may take 1 tablet every 6 hours as needed.  Be mindful this medication will sedate you so do not drink alcohol or drive if you take it.  Also do not operate any heavy machinery or sign legal documents.  Norco also contains Tylenol  so make sure that you are not taking more than 3000 mg of Tylenol  in total every 24 hours.  Take 10 deep breaths every hour to help with keep your lungs inflated and prevent pneumonia from forming.  If you develop any new or worsening symptoms please return for reevaluation or see your primary care provider.
